# Patient Record
Sex: Female | Born: 1980
Health system: Southern US, Community
[De-identification: ages and names within clinical notes are randomized; demographics above are authoritative.]

## PROBLEM LIST (undated history)

## (undated) DIAGNOSIS — J42 Unspecified chronic bronchitis: Secondary | ICD-10-CM

## (undated) DIAGNOSIS — R0602 Shortness of breath: Secondary | ICD-10-CM

## (undated) DIAGNOSIS — F319 Bipolar disorder, unspecified: Secondary | ICD-10-CM

## (undated) DIAGNOSIS — D509 Iron deficiency anemia, unspecified: Secondary | ICD-10-CM

## (undated) DIAGNOSIS — F209 Schizophrenia, unspecified: Secondary | ICD-10-CM

## (undated) DIAGNOSIS — D649 Anemia, unspecified: Secondary | ICD-10-CM

## (undated) DIAGNOSIS — J45909 Unspecified asthma, uncomplicated: Secondary | ICD-10-CM

## (undated) DIAGNOSIS — Z9289 Personal history of other medical treatment: Secondary | ICD-10-CM

## (undated) HISTORY — PX: NO PAST SURGERIES: SHX2092

## (undated) HISTORY — PX: HYSTERECTOMY ABDOMINAL WITH SALPINGECTOMY: SHX6725

---

## 2013-04-06 ENCOUNTER — Emergency Department (HOSPITAL_COMMUNITY): Payer: Self-pay

## 2013-04-06 ENCOUNTER — Inpatient Hospital Stay (HOSPITAL_COMMUNITY)
Admission: EM | Admit: 2013-04-06 | Discharge: 2013-04-08 | DRG: 761 | Disposition: A | Discharge status: Home / Self Care | Payer: Self-pay | Attending: Internal Medicine | Admitting: Internal Medicine

## 2013-04-06 ENCOUNTER — Encounter (HOSPITAL_COMMUNITY): Payer: Self-pay | Admitting: *Deleted

## 2013-04-06 DIAGNOSIS — N858 Other specified noninflammatory disorders of uterus: Secondary | ICD-10-CM

## 2013-04-06 DIAGNOSIS — J45909 Unspecified asthma, uncomplicated: Secondary | ICD-10-CM | POA: Diagnosis present

## 2013-04-06 DIAGNOSIS — D259 Leiomyoma of uterus, unspecified: Principal | ICD-10-CM | POA: Diagnosis present

## 2013-04-06 DIAGNOSIS — F319 Bipolar disorder, unspecified: Secondary | ICD-10-CM

## 2013-04-06 DIAGNOSIS — N920 Excessive and frequent menstruation with regular cycle: Secondary | ICD-10-CM | POA: Diagnosis present

## 2013-04-06 DIAGNOSIS — Z79899 Other long term (current) drug therapy: Secondary | ICD-10-CM

## 2013-04-06 DIAGNOSIS — R600 Localized edema: Secondary | ICD-10-CM

## 2013-04-06 DIAGNOSIS — M7989 Other specified soft tissue disorders: Secondary | ICD-10-CM

## 2013-04-06 DIAGNOSIS — D500 Iron deficiency anemia secondary to blood loss (chronic): Secondary | ICD-10-CM | POA: Diagnosis present

## 2013-04-06 DIAGNOSIS — A599 Trichomoniasis, unspecified: Secondary | ICD-10-CM

## 2013-04-06 DIAGNOSIS — D509 Iron deficiency anemia, unspecified: Secondary | ICD-10-CM

## 2013-04-06 DIAGNOSIS — R140 Abdominal distension (gaseous): Secondary | ICD-10-CM

## 2013-04-06 DIAGNOSIS — R599 Enlarged lymph nodes, unspecified: Secondary | ICD-10-CM | POA: Diagnosis present

## 2013-04-06 DIAGNOSIS — N852 Hypertrophy of uterus: Secondary | ICD-10-CM | POA: Diagnosis present

## 2013-04-06 HISTORY — DX: Iron deficiency anemia, unspecified: D50.9

## 2013-04-06 HISTORY — DX: Schizophrenia, unspecified: F20.9

## 2013-04-06 HISTORY — DX: Anemia, unspecified: D64.9

## 2013-04-06 HISTORY — DX: Unspecified asthma, uncomplicated: J45.909

## 2013-04-06 HISTORY — DX: Shortness of breath: R06.02

## 2013-04-06 HISTORY — DX: Bipolar disorder, unspecified: F31.9

## 2013-04-06 HISTORY — DX: Unspecified chronic bronchitis: J42

## 2013-04-06 HISTORY — DX: Personal history of other medical treatment: Z92.89

## 2013-04-06 LAB — URINALYSIS, ROUTINE W REFLEX MICROSCOPIC
Bilirubin Urine: NEGATIVE
Glucose, UA: NEGATIVE mg/dL
Hgb urine dipstick: NEGATIVE
Ketones, ur: NEGATIVE mg/dL
Protein, ur: NEGATIVE mg/dL

## 2013-04-06 LAB — URINE MICROSCOPIC-ADD ON

## 2013-04-06 LAB — POCT PREGNANCY, URINE: Preg Test, Ur: NEGATIVE

## 2013-04-06 NOTE — ED Provider Notes (Signed)
CSN: [id number]     Arrival date & time 04/06/13  2133 History   First MD Initiated Contact with Patient 04/06/13 2153     Chief Complaint  Patient presents with  . Leg Swelling   (Consider location/radiation/quality/duration/timing/severity/associated sxs/prior Treatment) HPI Comments: 89 y F with PMH of bipolar and asthma, here with leg swelling for the past 3 days.  No change in urination. No fevers, chills, SOB, cough, abd pain, n/v, diarrhea.  Patient is a 32 y.o. female presenting with leg pain. The history is provided by the patient.  Leg Pain Location:  Leg Leg location:  L lower leg and R lower leg Chronicity:  New Prior injury to area:  No Worsened by:  Nothing tried Associated symptoms: no back pain, no fever, no muscle weakness, no neck pain, no stiffness, no swelling and no tingling     Past Medical History  Diagnosis Date  . Asthma   . Bronchitis    History reviewed. No pertinent past surgical history. History reviewed. No pertinent family history. History  Substance Use Topics  . Smoking status: Never Smoker   . Smokeless tobacco: Not on file  . Alcohol Use: No   OB History   Grav Para Term Preterm Abortions TAB SAB Ect Mult Living                 Review of Systems  Constitutional: Negative for fever and chills.  HENT: Negative for congestion, sore throat, trouble swallowing, neck pain and neck stiffness.   Respiratory: Negative for cough and shortness of breath.   Cardiovascular: Negative for chest pain.  Gastrointestinal: Negative for nausea, vomiting, abdominal pain and diarrhea.  Endocrine: Negative for polydipsia and polyphagia.  Genitourinary: Negative for dysuria, frequency, vaginal bleeding and vaginal discharge.  Musculoskeletal: Negative for back pain, joint swelling and stiffness.  Skin: Negative for pallor and rash.  Neurological: Negative for syncope, light-headedness and headaches.  Hematological: Negative for adenopathy. Does not  bruise/bleed easily.  Psychiatric/Behavioral: Negative for suicidal ideas and self-injury.  All other systems reviewed and are negative.    Allergies  Review of patient's allergies indicates no known allergies.  Home Medications  No current outpatient prescriptions on file. BP 109/58  Pulse 82  Temp(Src) 98.6 F (37 C) (Oral)  Resp 20  SpO2 100%  LMP 03/23/2013 Physical Exam  Vitals reviewed. Constitutional: She is oriented to person, place, and time. She appears well-developed and well-nourished.  HENT:  Right Ear: External ear normal.  Left Ear: External ear normal.  Mouth/Throat: No oropharyngeal exudate.  Eyes: Conjunctivae and EOM are normal. Pupils are equal, round, and reactive to light.  Neck: Normal range of motion. Neck supple.  Cardiovascular: Normal rate, regular rhythm, normal heart sounds and intact distal pulses.  Exam reveals no gallop and no friction rub.   No murmur heard. Pulmonary/Chest: Effort normal. She has wheezes (occasional).  Abdominal: Soft. Bowel sounds are normal. She exhibits distension and mass (palpable mass in RLQ). There is no tenderness. There is no rebound and no guarding.  Musculoskeletal: Normal range of motion. She exhibits edema (2+ bilateral LEs).  Neurological: She is alert and oriented to person, place, and time.  Skin: Skin is warm and dry. No rash noted. She is not diaphoretic.  Psychiatric: She has a normal mood and affect.    ED Course  Procedures (including critical care time) Labs Review Labs Reviewed  URINALYSIS, ROUTINE W REFLEX MICROSCOPIC - Abnormal; Notable for the following:    APPearance CLOUDY (*)  Leukocytes, UA MODERATE (*)    All other components within normal limits  CBC WITH DIFFERENTIAL - Abnormal; Notable for the following:    RBC 2.84 (*)    Hemoglobin 4.6 (*)    HCT 17.3 (*)    MCV 60.9 (*)    MCH 16.2 (*)    MCHC 26.6 (*)    RDW 24.2 (*)    Platelets 769 (*)    Eosinophils Relative 8 (*)     All other components within normal limits  COMPREHENSIVE METABOLIC PANEL - Abnormal; Notable for the following:    Potassium 3.4 (*)    Albumin 3.1 (*)    Alkaline Phosphatase 32 (*)    Total Bilirubin 0.1 (*)    All other components within normal limits  PRO B NATRIURETIC PEPTIDE - Abnormal; Notable for the following:    Pro B Natriuretic peptide (BNP) 131.1 (*)    All other components within normal limits  URINE MICROSCOPIC-ADD ON - Abnormal; Notable for the following:    Squamous Epithelial / LPF FEW (*)    Bacteria, UA FEW (*)    All other components within normal limits  RETICULOCYTES - Abnormal; Notable for the following:    RBC. 2.86 (*)    All other components within normal limits  URINE CULTURE  PROTIME-INR  VITAMIN B12  FOLATE  IRON AND TIBC  FERRITIN  CBC  POCT PREGNANCY, URINE  OCCULT BLOOD, POC DEVICE  TYPE AND SCREEN  PREPARE RBC (CROSSMATCH)  ABO/RH   Imaging Review Ct Abdomen Pelvis Wo Contrast  04/07/2013   *RADIOLOGY REPORT*  Clinical Data: Leg swelling.  Abdominal distention.  CT ABDOMEN AND PELVIS WITHOUT CONTRAST  Technique:  Multidetector CT imaging of the abdomen and pelvis was performed following the standard protocol without intravenous contrast.  Comparison: Abdominal radiograph 04/06/2013  Findings: Lung bases:  The lung bases are clear.  Abdomen/pelvis:  The uterus is massively enlarged and contains innumerable soft tissue masses within it.  These masses most likely reflect multiple fibroids.  Three of the fibroids are at least partially calcified, the one in the right aspect of the uterus accounts for the calcification seen on today's abdominal radiograph.  The uterus measures approximately 28 cm in sagittal length, and extends approximately 7 cm superior to the patient's umbilicus.  Uterus measures up to 19 cm transverse diameter and up to 17 cm AP diameter, and nearly occupies the entire lower abdomen and upper pelvis.  The largest uterine mass is within  the inferior uterus and measures up to 9.7 x 11 centimeters.  Fluid density oval mass wit in the right posterior aspect of the uterus likely reflects a degenerating/infarcting fibroid, which can be a source of pain.  The enlarged uterus causes mass effect on the bowel loops, and likely causes mass effect upon the inferior vena cava and abdominal aorta.  It may be the cause of the patient's leg swelling. Due to the presence of mass in the absence of contrast, the abdominal aorta and vena cava not well visualized.  The liver, spleen, adrenal glands, pancreas, and kidneys show no abnormality on noncontrast imaging.  Urinary bladder is unremarkable.  Inguinal lymph nodes are mildly prominent bilaterally, measuring up to 14 x 14 mm on the right and 16 x 15 mm on the left.  Evaluation for intra-abdominal lymphadenopathy is limited given the lack of contrast and the large uterine size.  No acute osseous abnormality.  IMPRESSION:  1.  Massively enlarged uterus (approximately 28 x  19 x 17 cm) contains multiple masses, likel fibroids.  Given the large size of uterus and large size of the masses, sarcomatous degeneration of fibroids cannot be excluded.  The uterus extends 7 cm cephalad to the umbilicus and causes mass effect on the abdominal bowel loops, likely on the aorta and inferior vena cava, and may be causing the patient's leg swelling.  Gynecology consultation is recommended. 2.  Prominent inguinal lymph nodes bilaterally.   Original Report Authenticated By: Britta Mccreedy, M.D.   Ct Abdomen Pelvis Wo Contrast  04/06/2013   [name]     04/06/2013 11:40 PM Per Dr. Oleh Genin, ER resident, this exam was ordered due to  the pt having some abdominal distention and feels the pt has a  palpable mass.  The dr states that this is a screening and that  doing the exam without IV and/or oral contrast will be  sufficient.  Dg Chest 2 View  04/06/2013   *RADIOLOGY REPORT*  Clinical Data: Leg swelling  CHEST - 2 VIEW   Comparison: None.  Findings: Heart size appears upper normal to borderline enlarged. There is mild central pulmonary vascular congestion.  The lungs are clear.  Negative for airspace disease, effusion, or pneumothorax. Trachea is midline.  Visualized bowel gas pattern is normal.  No acute osseous abnormality.  IMPRESSION: Borderline cardiomegaly and probable mild central pulmonary vascular congestion.   Original Report Authenticated By: Britta Mccreedy, M.D.   Dg Abd 1 View  04/06/2013   *RADIOLOGY REPORT*  Clinical Data: Leg swelling  ABDOMEN - 1 VIEW  Comparison: Chest radiograph 04/06/2013  Findings: Relative paucity of bowel gas in the central abdomen, with loss of the normal psoas margins.  Cannot exclude ascites or abdominal/pelvic mass as the cause of these findings.  Additionally, there is an oval-shaped peripherallycalcified structure in the right lower quadrant that measures 7 x 6.3 cm.  No evidence of bowel obstruction.  IMPRESSION: Peripherally calcified right lower quadrant mass of uncertain etiology.  Paucity of bowel loops in the central abdomen, for which ascites or soft tissue mass cannot be excluded.  Suggest further evaluation with CT of the abdomen pelvis with IV and oral contrast.   Original Report Authenticated By: Britta Mccreedy, M.D.    MDM   89 y F with PMH of bipolar and asthma, here with leg swelling for the past 3 days.  No change in urination. No fevers, chills, SOB, cough, abd pain, n/v, diarrhea.  Afebrile.  VSS. Well appearing.  +abd distention, but soft, NT, no rebound.  No CVAT.  2+ pitting edema to the knees bilaterally.  Diff Dx: AKI, CHF, hypoalbuminemia, benign edema, abd mass, DVT.  CBC, CMP, bnp, XR chest, CT abd/pelvis  1:02 AM  Hgb 4.  FOBT negative, brown stool.  No vaginal bleeding, but she does report a long hx of heavy periods.  No signs of active bleeding.  She seems hemodynamically compensated.  Typed and crossed for 2U, no need for emergent transfusion.  KUB  had a calcified mass.  Subsequent CT abd/pelvis demonstrated an enlarged uterus and numerous large calcified fibroids.  Her lower extremity edema is felt to be 2/2 IVC compression. Medicine consulted for admission.   Clinical Impression: 1. Uterine mass   2. Leg swelling   3. Abdominal distention   4. Microcytic anemia      I have discussed the results, Dx and Tx plan. They understand and agree with plan for admission.  Exam unchanged at admission.  Pt seen in conjunction with Dr. Manus Gunning.  Reine Just. Beverely Pace, MD Emergency Medicine PGY-III [phone]   Oleh Genin, MD 04/07/13 [phone]

## 2013-04-06 NOTE — Procedures (Signed)
Per Dr. Oleh Genin, ER resident, this exam was ordered due to the pt having some abdominal distention and feels the pt has a palpable mass.  The dr states that this is a screening and that doing the exam without IV and/or oral contrast will be sufficient.

## 2013-04-06 NOTE — ED Notes (Signed)
Pt states she noticed her ankles were swollen when her socks felt tight. Bilateral ankle edema, non pitting. No wheeping noted. BLE pedal pulses 1+. Bilateral breath sounds clear. Pt abdomen distended. Pt states last BM was a few days ago. Bowel sounds active.

## 2013-04-06 NOTE — ED Notes (Addendum)
Pt states bilateral leg swelling for the past couple of days. 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 Pt denies decrease in urine output or known medical history for cause of swelling. Pt abdomen swelling and appears pregnant but states her LMP was 2 weeks ago.

## 2013-04-07 ENCOUNTER — Encounter (HOSPITAL_COMMUNITY): Payer: Self-pay | Admitting: Internal Medicine

## 2013-04-07 ENCOUNTER — Other Ambulatory Visit: Payer: Self-pay | Admitting: Obstetrics & Gynecology

## 2013-04-07 DIAGNOSIS — R0609 Other forms of dyspnea: Secondary | ICD-10-CM

## 2013-04-07 DIAGNOSIS — D509 Iron deficiency anemia, unspecified: Secondary | ICD-10-CM | POA: Diagnosis present

## 2013-04-07 DIAGNOSIS — N9489 Other specified conditions associated with female genital organs and menstrual cycle: Secondary | ICD-10-CM

## 2013-04-07 DIAGNOSIS — N858 Other specified noninflammatory disorders of uterus: Secondary | ICD-10-CM | POA: Diagnosis present

## 2013-04-07 DIAGNOSIS — R141 Gas pain: Secondary | ICD-10-CM

## 2013-04-07 DIAGNOSIS — F319 Bipolar disorder, unspecified: Secondary | ICD-10-CM | POA: Diagnosis present

## 2013-04-07 DIAGNOSIS — R0989 Other specified symptoms and signs involving the circulatory and respiratory systems: Secondary | ICD-10-CM

## 2013-04-07 DIAGNOSIS — R609 Edema, unspecified: Secondary | ICD-10-CM

## 2013-04-07 DIAGNOSIS — A599 Trichomoniasis, unspecified: Secondary | ICD-10-CM | POA: Diagnosis present

## 2013-04-07 DIAGNOSIS — Z9289 Personal history of other medical treatment: Secondary | ICD-10-CM

## 2013-04-07 DIAGNOSIS — R600 Localized edema: Secondary | ICD-10-CM | POA: Diagnosis present

## 2013-04-07 HISTORY — DX: Personal history of other medical treatment: Z92.89

## 2013-04-07 LAB — COMPREHENSIVE METABOLIC PANEL
ALT: 7 U/L (ref 0–35)
Alkaline Phosphatase: 32 U/L — ABNORMAL LOW (ref 39–117)
CO2: 25 mEq/L (ref 19–32)
Calcium: 8.4 mg/dL (ref 8.4–10.5)
GFR calc Af Amer: >90 mL/min (ref >90)
GFR calc non Af Amer: >90 mL/min (ref >90)
Glucose, Bld: 94 mg/dL (ref 70–99)
Sodium: 137 mEq/L (ref 135–145)

## 2013-04-07 LAB — CBC
HCT: 31.3 % — ABNORMAL LOW (ref 36.0–46.0)
Hemoglobin: 6.7 g/dL — CL (ref 12.0–15.0)
MCH: 16 pg — ABNORMAL LOW (ref 26.0–34.0)
MCH: 19.8 pg — ABNORMAL LOW (ref 26.0–34.0)
MCHC: 29.4 g/dL — ABNORMAL LOW (ref 30.0–36.0)
MCHC: 31.6 g/dL (ref 30.0–36.0)
Platelets: 766 10*3/uL — ABNORMAL HIGH (ref 150–400)
Platelets: 705 10*3/uL — ABNORMAL HIGH (ref 150–400)
Platelets: 720 10*3/uL — ABNORMAL HIGH (ref 150–400)
RBC: 2.68 MIL/uL — ABNORMAL LOW (ref 3.87–5.11)
RDW: 23.9 % — ABNORMAL HIGH (ref 11.5–15.5)
RDW: 27.1 % — ABNORMAL HIGH (ref 11.5–15.5)
RDW: 25.1 % — ABNORMAL HIGH (ref 11.5–15.5)

## 2013-04-07 LAB — CBC WITH DIFFERENTIAL/PLATELET
Basophils Relative: 1 % (ref 0–1)
Eosinophils Absolute: 0.4 10*3/uL (ref 0.0–0.7)
HCT: 17.3 % — ABNORMAL LOW (ref 36.0–46.0)
Hemoglobin: 4.6 g/dL — CL (ref 12.0–15.0)
Lymphocytes Relative: 32 % (ref 12–46)
Lymphs Abs: 1.6 10*3/uL (ref 0.7–4.0)
MCH: 16.2 pg — ABNORMAL LOW (ref 26.0–34.0)
MCHC: 26.6 g/dL — ABNORMAL LOW (ref 30.0–36.0)
MCV: 60.9 fL — ABNORMAL LOW (ref 78.0–100.0)
Neutro Abs: 2.5 10*3/uL (ref 1.7–7.7)

## 2013-04-07 LAB — PROTIME-INR
INR: 1.11 (ref 0.00–1.49)
Prothrombin Time: 14.1 seconds (ref 11.6–15.2)

## 2013-04-07 LAB — OCCULT BLOOD, POC DEVICE: Fecal Occult Bld: NEGATIVE

## 2013-04-07 LAB — ABO/RH: ABO/RH(D): A POS

## 2013-04-07 LAB — FERRITIN: Ferritin: 1 ng/mL — ABNORMAL LOW (ref 10–291)

## 2013-04-07 LAB — PREPARE RBC (CROSSMATCH)

## 2013-04-07 LAB — RETICULOCYTES
RBC.: 2.86 MIL/uL — ABNORMAL LOW (ref 3.87–5.11)
Retic Count, Absolute: 34.3 10*3/uL (ref 19.0–186.0)

## 2013-04-07 LAB — IRON AND TIBC: Iron: <10 ug/dL — ABNORMAL LOW (ref 42–135)

## 2013-04-07 LAB — FOLATE: Folate: >20 ng/mL

## 2013-04-07 MED ORDER — LURASIDONE HCL 40 MG PO TABS
40.0000 mg | ORAL_TABLET | Freq: Every day | ORAL | Status: DC
  Administered 2013-04-07 – 2013-04-08 (×2): 40 mg via ORAL
  Filled 2013-04-07 (×3): qty 1

## 2013-04-07 MED ORDER — PNEUMOCOCCAL VAC POLYVALENT 25 MCG/0.5ML IJ INJ
0.5000 mL | INJECTION | INTRAMUSCULAR | Status: AC
  Administered 2013-04-08: 11:00:00 0.5 mL via INTRAMUSCULAR
  Filled 2013-04-07: qty 0.5

## 2013-04-07 MED ORDER — ALBUTEROL SULFATE HFA 108 (90 BASE) MCG/ACT IN AERS: 2.0000 | INHALATION_SPRAY | Freq: Four times a day (QID) | RESPIRATORY_TRACT | Status: DC | PRN

## 2013-04-07 MED ORDER — METRONIDAZOLE 500 MG PO TABS
2000.0000 mg | ORAL_TABLET | Freq: Once | ORAL | Status: AC
  Administered 2013-04-07: 2000 mg via ORAL
  Filled 2013-04-07 (×2): qty 4

## 2013-04-07 MED ORDER — SODIUM CHLORIDE 0.9 % IV SOLN: 250.0000 mL | INTRAVENOUS | Status: DC | PRN

## 2013-04-07 MED ORDER — SODIUM CHLORIDE 0.9 % IJ SOLN: 3.0000 mL | INTRAMUSCULAR | Status: DC | PRN

## 2013-04-07 MED ORDER — SODIUM CHLORIDE 0.9 % IJ SOLN
3.0000 mL | Freq: Two times a day (BID) | INTRAMUSCULAR | Status: DC
  Administered 2013-04-07 – 2013-04-08 (×3): 3 mL via INTRAVENOUS

## 2013-04-07 MED ORDER — FERUMOXYTOL INJECTION 510 MG/17 ML
510.0000 mg | Freq: Once | INTRAVENOUS | Status: AC
  Administered 2013-04-07: 13:00:00 510 mg via INTRAVENOUS
  Filled 2013-04-07: qty 17

## 2013-04-07 MED ORDER — SODIUM CHLORIDE 0.9 % IJ SOLN
3.0000 mL | Freq: Two times a day (BID) | INTRAMUSCULAR | Status: DC
  Administered 2013-04-07 – 2013-04-08 (×2): 3 mL via INTRAVENOUS

## 2013-04-07 NOTE — ED Notes (Signed)
Critical hemoglobin 4.6 reported to Dr. Beverely Pace

## 2013-04-07 NOTE — ED Notes (Signed)
Hand off report called to [facility].

## 2013-04-07 NOTE — Progress Notes (Signed)
*  PRELIMINARY RESULTS* Echocardiogram 2D Echocardiogram has been performed.  Sherri Shaw 04/07/2013, 10:41 AM

## 2013-04-07 NOTE — Progress Notes (Signed)
Utilization Review Completed.   Faydra Korman, RN, BSN Nurse Case Manager  [phone]  

## 2013-04-07 NOTE — ED Notes (Signed)
Blood transfusing without difficulty. Verified with JLO RN. No signs or symptoms of a transfusion reaction present.

## 2013-04-07 NOTE — H&P (Signed)
Date: 04/07/2013               Patient Name:  Sherri Shaw MRN: [id number]  DOB: 07/25/1981 Age / Sex: 32 y.o., female   PCP: No Pcp Per Patient         Medical Service: Internal Medicine Teaching Service         Attending Physician: Dr. Bonnetta Barry att. providers found    First Contact: Dr. Jannifer Hick, MD Pager: [phone]  Second Contact: Dr. Charlsie Merles, MD Pager: [phone]       After Hours (After 5p/  First Contact Pager: [phone]  weekends / holidays): Second Contact Pager: [phone]   Chief Complaint: Leg Swelling  History of Present Illness: Sherri Shaw is a 32 y.o. female with a history of asthma and bipolar d/o who comes to the ED with a CC of leg swelling. The interview was made challenging by the patients child like affect and lack of insight. The history was corroborated with her sister. The patient reports a couple day history of leg swelling, that was much worse today. The patient notcied this morning that she could not get her shoes on. She has an associated symptom of 4 years of abdominal pain and progressive distension. She admits exertional dyspnea and generalized weakness. She admits to heavy menstrual flow 1 x per month for 7 days. She denies vaginal bleeding, hematachezia. She denies N/V, hematemesis, constipation, diarrhea, SOB, chest pain. She denies alcohol, drug use, tobacco use. She denies sexual activity.  She reports an extensive family history of stomach tumors, but does not know much about it.  Meds: No current facility-administered medications for this encounter.   Current Outpatient Prescriptions  Medication Sig Dispense Refill  . albuterol (PROVENTIL HFA;VENTOLIN HFA) 108 (90 BASE) MCG/ACT inhaler Inhale 2 puffs into the lungs every 6 (six) hours as needed for wheezing.      Marland Kitchen lurasidone (LATUDA) 40 MG TABS tablet Take 40 mg by mouth daily with breakfast.        Allergies: Allergies as of 04/06/2013  . (No Known Allergies)   Past Medical History    Diagnosis Date  . Asthma   . Bronchitis   . Bipolar disorder    History reviewed. No pertinent past surgical history. Family History  Problem Relation Age of Onset  . Hypertension Mother   . Diabetes Mother   . Asthma Mother   . Asthma Brother    History   Social History  . Marital Status: Single    Spouse Name: N/A    Number of Children: N/A  . Years of Education: Coll   Occupational History  . Not on file.   Social History Main Topics  . Smoking status: Never Smoker   . Smokeless tobacco: Not on file  . Alcohol Use: No  . Drug Use: No  . Sexual Activity: Not Currently   Other Topics Concern  . 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  Physical Exam: Blood pressure 111/63, pulse 82, temperature 98.1 F (36.7 C), temperature source Oral, resp. rate 16, last menstrual period 03/23/2013, SpO2 91.00%. Physical Exam  Constitutional: She appears well-developed and well-nourished. No distress.  HENT:  Head: Normocephalic and atraumatic.  Mouth/Throat: Oropharynx is clear and moist. No oropharyngeal exudate.  Eyes: EOM are normal. Pupils are equal, round, and reactive to  light.  Cardiovascular: Normal rate, regular rhythm, normal heart sounds and intact distal pulses.  Exam reveals no gallop and no friction rub.   No murmur heard. Pulmonary/Chest: Effort normal.  Mild wheezing bil.  Abdominal: Bowel sounds are normal. She exhibits distension. There is no tenderness. There is no rebound and no guarding.  Distended, multiple masses felt in the RUQ, RLQ. Nontender to palpation, no guarding or rebound.  Musculoskeletal: She exhibits no edema.  2+ LE edema bilateral  Neurological: She is alert.  Skin: She is not diaphoretic.  Psychiatric:  Child like affect, poor comprehension and poor insight.     Lab results: Basic  Metabolic Panel:  Recent Labs  16/10/96 2322  NA 137  K 3.4*  CL 104  CO2 25  GLUCOSE 94  BUN 8  CREATININE 0.69  CALCIUM 8.4   Liver Function Tests:  Recent Labs  04/06/13 2322  AST 14  ALT 7  ALKPHOS 32*  BILITOT 0.1*  PROT 7.1  ALBUMIN 3.1*   CBC:  Recent Labs  04/06/13 2322 04/07/13 0106  WBC 5.1 5.8  NEUTROABS 2.5  --   HGB 4.6* 4.3*  HCT 17.3* 16.3*  MCV 60.9* 60.8*  PLT 769* 766*   BNP:  Recent Labs  04/06/13 2322  PROBNP 131.1*   Anemia Panel:  Recent Labs  04/07/13 0005  RETICCTPCT 1.2   Coagulation:  Recent Labs  04/07/13 0005  LABPROT 14.1  INR 1.11   Urinalysis:  Recent Labs  04/06/13 2155  COLORURINE YELLOW  LABSPEC 1.020  PHURINE 7.5  GLUCOSEU NEGATIVE  HGBUR NEGATIVE  BILIRUBINUR NEGATIVE  KETONESUR NEGATIVE  PROTEINUR NEGATIVE  UROBILINOGEN 1.0  NITRITE NEGATIVE  LEUKOCYTESUR MODERATE*   Imaging results:  Ct Abdomen Pelvis Wo Contrast  04/07/2013   *RADIOLOGY REPORT*  Clinical Data: Leg swelling.  Abdominal distention.  CT ABDOMEN AND PELVIS WITHOUT CONTRAST  Technique:  Multidetector CT imaging of the abdomen and pelvis was performed following the standard protocol without intravenous contrast.  Comparison: Abdominal radiograph 04/06/2013  Findings: Lung bases:  The lung bases are clear.  Abdomen/pelvis:  The uterus is massively enlarged and contains innumerable soft tissue masses within it.  These masses most likely reflect multiple fibroids.  Three of the fibroids are at least partially calcified, the one in the right aspect of the uterus accounts for the calcification seen on today's abdominal radiograph.  The uterus measures approximately 28 cm in sagittal length, and extends approximately 7 cm superior to the patient's umbilicus.  Uterus measures up to 19 cm transverse diameter and up to 17 cm AP diameter, and nearly occupies the entire lower abdomen and upper pelvis.  The largest uterine mass is within the inferior  uterus and measures up to 9.7 x 11 centimeters.  Fluid density oval mass wit in the right posterior aspect of the uterus likely reflects a degenerating/infarcting fibroid, which can be a source of pain.  The enlarged uterus causes mass effect on the bowel loops, and likely causes mass effect upon the inferior vena cava and abdominal aorta.  It may be the cause of the patient's leg swelling. Due to the presence of mass in the absence of contrast, the abdominal aorta and vena cava not well visualized.  The liver, spleen, adrenal glands, pancreas, and kidneys show no abnormality on noncontrast imaging.  Urinary bladder is unremarkable.  Inguinal lymph nodes are mildly prominent bilaterally, measuring up to 14 x 14 mm on the right and 16 x 15 mm on  the left.  Evaluation for intra-abdominal lymphadenopathy is limited given the lack of contrast and the large uterine size.  No acute osseous abnormality.  IMPRESSION:  1.  Massively enlarged uterus (approximately 28 x 19 x 17 cm) contains multiple masses, likel fibroids.  Given the large size of uterus and large size of the masses, sarcomatous degeneration of fibroids cannot be excluded.  The uterus extends 7 cm cephalad to the umbilicus and causes mass effect on the abdominal bowel loops, likely on the aorta and inferior vena cava, and may be causing the patient's leg swelling.  Gynecology consultation is recommended. 2.  Prominent inguinal lymph nodes bilaterally.   Original Report Authenticated By: Britta Mccreedy, M.D.   Ct Abdomen Pelvis Wo Contrast  04/06/2013   [name]     04/06/2013 11:40 PM Per Dr. Oleh Genin, ER resident, this exam was ordered due to  the pt having some abdominal distention and feels the pt has a  palpable mass.  The dr states that this is a screening and that  doing the exam without IV and/or oral contrast will be  sufficient.  Dg Chest 2 View  04/06/2013   *RADIOLOGY REPORT*  Clinical Data: Leg swelling  CHEST - 2 VIEW  Comparison:  None.  Findings: Heart size appears upper normal to borderline enlarged. There is mild central pulmonary vascular congestion.  The lungs are clear.  Negative for airspace disease, effusion, or pneumothorax. Trachea is midline.  Visualized bowel gas pattern is normal.  No acute osseous abnormality.  IMPRESSION: Borderline cardiomegaly and probable mild central pulmonary vascular congestion.   Original Report Authenticated By: Britta Mccreedy, M.D.   Dg Abd 1 View  04/06/2013   *RADIOLOGY REPORT*  Clinical Data: Leg swelling  ABDOMEN - 1 VIEW  Comparison: Chest radiograph 04/06/2013  Findings: Relative paucity of bowel gas in the central abdomen, with loss of the normal psoas margins.  Cannot exclude ascites or abdominal/pelvic mass as the cause of these findings.  Additionally, there is an oval-shaped peripherallycalcified structure in the right lower quadrant that measures 7 x 6.3 cm.  No evidence of bowel obstruction.  IMPRESSION: Peripherally calcified right lower quadrant mass of uncertain etiology.  Paucity of bowel loops in the central abdomen, for which ascites or soft tissue mass cannot be excluded.  Suggest further evaluation with CT of the abdomen pelvis with IV and oral contrast.   Original Report Authenticated By: Britta Mccreedy, M.D.   Assessment & Plan by Problem: Principal Problem:   Microcytic anemia Active Problems:   Uterine mass   Trichomonas   Bipolar disorder  # Microcytic Anemia The patient denies any evidence of bleeding aside from monthly heavy periods. The patients Hg is 4.3 and she is hemodynamically stable. The anemia is may represent chronic anemia due to chronic blood loss 2/2 her uterine masses. As the retic index of 0.17 was innappropriatley low, iron deficiency is likely, but thalassemia remains a possibility. Hemolysis is unlikely as ho blood by UA and no elevation of T Bili.  - x-fuse 2 U PRBC and recheck Hg in AM - F/U Iron Studies  # LE swelling The patients LE is  likely 2/2 compression of IVC by uterine masses. However, there is borderline cardiomegaly with mild pulmonary vascular congestion. DVT is unlikely given bil (Wells Risk of DVT = 1). - F/U 2D Echo  #Uterine Mass c/b bil LE swelling The patients uterine masses require further evaluation as they may be benign or malignant. Furthermore, the  mass effect produces is causing IVC compression leading to LE swelling. - Plan to consult Gyn in AM  # Trichomonas - 2 g Flagyl once  # Bipolar - Continue home Latuda  # Asthma - Continue home albuterol Dispo: Disposition is deferred at this time, awaiting improvement of current medical problems. Anticipated discharge in approximately 1-2 day(s).   The patient does not have a current PCP (No Pcp Per Patient) and does need an [facility] hospital follow-up appointment after discharge.  The patient does not know have transportation limitations that hinder transportation to clinic appointments.  Signed: Pleas Koch, MD 04/07/2013, 1:47 AM

## 2013-04-07 NOTE — Progress Notes (Signed)
The patient arrived to 4E16 from the ED at 0300.  She was oriented to the unit and placed on telemetry.  VS were taken and the patient was assessed.  She was A&Ox4 and did not have any complaints of pain at this time. A unit of blood was already being transfused when she arrived to the unit.  Her blood consent that was obtained in the ED was placed in the chart.  The call bell was explained to the patient and placed within reach.

## 2013-04-07 NOTE — ED Provider Notes (Signed)
I saw and evaluated the patient, reviewed the resident's note and I agree with the findings and plan. If applicable, I agree with the resident's interpretation of the EKG.  If applicable, I was present for critical portions of any procedures performed.  Bilateral leg swelling x 3days.  No chest pain or SOB. No distress, CTAB. Distended abdomen with multiple palpable masses. Nontender. +1 pretibial edema bilaterally, intact DP and PT pulses Anemia with massive uterus and fibroids. Vitals stable, no indication for emergent transfusion.  Glynn Octave, MD 04/07/13 [phone]

## 2013-04-07 NOTE — Progress Notes (Signed)
Subjective: Patient feels well this morning. She has no complaints other than the BLE edema and her BLE feeling "tight" when she walks. Reports that she first noticed her abdomen increasing in girth in 2010, has been stable in size for approx 4 years. She also noticed her periods became heavier and last longer (increased in length from 5 days on avg. To 8-10 days on avg). She has never been evaluated by OB/GYN. Denies SOB, CP, N/V, abd pain, lightheadedness.  Objective: Vital signs in last 24 hours: Filed Vitals:   04/07/13 1432 04/07/13 1504 04/07/13 1602 04/07/13 1630  BP: 93/47 97/50 105/55 103/40  Pulse: 73 72 72 74  Temp: 98.5 F (36.9 C) 98.1 F (36.7 C) 98.7 F (37.1 C) 98.7 F (37.1 C)  TempSrc: Oral Oral Oral Oral  Resp: 14 16 16 16   Height:      Weight:      SpO2: 98%      Weight change:   Intake/Output Summary (Last 24 hours) at 04/07/13 1713 Last data filed at 04/07/13 1700  Gross per 24 hour  Intake  627.5 ml  Output      0 ml  Net  627.5 ml   Physical Exam General: alert, cooperative, and in no apparent distress; answers questions with brief responses, but appropriately HEENT: pupils equal round and reactive to light, vision grossly intact, oropharynx clear and non-erythematous  Neck: supple, no lymphadenopathy, JVD, or carotid bruits Lungs: clear to ascultation bilaterally, normal work of respiration, no wheezes, rales, ronchi Heart: regular rate and rhythm, no murmurs, gallops, or rubs Abdomen: firm, distended, non-tender, with palpable masses to RUQ and RLQ, normal bowel sounds Extremities: warm extremities, 2+ pitting edema to BLE, no skin changes to BLE Neurologic: alert & oriented X3, strength and sensation grossly intact; affect is blunted  Lab Results: Basic Metabolic Panel:  Recent Labs Lab 04/06/13 2322  NA 137  K 3.4*  CL 104  CO2 25  GLUCOSE 94  BUN 8  CREATININE 0.69  CALCIUM 8.4   Liver Function Tests:  Recent Labs Lab  04/06/13 2322  AST 14  ALT 7  ALKPHOS 32*  BILITOT 0.1*  PROT 7.1  ALBUMIN 3.1*   CBC:  Recent Labs Lab 04/06/13 2322 04/07/13 0106 04/07/13 1145  WBC 5.1 5.8 6.3  NEUTROABS 2.5  --   --   HGB 4.6* 4.3* 6.7*  HCT 17.3* 16.3* 22.8*  MCV 60.9* 60.8* 67.5*  PLT 769* 766* 705*   BNP:  Recent Labs Lab 04/06/13 2322  PROBNP 131.1*   D-Dimer:  Recent Labs Lab 04/07/13 1145  DDIMER 1.10*   Coagulation:  Recent Labs Lab 04/07/13 0005  LABPROT 14.1  INR 1.11   Anemia Panel:  Recent Labs Lab 04/07/13 0005  VITAMINB12 537  FOLATE >20.0  FERRITIN 1*  TIBC Not calculated due to Iron <10.  IRON <10*  RETICCTPCT 1.2   Urinalysis:  Recent Labs Lab 04/06/13 2155  COLORURINE YELLOW  LABSPEC 1.020  PHURINE 7.5  GLUCOSEU NEGATIVE  HGBUR NEGATIVE  BILIRUBINUR NEGATIVE  KETONESUR NEGATIVE  PROTEINUR NEGATIVE  UROBILINOGEN 1.0  NITRITE NEGATIVE  LEUKOCYTESUR MODERATE*   Studies/Results: Ct Abdomen Pelvis Wo Contrast  04/07/2013   *RADIOLOGY REPORT*  Clinical Data: Leg swelling.  Abdominal distention.  CT ABDOMEN AND PELVIS WITHOUT CONTRAST  Technique:  Multidetector CT imaging of the abdomen and pelvis was performed following the standard protocol without intravenous contrast.  Comparison: Abdominal radiograph 04/06/2013  Findings: Lung bases:  The lung  bases are clear.  Abdomen/pelvis:  The uterus is massively enlarged and contains innumerable soft tissue masses within it.  These masses most likely reflect multiple fibroids.  Three of the fibroids are at least partially calcified, the one in the right aspect of the uterus accounts for the calcification seen on today's abdominal radiograph.  The uterus measures approximately 28 cm in sagittal length, and extends approximately 7 cm superior to the patient's umbilicus.  Uterus measures up to 19 cm transverse diameter and up to 17 cm AP diameter, and nearly occupies the entire lower abdomen and upper pelvis.  The  largest uterine mass is within the inferior uterus and measures up to 9.7 x 11 centimeters.  Fluid density oval mass wit in the right posterior aspect of the uterus likely reflects a degenerating/infarcting fibroid, which can be a source of pain.  The enlarged uterus causes mass effect on the bowel loops, and likely causes mass effect upon the inferior vena cava and abdominal aorta.  It may be the cause of the patient's leg swelling. Due to the presence of mass in the absence of contrast, the abdominal aorta and vena cava not well visualized.  The liver, spleen, adrenal glands, pancreas, and kidneys show no abnormality on noncontrast imaging.  Urinary bladder is unremarkable.  Inguinal lymph nodes are mildly prominent bilaterally, measuring up to 14 x 14 mm on the right and 16 x 15 mm on the left.  Evaluation for intra-abdominal lymphadenopathy is limited given the lack of contrast and the large uterine size.  No acute osseous abnormality.  IMPRESSION:  1.  Massively enlarged uterus (approximately 28 x 19 x 17 cm) contains multiple masses, likel fibroids.  Given the large size of uterus and large size of the masses, sarcomatous degeneration of fibroids cannot be excluded.  The uterus extends 7 cm cephalad to the umbilicus and causes mass effect on the abdominal bowel loops, likely on the aorta and inferior vena cava, and may be causing the patient's leg swelling.  Gynecology consultation is recommended. 2.  Prominent inguinal lymph nodes bilaterally.   Original Report Authenticated By: Britta Mccreedy, M.D.   Ct Abdomen Pelvis Wo Contrast  04/06/2013   [name]     04/06/2013 11:40 PM Per Dr. Oleh Genin, ER resident, this exam was ordered due to  the pt having some abdominal distention and feels the pt has a  palpable mass.  The dr states that this is a screening and that  doing the exam without IV and/or oral contrast will be  sufficient.  Dg Chest 2 View  04/06/2013   *RADIOLOGY REPORT*  Clinical  Data: Leg swelling  CHEST - 2 VIEW  Comparison: None.  Findings: Heart size appears upper normal to borderline enlarged. There is mild central pulmonary vascular congestion.  The lungs are clear.  Negative for airspace disease, effusion, or pneumothorax. Trachea is midline.  Visualized bowel gas pattern is normal.  No acute osseous abnormality.  IMPRESSION: Borderline cardiomegaly and probable mild central pulmonary vascular congestion.   Original Report Authenticated By: Britta Mccreedy, M.D.   Dg Abd 1 View  04/06/2013   *RADIOLOGY REPORT*  Clinical Data: Leg swelling  ABDOMEN - 1 VIEW  Comparison: Chest radiograph 04/06/2013  Findings: Relative paucity of bowel gas in the central abdomen, with loss of the normal psoas margins.  Cannot exclude ascites or abdominal/pelvic mass as the cause of these findings.  Additionally, there is an oval-shaped peripherallycalcified structure in the right lower quadrant that  measures 7 x 6.3 cm.  No evidence of bowel obstruction.  IMPRESSION: Peripherally calcified right lower quadrant mass of uncertain etiology.  Paucity of bowel loops in the central abdomen, for which ascites or soft tissue mass cannot be excluded.  Suggest further evaluation with CT of the abdomen pelvis with IV and oral contrast.   Original Report Authenticated By: Britta Mccreedy, M.D.   Medications: I have reviewed the patient's current medications. Scheduled Meds: . lurasidone  40 mg Oral Q breakfast  . sodium chloride  3 mL Intravenous Q12H  . sodium chloride  3 mL Intravenous Q12H   Continuous Infusions:  PRN Meds:.sodium chloride, albuterol, sodium chloride  Assessment/Plan:  # Microcytic Anemia  The patient denies any evidence of bleeding aside from monthly heavy periods. On admission her Hb was 4.3, but she was asymptomatic and hemodynamically stable. 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 However, there is borderline cardiomegaly with mild pulmonary vascular congestion. DVT is unlikely given bil (Wells Risk of DVT = 1), however D dimer is positive and we are unable to definitively rule out IVC thrombus.  - Echo 9/2- normal systolic function, normal Echo - MRI pelvis and abd w/ and w/o contrast to eval for IVC thrombus  #Uterine Mass c/b bil LE swelling  CT showed massively enlarged uterus w/ multiple masses likely representing fibroids, though sarcomatous degeneration was not ruled out. The mass effect of her uterus may be causing IVC compression leading to LE swelling.  - GYN consulted [phone])- I spoke with GYN and they recommend obtaining MRI abd/pelvis to eval for sarcomatous degeneration of patient's fibroids; additionally, they usually eval this type of uterine pathology in the outpatient setting as long as patient is medically stable; I also talked to radiology who stated that this MRI study would be helpful for evaluating for IVC thrombus as well  # Trichomonas  - 2 g Flagyl once   # Bipolar  - Continue home Latuda   # Asthma  - Continue home albuterol  Dispo: Disposition is deferred at this time, awaiting improvement of current medical problems.  Anticipated discharge in approximately 1 day(s).   The patient does not have a current PCP (No Pcp Per Patient) and does need an [facility] hospital follow-up appointment after discharge.  The patient does not have transportation limitations that hinder transportation to clinic appointments.  .Services Needed at time of discharge: Y = Yes, Blank = No PT:   OT:   RN:   Equipment:   Other:     LOS: 1 day   Windell Hummingbird, MD 04/07/2013, 5:13 PM

## 2013-04-07 NOTE — H&P (Signed)
INTERNAL MEDICINE TEACHING SERVICE Attending Admission Note  Date: 04/07/2013  Patient name: Sherri Shaw  Medical record number: [id number]  Date of birth: 1980-11-06    I have seen and evaluated Virgina Jock and discussed their care with the Residency Team.   32 yr old AAF w/ hx bipolar disorder and asthma presented due to worsening LE edema.  The patient has had progressive abdominal distention for years associated with her leg swelling. She states what made her come to the hospital was that she "couldn't walk" and was "in pain". Abdominal exam is significant for a palpable uterus with multiple masses. She has 2+ bilat pitting edema to her knees.  CT of her abdomen has multiple uterine masses with a massively enlarged uterus, as well as surrounding lymphadenopathy.  In addition, she was noted to have severe iron deficiency anemia, likely a result of menstrual losses.  She was transfused 2 Units of PRBCs. She  Consult Gyn at this time for recs. Concern is degeneration of fibroids into sarcomatous lesions. of Given her edema of LE, this could be due to IVC mass effect vs DVT.  Obtain a D-dimer, if positive, will need imaging of IVC and LE to r/o DVT.  Jonah Blue, DO, FACP Faculty [facility] Internal Medicine Residency Program 04/07/2013, 12:49 PM

## 2013-04-07 NOTE — Progress Notes (Signed)
A second unit of blood is transfusing without difficulty.  The patient's orthostatic vital signs were taken and are charted starting at 0508.  They are borderline positive.  An EKG was obtained and placed in the patient's chart.

## 2013-04-08 ENCOUNTER — Inpatient Hospital Stay (HOSPITAL_COMMUNITY): Payer: MEDICAID

## 2013-04-08 ENCOUNTER — Inpatient Hospital Stay (HOSPITAL_COMMUNITY): Payer: Self-pay

## 2013-04-08 LAB — TYPE AND SCREEN
Unit division: 0
Unit division: 0

## 2013-04-08 LAB — URINE CULTURE: Colony Count: >=100000

## 2013-04-08 LAB — BASIC METABOLIC PANEL
BUN: 8 mg/dL (ref 6–23)
Calcium: 8.5 mg/dL (ref 8.4–10.5)
Creatinine, Ser: 0.71 mg/dL (ref 0.50–1.10)
GFR calc Af Amer: >90 mL/min (ref >90)
GFR calc non Af Amer: >90 mL/min (ref >90)

## 2013-04-08 LAB — CBC
HCT: 28.7 % — ABNORMAL LOW (ref 36.0–46.0)
MCH: 22 pg — ABNORMAL LOW (ref 26.0–34.0)
MCHC: 31.7 g/dL (ref 30.0–36.0)
MCV: 69.3 fL — ABNORMAL LOW (ref 78.0–100.0)
RDW: 25.4 % — ABNORMAL HIGH (ref 11.5–15.5)

## 2013-04-08 MED ORDER — GADOBENATE DIMEGLUMINE 529 MG/ML IV SOLN
15.0000 mL | Freq: Once | INTRAVENOUS | Status: AC
  Administered 2013-04-08: 08:00:00 12 mL via INTRAVENOUS

## 2013-04-08 NOTE — Discharge Summary (Signed)
  Date: 04/08/2013  Patient name: Sherri Shaw  Medical record number: [id number]  Date of birth: Jan 28, 1981   This patient has been seen and the plan of care was discussed with the house staff. Please see their note for complete details. I concur with their findings and plan.  Jonah Blue, DO, FACP Faculty [facility] Internal Medicine Residency Program 04/08/2013, 3:53 PM

## 2013-04-08 NOTE — Progress Notes (Signed)
The patient did not have any complaints this morning and she did not have any acute changes overnight.  Her Hgb level after the two blood transfusions during the day was 9.9.

## 2013-04-08 NOTE — Discharge Summary (Signed)
Name: Sherri Shaw MRN: [id number] DOB: 07-11-1981 32 y.o. PCP: No Pcp Per Patient  Date of Admission: 04/06/2013  9:45 PM Date of Discharge: 04/08/2013 Attending Physician: Dr. Kem Kays  Discharge Diagnosis: Principal Problem:   Microcytic anemia Active Problems:   Uterine mass   Trichomonas   Bipolar disorder   Bilateral lower extremity edema  Discharge Medications:   Medication List         albuterol 108 (90 BASE) MCG/ACT inhaler  Commonly known as:  PROVENTIL HFA;VENTOLIN HFA  Inhale 2 puffs into the lungs every 6 (six) hours as needed for wheezing.     lurasidone 40 MG Tabs tablet  Commonly known as:  LATUDA  Take 40 mg by mouth daily with breakfast.        Disposition and follow-up:   [name] was discharged from [facility] in Good condition.  At the hospital follow up visit please address:  1.  Microcytic anemia- please reevaluate 2.  Uterine fibroids- please make sure patient followed up with GYN  2.  Labs / imaging needed at time of follow-up: CBC  3.  Pending labs/ test needing follow-up: none  Follow-up Appointments: Follow-up Information   Follow up with Sherri Rosenthal, MD On 04/10/2013. (9:45am)    Specialty:  Obstetrics and Gynecology   Contact information:   [address] [place] [address] [phone]       Discharge Instructions: Discharge Orders   Future Appointments Provider Department Dept Phone   04/10/2013 9:45 AM Sherri Rosenthal, MD [facility] [phone]   Future Orders Complete By Expires   Call MD for:  persistant nausea and vomiting  As directed    Call MD for:  severe uncontrolled pain  As directed    Diet - low sodium heart healthy  As directed    Increase activity slowly  As directed      Consultations:  none  Procedures Performed:   MRI pelvix w/ and w/o contrast  Massively enlarged uterus with numerous uterine fibroids. 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 Enlarged/prominent right gonadal  vein.  Suspected 5.2 cm right ovarian cyst. Left ovary is within normal  limits.  No suspicious pelvic lymphadenopathy. Small inguinal nodes are  within normal limits and likely reactive.   Ct Abdomen Pelvis Wo Contrast  04/07/2013   *RADIOLOGY REPORT*  Clinical Data: Leg swelling.  Abdominal distention.  CT ABDOMEN AND PELVIS WITHOUT CONTRAST  Technique:  Multidetector CT imaging of the abdomen and pelvis was performed following the standard protocol without intravenous contrast.  Comparison: Abdominal radiograph 04/06/2013  Findings: Lung bases:  The lung bases are clear.  Abdomen/pelvis:  The uterus is massively enlarged and contains innumerable soft tissue masses within it.  These masses most likely reflect multiple fibroids.  Three of the fibroids are at least partially calcified, the one in the right aspect of the uterus accounts for the calcification seen on today's abdominal radiograph.  The uterus measures approximately 28 cm in sagittal length, and extends approximately 7 cm superior to the patient's umbilicus.  Uterus measures up to 19 cm transverse diameter and up to 17 cm AP diameter, and nearly occupies the entire lower abdomen and upper pelvis.  The largest uterine mass is within the inferior uterus and measures up to 9.7 x 11 centimeters.  Fluid density oval mass wit in the right posterior aspect of the uterus likely reflects a degenerating/infarcting fibroid, which can  be a source of pain.  The enlarged uterus causes mass effect on the bowel loops, and likely causes mass effect upon the inferior vena cava and abdominal aorta.  It may be the cause of the patient's leg swelling. Due to the presence of mass in the absence of contrast, the abdominal aorta and vena cava not well visualized.  The liver, spleen, adrenal glands, pancreas, and kidneys show no abnormality on noncontrast imaging.  Urinary bladder  is unremarkable.  Inguinal lymph nodes are mildly prominent bilaterally, measuring up to 14 x 14 mm on the right and 16 x 15 mm on the left.  Evaluation for intra-abdominal lymphadenopathy is limited given the lack of contrast and the large uterine size.  No acute osseous abnormality.  IMPRESSION:  1.  Massively enlarged uterus (approximately 28 x 19 x 17 cm) contains multiple masses, likel fibroids.  Given the large size of uterus and large size of the masses, sarcomatous degeneration of fibroids cannot be excluded.  The uterus extends 7 cm cephalad to the umbilicus and causes mass effect on the abdominal bowel loops, likely on the aorta and inferior vena cava, and may be causing the patient's leg swelling.  Gynecology consultation is recommended. 2.  Prominent inguinal lymph nodes bilaterally.   Original Report Authenticated By: Sherri Shaw, M.D.   Ct Abdomen Pelvis Wo Contrast  04/06/2013   [name]     04/06/2013 11:40 PM Per Dr. Oleh Shaw, ER resident, this exam was ordered due to  the pt having some abdominal distention and feels the pt has a  palpable mass.  The dr states that this is a screening and that  doing the exam without IV and/or oral contrast will be  sufficient.  Dg Chest 2 View  04/06/2013   *RADIOLOGY REPORT*  Clinical Data: Leg swelling  CHEST - 2 VIEW  Comparison: None.  Findings: Heart size appears upper normal to borderline enlarged. There is mild central pulmonary vascular congestion.  The lungs are clear.  Negative for airspace disease, effusion, or pneumothorax. Trachea is midline.  Visualized bowel gas pattern is normal.  No acute osseous abnormality.  IMPRESSION: Borderline cardiomegaly and probable mild central pulmonary vascular congestion.   Original Report Authenticated By: Sherri Shaw, M.D.   Dg Abd 1 View  04/06/2013   *RADIOLOGY REPORT*  Clinical Data: Leg swelling  ABDOMEN - 1 VIEW  Comparison: Chest radiograph 04/06/2013  Findings: Relative paucity of bowel  gas in the central abdomen, with loss of the normal psoas margins.  Cannot exclude ascites or abdominal/pelvic mass as the cause of these findings.  Additionally, there is an oval-shaped peripherallycalcified structure in the right lower quadrant that measures 7 x 6.3 cm.  No evidence of bowel obstruction.  IMPRESSION: Peripherally calcified right lower quadrant mass of uncertain etiology.  Paucity of bowel loops in the central abdomen, for which ascites or soft tissue mass cannot be excluded.  Suggest further evaluation with CT of the abdomen pelvis with IV and oral contrast.   Original Report Authenticated By: Sherri Shaw, M.D.   Admission HPI:  Sherri Shaw is a 32 y.o. female with a history of asthma and bipolar d/o who comes to the ED with a CC of leg swelling. The interview was made challenging by the patients child like affect and lack of insight. The history was corroborated with her sister. The patient reports a couple day history of leg swelling, that was much worse today. The patient notcied this morning that she  could not get her shoes on. She has an associated symptom of 4 years of abdominal pain and progressive distension. She admits exertional dyspnea and generalized weakness. She admits to heavy menstrual flow 1 x per month for 7 days. She denies vaginal bleeding, hematachezia. She denies N/V, hematemesis, constipation, diarrhea, SOB, chest pain. She denies alcohol, drug use, tobacco use. She denies sexual activity.   She reports an extensive family history of stomach tumors, but does not know much about it.  Hospital Course by problem list:   # Microcytic Anemia  The patient denies any evidence of bleeding aside from monthly heavy periods. On admission her Hb was 4.3, but she was asymptomatic and hemodynamically stable. The anemia is likely chronic iron deficiency anemia due to chronic blood loss 2/2 her uterine masses, Fe <10, Ferritin is 1and retic index of 0.17. S/P 4U pRBCs since  admission. Hb 9.9 s/p 4 units and remained stable at 9.1 morning of discharge. We also gave her one dose of Ferehem IV.   # LE swelling  Patient presented with new onset BLE edema for a few days. We performed an MRI pelvis (was supposed to be abdomen and pelvis, but the abdominal MRI was not performed), which showed large mass effect on IVC, which is likely the cause of her BLE edema. This will most likely resolve after intervention by GYN. The MRI did not show evidence of IVC thrombosis- though d dimer was elevated- this is nonspecific however. Patient did have borderline cardiomegaly with mild pulmonary vascular congestion on CXR, so CHF was considered, but Echo 9/2 showed was normal.  #Uterine Mass c/b bil LE swelling  CT showed massively enlarged uterus w/ multiple masses likely representing fibroids, though sarcomatous degeneration was not ruled out. The mass effect of her uterus is causing IVC compression leading to LE swelling. I spoke with GYN and they recommend obtaining MRI abd/pelvis to eval for sarcomatous degeneration of patient's fibroids. MRI abd/pelvis with and without contrast was ordered, but only MRI pelvis was performed. GYN agrees to follow up with patient later this week for outpatient work up.   # Trichomonas 2 g Flagyl once   # Bipolar Continued home Latuda   # Asthma Continued home albuterol  Discharge Vitals:   BP 105/61  Pulse 80  Temp(Src) 98.3 F (36.8 C) (Oral)  Resp 17  Ht 5\' 4"  (1.626 m)  Wt 61.598 kg (135 lb 12.8 oz)  BMI 23.3 kg/m2  SpO2 99%  LMP 03/23/2013  Discharge Labs:  Results for orders placed during the hospital encounter of 04/06/13 (from the past 24 hour(s))  CBC     Status: Abnormal   Collection Time    04/07/13 10:30 PM      Result Value Range   WBC 8.1  4.0 - 10.5 K/uL   RBC 4.50  3.87 - 5.11 MIL/uL   Hemoglobin 9.9 (*) 12.0 - 15.0 g/dL   HCT 40.9 (*) 81.1 - 91.4 %   MCV 69.6 (*) 78.0 - 100.0 fL   MCH 22.0 (*) 26.0 - 34.0 pg   MCHC  31.6  30.0 - 36.0 g/dL   RDW 78.2 (*) 95.6 - 21.3 %   Platelets 720 (*) 150 - 400 K/uL  CBC     Status: Abnormal   Collection Time    04/08/13  5:12 AM      Result Value Range   WBC 6.5  4.0 - 10.5 K/uL   RBC 4.14  3.87 - 5.11 MIL/uL  Hemoglobin 9.1 (*) 12.0 - 15.0 g/dL   HCT 16.1 (*) 09.6 - 04.5 %   MCV 69.3 (*) 78.0 - 100.0 fL   MCH 22.0 (*) 26.0 - 34.0 pg   MCHC 31.7  30.0 - 36.0 g/dL   RDW 40.9 (*) 81.1 - 91.4 %   Platelets 619 (*) 150 - 400 K/uL  BASIC METABOLIC PANEL     Status: None   Collection Time    04/08/13  5:12 AM      Result Value Range   Sodium 143  135 - 145 mEq/L   Potassium 4.1  3.5 - 5.1 mEq/L   Chloride 111  96 - 112 mEq/L   CO2 22  19 - 32 mEq/L   Glucose, Bld 94  70 - 99 mg/dL   BUN 8  6 - 23 mg/dL   Creatinine, Ser 7.82  0.50 - 1.10 mg/dL   Calcium 8.5  8.4 - 95.6 mg/dL   GFR calc non Af Amer >90  >90 mL/min   GFR calc Af Amer >90  >90 mL/min    Signed: Windell Hummingbird, MD 04/08/2013, 1:49 PM   Time Spent on Discharge: 35 minutes Services Ordered on Discharge: none Equipment Ordered on Discharge: none

## 2013-04-08 NOTE — Progress Notes (Signed)
  Date: 04/08/2013  Patient name: Sherri Shaw  Medical record number: [id number]  Date of birth: 05-Jun-1981   This patient has been seen and the plan of care was discussed with the house staff. Please see their note for complete details. I concur with their findings with the following additions/corrections: Discussed MRI with Dr. Rito Ehrlich of radiology. Multiple fibroids in uterus w/ mass effect on IVC. No DVT was seen on MRI.  LE edema is likely a result of above IVC compression. She is at high risk for DVT. At this time, given that she has no SOB, CP, N/V or abdominal pain, consult with Gyn for planned TAH as outpatient. No evidence of clinical bleeding. S/P PRBC transfusion and IV iron infusion. She needs to continue FeSO4 325 mg PO tid as outpatient. From a medical standpoint, she can be D/C with Gyn follow up unless they plan TAH as inpatient.  Jonah Blue, DO, FACP Faculty [facility] Internal Medicine Residency Program 04/08/2013, 12:03 PM

## 2013-04-08 NOTE — Progress Notes (Signed)
Subjective: Patient feels well this morning. She has no SOB, CP, abd pain, N/V, lightheadedness. She reports no worsening of her BLE swelling. MRI was not completed yesterday. Transport came to pick up patient for MRI during my interview. Hb has improved after receiving 4U pRBCs yesterday.  Objective: Vital signs in last 24 hours: Filed Vitals:   04/07/13 1830 04/07/13 1931 04/07/13 2159 04/08/13 0533  BP: 98/62 101/62 100/58 105/61  Pulse: 76 81 81 80  Temp: 98.7 F (37.1 C) 98.8 F (37.1 C) 98.3 F (36.8 C) 98.3 F (36.8 C)  TempSrc: Oral Oral Oral Oral  Resp: 16 16 16 17   Height:      Weight:    61.598 kg (135 lb 12.8 oz)  SpO2:  98% 97% 99%   Weight change: -0.635 kg (-1 lb 6.4 oz)  Intake/Output Summary (Last 24 hours) at 04/08/13 1610 Last data filed at 04/08/13 0612  Gross per 24 hour  Intake    508 ml  Output    900 ml  Net   -392 ml   Physical Exam General: alert, cooperative, and in no apparent distress; answers questions with brief responses, but appropriately HEENT: pupils equal round and reactive to light, vision grossly intact, oropharynx clear and non-erythematous  Neck: supple Lungs: clear to ascultation bilaterally, normal work of respiration, no wheezes, rales, ronchi Heart: regular rate and rhythm, no murmurs, gallops, or rubs Abdomen: firm, distended, non-tender, with palpable masses to RUQ and RLQ, normal bowel sounds Extremities: warm extremities, 2+ pitting edema to BLE, no skin changes to BLE Neurologic: alert & oriented X3, strength and sensation grossly intact; affect is blunted  Lab Results: Basic Metabolic Panel:  Recent Labs Lab 04/06/13 2322 04/08/13 0512  NA 137 143  K 3.4* 4.1  CL 104 111  CO2 25 22  GLUCOSE 94 94  BUN 8 8  CREATININE 0.69 0.71  CALCIUM 8.4 8.5   Liver Function Tests:  Recent Labs Lab 04/06/13 2322  AST 14  ALT 7  ALKPHOS 32*  BILITOT 0.1*  PROT 7.1  ALBUMIN 3.1*   CBC:  Recent Labs Lab  04/06/13 2322  04/07/13 2230 04/08/13 0512  WBC 5.1  < > 8.1 6.5  NEUTROABS 2.5  --   --   --   HGB 4.6*  < > 9.9* 9.1*  HCT 17.3*  < > 31.3* 28.7*  MCV 60.9*  < > 69.6* 69.3*  PLT 769*  < > 720* 619*  < > = values in this interval not displayed. BNP:  Recent Labs Lab 04/06/13 2322  PROBNP 131.1*   D-Dimer:  Recent Labs Lab 04/07/13 1145  DDIMER 1.10*   Coagulation:  Recent Labs Lab 04/07/13 0005  LABPROT 14.1  INR 1.11   Anemia Panel:  Recent Labs Lab 04/07/13 0005  VITAMINB12 537  FOLATE >20.0  FERRITIN 1*  TIBC Not calculated due to Iron <10.  IRON <10*  RETICCTPCT 1.2   Urinalysis:  Recent Labs Lab 04/06/13 2155  COLORURINE YELLOW  LABSPEC 1.020  PHURINE 7.5  GLUCOSEU NEGATIVE  HGBUR NEGATIVE  BILIRUBINUR NEGATIVE  KETONESUR NEGATIVE  PROTEINUR NEGATIVE  UROBILINOGEN 1.0  NITRITE NEGATIVE  LEUKOCYTESUR MODERATE*   Studies/Results: Ct Abdomen Pelvis Wo Contrast  04/07/2013   *RADIOLOGY REPORT*  Clinical Data: Leg swelling.  Abdominal distention.  CT ABDOMEN AND PELVIS WITHOUT CONTRAST  Technique:  Multidetector CT imaging of the abdomen and pelvis was performed following the standard protocol without intravenous contrast.  Comparison: Abdominal radiograph  04/06/2013  Findings: Lung bases:  The lung bases are clear.  Abdomen/pelvis:  The uterus is massively enlarged and contains innumerable soft tissue masses within it.  These masses most likely reflect multiple fibroids.  Three of the fibroids are at least partially calcified, the one in the right aspect of the uterus accounts for the calcification seen on today's abdominal radiograph.  The uterus measures approximately 28 cm in sagittal length, and extends approximately 7 cm superior to the patient's umbilicus.  Uterus measures up to 19 cm transverse diameter and up to 17 cm AP diameter, and nearly occupies the entire lower abdomen and upper pelvis.  The largest uterine mass is within the inferior  uterus and measures up to 9.7 x 11 centimeters.  Fluid density oval mass wit in the right posterior aspect of the uterus likely reflects a degenerating/infarcting fibroid, which can be a source of pain.  The enlarged uterus causes mass effect on the bowel loops, and likely causes mass effect upon the inferior vena cava and abdominal aorta.  It may be the cause of the patient's leg swelling. Due to the presence of mass in the absence of contrast, the abdominal aorta and vena cava not well visualized.  The liver, spleen, adrenal glands, pancreas, and kidneys show no abnormality on noncontrast imaging.  Urinary bladder is unremarkable.  Inguinal lymph nodes are mildly prominent bilaterally, measuring up to 14 x 14 mm on the right and 16 x 15 mm on the left.  Evaluation for intra-abdominal lymphadenopathy is limited given the lack of contrast and the large uterine size.  No acute osseous abnormality.  IMPRESSION:  1.  Massively enlarged uterus (approximately 28 x 19 x 17 cm) contains multiple masses, likel fibroids.  Given the large size of uterus and large size of the masses, sarcomatous degeneration of fibroids cannot be excluded.  The uterus extends 7 cm cephalad to the umbilicus and causes mass effect on the abdominal bowel loops, likely on the aorta and inferior vena cava, and may be causing the patient's leg swelling.  Gynecology consultation is recommended. 2.  Prominent inguinal lymph nodes bilaterally.   Original Report Authenticated By: Britta Mccreedy, M.D.   Ct Abdomen Pelvis Wo Contrast  04/06/2013   [name]     04/06/2013 11:40 PM Per Dr. Oleh Genin, ER resident, this exam was ordered due to  the pt having some abdominal distention and feels the pt has a  palpable mass.  The dr states that this is a screening and that  doing the exam without IV and/or oral contrast will be  sufficient.  Dg Chest 2 View  04/06/2013   *RADIOLOGY REPORT*  Clinical Data: Leg swelling  CHEST - 2 VIEW  Comparison:  None.  Findings: Heart size appears upper normal to borderline enlarged. There is mild central pulmonary vascular congestion.  The lungs are clear.  Negative for airspace disease, effusion, or pneumothorax. Trachea is midline.  Visualized bowel gas pattern is normal.  No acute osseous abnormality.  IMPRESSION: Borderline cardiomegaly and probable mild central pulmonary vascular congestion.   Original Report Authenticated By: Britta Mccreedy, M.D.   Dg Abd 1 View  04/06/2013   *RADIOLOGY REPORT*  Clinical Data: Leg swelling  ABDOMEN - 1 VIEW  Comparison: Chest radiograph 04/06/2013  Findings: Relative paucity of bowel gas in the central abdomen, with loss of the normal psoas margins.  Cannot exclude ascites or abdominal/pelvic mass as the cause of these findings.  Additionally, there is an oval-shaped  peripherallycalcified structure in the right lower quadrant that measures 7 x 6.3 cm.  No evidence of bowel obstruction.  IMPRESSION: Peripherally calcified right lower quadrant mass of uncertain etiology.  Paucity of bowel loops in the central abdomen, for which ascites or soft tissue mass cannot be excluded.  Suggest further evaluation with CT of the abdomen pelvis with IV and oral contrast.   Original Report Authenticated By: Britta Mccreedy, M.D.   Medications: I have reviewed the patient's current medications. Scheduled Meds: . lurasidone  40 mg Oral Q breakfast  . pneumococcal 23 valent vaccine  0.5 mL Intramuscular Tomorrow-1000  . sodium chloride  3 mL Intravenous Q12H  . sodium chloride  3 mL Intravenous Q12H   Continuous Infusions:  PRN Meds:.sodium chloride, albuterol, sodium chloride  Assessment/Plan:  # Microcytic Anemia  The patient denies any evidence of bleeding aside from monthly heavy periods. On admission her Hb was 4.3, but she was asymptomatic and hemodynamically stable. The anemia is likely chronic iron deficiency anemia due to chronic blood loss 2/2 her uterine masses, Fe <10, Ferritin  is 1and retic index of 0.17. S/P 4U pRBCs since admission- Hb 9.9 and 9.1 this morning.  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 However, there is borderline cardiomegaly with mild pulmonary vascular congestion, so CHF cannot be ruled out. Not thought to be 2/2 DVT since [place] Risk of DVT = 1, however D dimer is positive. Additionally, we are unable to definitively rule out IVC thrombus at this time, which may also cause BLE edema.  - Echo 9/2- normal systolic function, essentially normal Echo - MRI pelvis and abd w/ and w/o contrast to eval for IVC thrombus- if normal, patient can be discharged with close follow up with GYN  #Uterine Mass c/b bil LE swelling  CT showed massively enlarged uterus w/ multiple masses likely representing fibroids, though sarcomatous degeneration was not ruled out. The mass effect of her uterus may be causing IVC compression leading to LE swelling.  - GYN consulted [phone])- I spoke with GYN and they recommend obtaining MRI abd/pelvis to eval for sarcomatous degeneration of patient's fibroids; additionally, they usually eval this type of uterine pathology in the outpatient setting as long as patient is medically stable; I also talked to radiology who stated that this MRI study would be helpful for evaluating for IVC thrombus as well  # Trichomonas  - 2 g Flagyl once   # Bipolar  - Continue home Latuda   # Asthma  - Continue home albuterol  Dispo: Disposition is deferred at this time, awaiting improvement of current medical problems.  Anticipated discharge in approximately 1 day(s).   The patient does not have a current PCP (No Pcp Per Patient) and does need an [facility] hospital follow-up appointment after discharge.  The patient does not have transportation limitations that hinder transportation to clinic appointments.  .Services Needed at  time of discharge: Y = Yes, Blank = No PT:   OT:   RN:   Equipment:   Other:     LOS: 2 days   Windell Hummingbird, MD 04/08/2013, 8:22 AM

## 2013-04-09 MED ORDER — GADOBENATE DIMEGLUMINE 529 MG/ML IV SOLN
12.0000 mL | Freq: Once | INTRAVENOUS | Status: AC | PRN
  Administered 2013-04-08: 08:00:00 12 mL via INTRAVENOUS

## 2013-04-10 ENCOUNTER — Encounter: Payer: Self-pay | Admitting: Obstetrics & Gynecology

## 2013-04-10 ENCOUNTER — Ambulatory Visit (INDEPENDENT_AMBULATORY_CARE_PROVIDER_SITE_OTHER): Payer: Self-pay | Admitting: Obstetrics & Gynecology

## 2013-04-10 VITALS — BP 103/68 | HR 66 | Temp 97.1°F | Ht 62.0 in | Wt 138.3 lb

## 2013-04-10 DIAGNOSIS — Z01419 Encounter for gynecological examination (general) (routine) without abnormal findings: Secondary | ICD-10-CM

## 2013-04-10 DIAGNOSIS — F259 Schizoaffective disorder, unspecified: Secondary | ICD-10-CM | POA: Insufficient documentation

## 2013-04-10 DIAGNOSIS — D259 Leiomyoma of uterus, unspecified: Secondary | ICD-10-CM

## 2013-04-10 DIAGNOSIS — N920 Excessive and frequent menstruation with regular cycle: Secondary | ICD-10-CM

## 2013-04-10 DIAGNOSIS — D500 Iron deficiency anemia secondary to blood loss (chronic): Secondary | ICD-10-CM

## 2013-04-10 MED ORDER — MEGESTROL ACETATE 40 MG PO TABS: 40.0000 mg | ORAL_TABLET | Freq: Two times a day (BID) | ORAL | Status: DC

## 2013-04-10 MED ORDER — FERROUS SULFATE 325 (65 FE) MG PO TABS: 325.0000 mg | ORAL_TABLET | Freq: Two times a day (BID) | ORAL | Status: DC

## 2013-04-10 NOTE — Progress Notes (Signed)
Subjective:     Patient ID: Sherri Shaw, female   DOB: Apr 26, 1981, 32 y.o.   MRN: [id number]  HPI Pt presents for hospital f/u.  She was recently discharged from [facility] hospital.  She was admitted for bilateral LE edema and anemia.  Was discovered to have a large pelvic mass.  Pt reports that it has been there for 'some years'.  Her sister confirms that her abd has been enlarged since she moved here from [place] in 2010.  Pt reports heavy bleeding but, does not know if she has clots.  It is difficult to ascertain how heavy the bleeding is but, it appears to last 7 to 10 days.    Pt is her own legal guardian but, her sister is trying to obtain guardianship.  She lives with her sister. The history was obtained mostly by patient with her sister providing details when available. The sister is not aware of exactly how long of often the pt is bleeding.    Past Medical History  Diagnosis Date  . Asthma   . Chronic bronchitis     "I get it q yr" (04/07/2013)  . Exertional shortness of breath   . Anemia   . History of blood transfusion 04/07/2013    "got my first transfusion today; got 4 units" (04/07/2013)  . Bipolar disorder     sister denies this hx on 04/07/2013  . Schizophrenia     "don't really know the type"/sister (04/07/2013)  . Microcytic anemia     Hattie Perch 04/07/2013       Past Surgical History  Procedure Laterality Date  . No past surgeries     Current Outpatient Prescriptions on File Prior to Visit  Medication Sig Dispense Refill  . albuterol (PROVENTIL HFA;VENTOLIN HFA) 108 (90 BASE) MCG/ACT inhaler Inhale 2 puffs into the lungs every 6 (six) hours as needed for wheezing.      Marland Kitchen lurasidone (LATUDA) 40 MG TABS tablet Take 40 mg by mouth daily with breakfast.       No current facility-administered medications on file prior to visit.   History   Social History  . Marital Status: Single    Spouse Name: N/A    Number of Children: N/A  . Years of Education: Coll   Occupational History  . Not  on file.   Social History Main Topics  . Smoking status: Never Smoker   . Smokeless tobacco: Never Used  . Alcohol Use: No  . Drug Use: No  . Sexual Activity: Not Currently   Other Topics Concern  . 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 Hypertension Mother   . Diabetes Mother   . Asthma Mother   . Asthma Brother    Current Outpatient Prescriptions on File Prior to Visit  Medication Sig Dispense Refill  . albuterol (PROVENTIL HFA;VENTOLIN HFA) 108 (90 BASE) MCG/ACT inhaler Inhale 2 puffs into the lungs every 6 (six) hours as needed for wheezing.      Marland Kitchen lurasidone (LATUDA) 40 MG TABS tablet Take 40 mg by mouth daily with breakfast.       No current facility-administered medications on file prior to visit.     Review of Systems     Objective:   Physical Exam BP 103/68  Pulse 66  Temp(Src) 97.1 F (36.2 C) (Oral)  Ht 5'  2" (1.575 m)  Wt 138 lb 4.8 oz (62.732 kg)  BMI 25.29 kg/m2  LMP 04/09/2013 Lungs: CTA CV: RRR Abd: pt looks like she has a term pregnancy FH 6-8cm above umbilicus GU: EGBUS: no lesions Vagina: large amount of blood in vault Cervix: no lesion; no mucopurulent d/c Uterus: enlarged and lobular Adnexa: unable to fully assess due to mass effect Ext: bilateral lower ext edema- 2+; symetric  CBC    Component Value Date/Time   WBC 6.5 04/08/2013 0512   RBC 4.14 04/08/2013 0512   RBC 2.86* 04/07/2013 0005   HGB 9.1* 04/08/2013 0512   HCT 28.7* 04/08/2013 0512   PLT 619* 04/08/2013 0512   MCV 69.3* 04/08/2013 0512   MCH 22.0* 04/08/2013 0512   MCHC 31.7 04/08/2013 0512   RDW 25.4* 04/08/2013 0512   LYMPHSABS 1.6 04/06/2013 2322   MONOABS 0.5 04/06/2013 2322   EOSABS 0.4 04/06/2013 2322   BASOSABS 0.1 04/06/2013 2322          04/07/2013  Clinical Data: Leg swelling. Abdominal distention.  CT ABDOMEN AND PELVIS WITHOUT CONTRAST   Technique: Multidetector CT imaging of the abdomen and pelvis was  performed following the standard protocol without intravenous  contrast.  Comparison: Abdominal radiograph 04/06/2013  Findings: Lung bases: The lung bases are clear.  Abdomen/pelvis: The uterus is massively enlarged and contains  innumerable soft tissue masses within it. These masses most likely  reflect multiple fibroids. Three of the fibroids are at least  partially calcified, the one in the right aspect of the uterus  accounts for the calcification seen on today's abdominal  radiograph. The uterus measures approximately 28 cm in sagittal  length, and extends approximately 7 cm superior to the patient's  umbilicus. Uterus measures up to 19 cm transverse diameter and up  to 17 cm AP diameter, and nearly occupies the entire lower abdomen  and upper pelvis. The largest uterine mass is within the inferior  uterus and measures up to 9.7 x 11 centimeters. Fluid density oval  mass wit in the right posterior aspect of the uterus likely  reflects a degenerating/infarcting fibroid, which can be a source  of pain.  The enlarged uterus causes mass effect on the bowel loops, and  likely causes mass effect upon the inferior vena cava and abdominal  aorta. It may be the cause of the patient's leg swelling. Due to  the presence of mass in the absence of contrast, the abdominal  aorta and vena cava not well visualized.  The liver, spleen, adrenal glands, pancreas, and kidneys show no  abnormality on noncontrast imaging. Urinary bladder is  unremarkable.  Inguinal lymph nodes are mildly prominent bilaterally, measuring up  to 14 x 14 mm on the right and 16 x 15 mm on the left.  Evaluation for intra-abdominal lymphadenopathy is limited given the  lack of contrast and the large uterine size.  No acute osseous abnormality.  IMPRESSION:  1. Massively enlarged uterus (approximately 28 x 19 x 17 cm)  contains multiple masses, likel  fibroids. Given the large size of  uterus and large size of the masses, sarcomatous degeneration of  fibroids cannot be excluded. The uterus extends 7 cm cephalad to  the umbilicus and causes mass effect on the abdominal bowel loops,  likely on the aorta and inferior vena cava, and may be causing the  patient's leg swelling. Gynecology consultation is recommended.  2. Prominent inguinal lymph nodes bilaterally.   04/08/2013 MRI PELVIS WITHOUT AND WITH CONTRAST  Technique: Multiplanar multisequence MR imaging of the pelvis was  performed both before and after the administration of intravenous  contrast.  Contrast: 12 ml Multihance IV  Comparison: Unenhanced CT abdomen pelvis dated 04/06/2013.  Findings: Massively enlarged uterus with numerous fibroids,  measuring approximately 13.8 x 20.2 x 20.5 cm.  Representative fibroids include:  --5.4 x 7.4 cm pedunculated fibroid along the right uterine fundus  (series 3/image 2)  --7.6 x 8.4 cm submucosal fibroid in the right posterior uterine  body (series 3/image 15)  --10.5 x 13.1 cm intramural fibroid in the left lower uterine body  (series 3/image 25)  Majority of the fibroids enhance heterogeneously following contrast  administration, with the exception of four small fibroids measuring  up to 5.1 cm (series 2002/image 62).  Endometrial complex is effaced/distorted.  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 The right gonadal vein is  enlarged/prominent (series 2002/image 71) but patent.  5.2 x 4.2 cm cystic lesion in the right mid abdomen is favored to  reflect an ovarian cyst (series 3/image 15).  Suspected left ovary measures 3.2 x 3.3 cm and is unremarkable  (series 2002/image 50).  Bladder is within normal limits.  No pelvic ascites.  No suspicious pelvic lymphadenopathy. Small bilateral inguinal  lymph nodes measuring up to 11-12 mm short-axis are within normal  limits and  likely reactive (series 17/image 25).  On coronal imaging, the visualized kidneys are unremarkable,  without hydronephrosis.  No focal osseous lesions.  IMPRESSION:  Massively enlarged uterus with numerous uterine fibroids, as  described above.  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 Enlarged/prominent right gonadal  vein.  Suspected 5.2 cm right ovarian cyst. Left ovary is within normal  limits.  No suspicious pelvic lymphadenopathy. Small inguinal nodes are  within normal limits and likely reactive.     Assessment:     Symptomatic uterine fibroids- d/w pt and her sister treatment options.  Pt does not have any plans for child bearing in the future so a myomectomy is not considered an option.  They want to proceed with definitve treatement with hyst.        Plan:     Megace 40mg  bid FeSO4 Patient desires surgical management with hysterectomy with bilateral salpingectomy after placement of ureteral stents preop.  The risks of surgery were discussed in detail with the patient (who is her own legal guardian and her sister who she lives with) including but not limited to: bleeding which may require transfusion or reoperation; infection which may require prolonged hospitalization or re-hospitalization and antibiotic therapy; injury to bowel, bladder, ureters and major vessels or other surrounding organs; need for additional procedures including laparotomy; thromboembolic phenomenon, incisional problems and other postoperative or anesthesia complications.  Patient was told that the likelihood that her condition and symptoms will be treated effectively with this surgical management was very high; the postoperative expectations were also discussed in detail. The patient also understands the alternative treatment options which were discussed in full. All questions were answered.  She was told that she will be contacted by our surgical scheduler  regarding the time and date of her surgery; routine preoperative instructions of having nothing to eat or drink after midnight on the day prior to surgery and also coming to the hospital 1 1/2 hours prior to her time of surgery were also emphasized.  She was told she may be called for a  preoperative appointment about a week prior to surgery and will be given further preoperative instructions at that visit. Printed patient education handouts about the procedure were given to the patient to review at home.

## 2013-04-10 NOTE — Patient Instructions (Addendum)
Uterine Fibroid A uterine fibroid is a growth (tumor) that occurs in a woman's uterus. This type of tumor is not cancerous and does not spread out of the uterus. A woman can have one or many fibroids, and the fiboid(s) can become quite large. A fibroid can vary in size, weight, and where it grows in the uterus. Most fibroids do not require medical treatment, but some can cause pain or heavy bleeding during and between periods. CAUSES  A fibroid is the result of a single uterine cell that keeps growing (unregulated), which is different than most cells in the human body. Most cells have a control mechanism that keeps them from reproducing without control.  SYMPTOMS   Bleeding.  Pelvic pain and pressure.  Bladder problems due to the size of the fibroid.  Infertility and miscarriages depending on the size and location of the fibroid. DIAGNOSIS  A diagnosis is made by physical exam. Your caregiver may feel the lumpy tumors during a pelvic exam. Important information regarding size, location, and number of tumors can be gained by having an ultrasound. It is rare that other tests, such as a CT scan or MRI, are needed. TREATMENT   Your caregiver may recommend watchful waiting. This involves getting the fibroid checked by your caregiver to see if the fibroids grow or shrink.   Hormonal treatment or an intrauterine device (IUD) may be prescribed.   Surgery may be needed to remove the fibroids (myomectomy) or the uterus (hysterectomy). This depends on your situation. When fibroids interfere with fertility and a woman wants to become pregnant, a caregiver may recommend having the fibroids removed.  HOME CARE INSTRUCTIONS  Home care depends on how you were treated. In general:   Keep all follow-up appointments with your caregiver.   Only take medicine as told by your caregiver. Do not take aspirin. It can cause bleeding.   If you have excessive periods and soak tampons or pads in a half hour or  less, contact your caregiver immediately. If your periods are troublesome but not so heavy, lie down with your feet raised slightly above your heart. Place cold packs on your lower abdomen.   If your periods are heavy, write down the number of pads or tampons you use per month. Bring this information to your caregiver.   Talk to your caregiver about taking iron pills.   Include green vegetables in your diet.   If you were prescribed a hormonal treatment, take the hormonal medicines as directed.   If you need surgery, ask your caregiver for information on your specific surgery.  SEEK IMMEDIATE MEDICAL CARE IF:  You have pelvic pain or cramps not controlled with medicines.   You have a sudden increase in pelvic pain.   You have an increase of bleeding between and during periods.   You feel lightheaded or have fainting episodes.  MAKE SURE YOU:  Understand these instructions.  Will watch your condition.  Will get help right away if you are not doing well or get worse. Document Released: 07/20/2000 Document Revised: 10/15/2011 Document Reviewed: 08/13/2011 [employer] Patient Information 2014 [employer].  

## 2013-05-14 ENCOUNTER — Encounter (HOSPITAL_COMMUNITY): Payer: Self-pay | Admitting: Pharmacist

## 2013-05-18 ENCOUNTER — Ambulatory Visit (INDEPENDENT_AMBULATORY_CARE_PROVIDER_SITE_OTHER): Payer: Self-pay | Admitting: Obstetrics & Gynecology

## 2013-05-18 ENCOUNTER — Encounter: Payer: Self-pay | Admitting: Obstetrics & Gynecology

## 2013-05-18 ENCOUNTER — Encounter (HOSPITAL_COMMUNITY)
Admission: RE | Admit: 2013-05-18 | Discharge: 2013-05-18 | Disposition: A | Discharge status: Home / Self Care | Payer: Self-pay | Source: Ambulatory Visit | Attending: Obstetrics & Gynecology | Admitting: Obstetrics & Gynecology

## 2013-05-18 ENCOUNTER — Encounter (HOSPITAL_COMMUNITY): Payer: Self-pay

## 2013-05-18 VITALS — BP 118/75 | HR 63 | Ht 62.0 in | Wt 136.2 lb

## 2013-05-18 DIAGNOSIS — D219 Benign neoplasm of connective and other soft tissue, unspecified: Secondary | ICD-10-CM

## 2013-05-18 DIAGNOSIS — D259 Leiomyoma of uterus, unspecified: Secondary | ICD-10-CM

## 2013-05-18 DIAGNOSIS — Z01812 Encounter for preprocedural laboratory examination: Secondary | ICD-10-CM | POA: Insufficient documentation

## 2013-05-18 LAB — CBC
MCH: 24.5 pg — ABNORMAL LOW (ref 26.0–34.0)
Platelets: 290 10*3/uL (ref 150–400)
RBC: 4.16 MIL/uL (ref 3.87–5.11)

## 2013-05-18 LAB — ABO/RH: ABO/RH(D): A POS

## 2013-05-18 LAB — TYPE AND SCREEN: ABO/RH(D): A POS

## 2013-05-18 NOTE — Patient Instructions (Signed)
Your procedure is scheduled on:06/01/13  Enter through the Main Entrance at :6am Pick up desk phone and dial [phone] and inform us of your arrival.  Please call [phone] if you have any problems the morning of surgery.  Remember: Do not eat food or drink liquids, including water, after midnight:SUNDAY 05/31/13   You may brush your teeth the morning of surgery.  Bring inhaler to hospital  DO NOT wear jewelry, eye make-up, lipstick,body lotion, or dark fingernail polish.  (Polished toes are ok) You may wear deodorant.  If you are to be admitted after surgery, leave suitcase in car until your room has been assigned. Patients discharged on the day of surgery will not be allowed to drive home. Wear loose fitting, comfortable clothes for your ride home.

## 2013-05-18 NOTE — Progress Notes (Signed)
Patient ID: Sherri Shaw, female   DOB: April 10, 1981, 32 y.o.   MRN: [id number] The patient presents with her sister to review surgery.  Patient desires surgical management with total abdominal hysterectomy with bilateral salpingectomy for large uterine fibroids.  The risks of surgery were discussed in detail with the patient including but not limited to: bleeding which may require transfusion or reoperation; infection which may require prolonged hospitalization or re-hospitalization and antibiotic therapy; injury to bowel, bladder, ureters and major vessels or other surrounding organs; need for additional procedures including laparotomy; thromboembolic phenomenon, incisional problems and other postoperative or anesthesia complications.  Patient was told that the likelihood that her condition and symptoms will be treated effectively with this surgical management was very high; the postoperative expectations were also discussed in detail. The patient also understands the alternative treatment options which were discussed in full. All questions were answered.  She was told that she will be contacted by our surgical scheduler regarding the time and date of her surgery; routine preoperative instructions of having nothing to eat or drink after midnight on the day prior to surgery and also coming to the hospital 1 1/2 hours prior to her time of surgery were also emphasized.    I have placed a call to [facility] to discuss the placement of ureteral stents prior to the case.  Awaiting a call back.  Message forwarded to [facility] office manager to arrange.

## 2013-05-18 NOTE — Patient Instructions (Signed)
Hysterectomy °Care After °Refer to this sheet in the next few weeks. These instructions provide you with information on caring for yourself after your procedure. Your caregiver may also give you more specific instructions. Your treatment has been planned according to current medical practices, but problems sometimes occur. Call your caregiver if you have any problems or questions after your procedure. °HOME CARE INSTRUCTIONS  °Healing will take time. You may have discomfort, tenderness, swelling, and bruising at the surgical site for about 2 weeks. This is normal and will get better as time goes on. °· Only take over-the-counter or prescription medicines for pain, discomfort, or fever as directed by your caregiver. °· Do not take aspirin. It can cause bleeding. °· Do not drive when taking pain medicine. °· Follow your caregiver's advice regarding exercise, lifting, driving, and general activities. °· Resume your usual diet as directed and allowed. °· Get plenty of rest and sleep. °· Do not douche, use tampons, or have sexual intercourse for at least 6 weeks or until your caregiver gives you permission. °· Change your bandages (dressings) as directed by your caregiver. °· Monitor your temperature. °· Take showers instead of baths for 2 to 3 weeks. °· Do not drink alcohol until your caregiver gives you permission. °· If you are constipated, you may take a mild laxative with your caregiver's permission. Bran foods may help with constipation problems. Drinking enough fluids to keep your urine clear or pale yellow may help as well. °· Try to have someone home with you for 1 or 2 weeks to help around the house. °· Keep all of your follow-up appointments as directed by your caregiver. °SEEK MEDICAL CARE IF:  °· You have swelling, redness, or increasing pain in the surgical cut (incision) area. °· You have pus coming from the incision. °· You notice a bad smell coming from the incision or dressing. °· You have swelling,  redness, or pain around the intravenous (IV) site. °· Your incision breaks open. °· You feel dizzy or lightheaded. °· You have pain or bleeding when you urinate. °· You have persistent diarrhea. °· You have persistent nausea and vomiting. °· You have abnormal vaginal discharge. °· You have a rash. °· You have any type of abnormal reaction or develop an allergy to your medicine. °· Your pain is not controlled with your prescribed medicine. °SEEK IMMEDIATE MEDICAL CARE IF:  °· You have a fever. °· You have severe abdominal pain. °· You have chest pain. °· You have shortness of breath. °· You faint. °· You have pain, swelling, or redness of your leg. °· You have heavy vaginal bleeding with blood clots. °MAKE SURE YOU: °· Understand these instructions. °· Will watch your condition. °· Will get help right away if you are not doing well or get worse. °Document Released: 02/09/2005 Document Revised: 10/15/2011 Document Reviewed: 03/09/2011 °ExitCare® Patient Information ©2014 [employer]. ° °

## 2013-05-20 ENCOUNTER — Telehealth: Payer: Self-pay | Admitting: *Deleted

## 2013-05-20 NOTE — Telephone Encounter (Signed)
Received call from [facility] with [facility].  States she got a message from our office about a patient.  I explained Dr. Erin Fulling had phoned to set up a surgeon to place ureteral stents on 06/01/13 at 7:15 am before total hysterectomy with bilateral salpingectomy.  Rodman Pickle states Dr. Annabell Howells is on call on 06/01/13.  States she will send him a note to see if he will just come over and insert the stents that day.  States she will call me back by the end of the week to let me know.

## 2013-05-27 ENCOUNTER — Other Ambulatory Visit: Payer: Self-pay | Admitting: Urology

## 2013-06-01 ENCOUNTER — Inpatient Hospital Stay (HOSPITAL_COMMUNITY)
Admission: RE | Admit: 2013-06-01 | Discharge: 2013-06-03 | DRG: 742 | Disposition: A | Discharge status: Home / Self Care | Payer: Self-pay | Source: Ambulatory Visit | Attending: Obstetrics & Gynecology | Admitting: Obstetrics & Gynecology

## 2013-06-01 ENCOUNTER — Encounter (HOSPITAL_COMMUNITY): Payer: Self-pay | Admitting: Anesthesiology

## 2013-06-01 ENCOUNTER — Encounter (HOSPITAL_COMMUNITY)
Admission: RE | Disposition: A | Discharge status: Home / Self Care | Payer: Self-pay | Source: Ambulatory Visit | Attending: Obstetrics & Gynecology

## 2013-06-01 ENCOUNTER — Encounter (HOSPITAL_COMMUNITY): Payer: Self-pay | Admitting: *Deleted

## 2013-06-01 ENCOUNTER — Inpatient Hospital Stay (HOSPITAL_COMMUNITY): Payer: Self-pay

## 2013-06-01 ENCOUNTER — Inpatient Hospital Stay (HOSPITAL_COMMUNITY): Payer: Self-pay | Admitting: Anesthesiology

## 2013-06-01 DIAGNOSIS — N920 Excessive and frequent menstruation with regular cycle: Principal | ICD-10-CM | POA: Diagnosis present

## 2013-06-01 DIAGNOSIS — N858 Other specified noninflammatory disorders of uterus: Secondary | ICD-10-CM

## 2013-06-01 DIAGNOSIS — N852 Hypertrophy of uterus: Secondary | ICD-10-CM | POA: Diagnosis present

## 2013-06-01 DIAGNOSIS — D509 Iron deficiency anemia, unspecified: Secondary | ICD-10-CM

## 2013-06-01 DIAGNOSIS — D250 Submucous leiomyoma of uterus: Secondary | ICD-10-CM | POA: Diagnosis present

## 2013-06-01 DIAGNOSIS — J811 Chronic pulmonary edema: Secondary | ICD-10-CM | POA: Diagnosis present

## 2013-06-01 DIAGNOSIS — D252 Subserosal leiomyoma of uterus: Secondary | ICD-10-CM | POA: Diagnosis present

## 2013-06-01 DIAGNOSIS — N83209 Unspecified ovarian cyst, unspecified side: Secondary | ICD-10-CM | POA: Diagnosis present

## 2013-06-01 DIAGNOSIS — D251 Intramural leiomyoma of uterus: Secondary | ICD-10-CM | POA: Diagnosis present

## 2013-06-01 DIAGNOSIS — I959 Hypotension, unspecified: Secondary | ICD-10-CM | POA: Diagnosis not present

## 2013-06-01 DIAGNOSIS — N9489 Other specified conditions associated with female genital organs and menstrual cycle: Secondary | ICD-10-CM

## 2013-06-01 HISTORY — PX: ABDOMINAL HYSTERECTOMY: SHX81

## 2013-06-01 HISTORY — PX: CYSTOSCOPY W/ URETERAL STENT PLACEMENT: SHX1429

## 2013-06-01 HISTORY — PX: BILATERAL SALPINGECTOMY: SHX5743

## 2013-06-01 LAB — CBC
HCT: 37.1 % (ref 36.0–46.0)
HCT: 33.1 % — ABNORMAL LOW (ref 36.0–46.0)
Hemoglobin: 12.4 g/dL (ref 12.0–15.0)
MCHC: 33.4 g/dL (ref 30.0–36.0)
MCV: 84.1 fL (ref 78.0–100.0)
Platelets: 145 10*3/uL — ABNORMAL LOW (ref 150–400)
Platelets: 159 10*3/uL (ref 150–400)
RBC: 4.41 MIL/uL (ref 3.87–5.11)
RDW: 16.2 % — ABNORMAL HIGH (ref 11.5–15.5)
WBC: 18.3 10*3/uL — ABNORMAL HIGH (ref 4.0–10.5)
WBC: 13.9 10*3/uL — ABNORMAL HIGH (ref 4.0–10.5)

## 2013-06-01 LAB — PREPARE RBC (CROSSMATCH)

## 2013-06-01 SURGERY — HYSTERECTOMY, ABDOMINAL
Anesthesia: General | Site: Abdomen | Wound class: Clean Contaminated

## 2013-06-01 MED ORDER — FENTANYL CITRATE 0.05 MG/ML IJ SOLN
INTRAMUSCULAR | Status: DC | PRN
  Administered 2013-06-01: 25 ug via INTRAVENOUS
  Administered 2013-06-01 (×3): 50 ug via INTRAVENOUS
  Administered 2013-06-01: 25 ug via INTRAVENOUS
  Administered 2013-06-01: 50 ug via INTRAVENOUS

## 2013-06-01 MED ORDER — PHENYLEPHRINE HCL 10 MG/ML IJ SOLN: 10.0000 mg | INTRAVENOUS | Status: DC | PRN

## 2013-06-01 MED ORDER — PHENYLEPHRINE HCL 10 MG/ML IJ SOLN
10.0000 mg | INTRAMUSCULAR | Status: DC | PRN
  Administered 2013-06-01: 80 ug/min via INTRAVENOUS

## 2013-06-01 MED ORDER — CEFAZOLIN SODIUM-DEXTROSE 2-3 GM-% IV SOLR
INTRAVENOUS | Status: AC
  Filled 2013-06-01: qty 50

## 2013-06-01 MED ORDER — ONDANSETRON HCL 4 MG/2ML IJ SOLN: 4.0000 mg | Freq: Four times a day (QID) | INTRAMUSCULAR | Status: DC | PRN

## 2013-06-01 MED ORDER — FENTANYL CITRATE 0.05 MG/ML IJ SOLN
INTRAMUSCULAR | Status: AC
  Filled 2013-06-01: qty 5

## 2013-06-01 MED ORDER — BUPIVACAINE LIPOSOME 1.3 % IJ SUSP
20.0000 mL | Freq: Once | INTRAMUSCULAR | Status: AC
  Administered 2013-06-01: 20 mL
  Filled 2013-06-01: qty 20

## 2013-06-01 MED ORDER — KETOROLAC TROMETHAMINE 30 MG/ML IJ SOLN: 15.0000 mg | Freq: Once | INTRAMUSCULAR | Status: DC | PRN

## 2013-06-01 MED ORDER — LACTATED RINGERS IV SOLN: INTRAVENOUS | Status: DC

## 2013-06-01 MED ORDER — SODIUM CHLORIDE 0.9 % IJ SOLN
INTRAMUSCULAR | Status: AC
  Filled 2013-06-01: qty 20

## 2013-06-01 MED ORDER — DEXAMETHASONE SODIUM PHOSPHATE 4 MG/ML IJ SOLN
INTRAMUSCULAR | Status: DC | PRN
  Administered 2013-06-01: 10 mg via INTRAVENOUS

## 2013-06-01 MED ORDER — PANTOPRAZOLE SODIUM 40 MG IV SOLR
40.0000 mg | Freq: Every day | INTRAVENOUS | Status: DC
  Administered 2013-06-01: 40 mg via INTRAVENOUS
  Filled 2013-06-01: qty 40

## 2013-06-01 MED ORDER — EPHEDRINE 5 MG/ML INJ
INTRAVENOUS | Status: AC
  Filled 2013-06-01: qty 10

## 2013-06-01 MED ORDER — PROPOFOL 10 MG/ML IV EMUL
INTRAVENOUS | Status: AC
  Filled 2013-06-01: qty 20

## 2013-06-01 MED ORDER — LIDOCAINE HCL (CARDIAC) 20 MG/ML IV SOLN
INTRAVENOUS | Status: DC | PRN
  Administered 2013-06-01: 50 mg via INTRAVENOUS

## 2013-06-01 MED ORDER — NALOXONE HCL 0.4 MG/ML IJ SOLN: 0.4000 mg | INTRAMUSCULAR | Status: DC | PRN

## 2013-06-01 MED ORDER — PROPOFOL 10 MG/ML IV BOLUS
INTRAVENOUS | Status: DC | PRN
  Administered 2013-06-01: 100 mg via INTRAVENOUS

## 2013-06-01 MED ORDER — DEXTROSE-NACL 5-0.45 % IV SOLN
INTRAVENOUS | Status: DC
  Administered 2013-06-01 – 2013-06-02 (×2): via INTRAVENOUS

## 2013-06-01 MED ORDER — NEOSTIGMINE METHYLSULFATE 1 MG/ML IJ SOLN
INTRAMUSCULAR | Status: DC | PRN
  Administered 2013-06-01: 3 mg via INTRAVENOUS

## 2013-06-01 MED ORDER — LIDOCAINE HCL (CARDIAC) 20 MG/ML IV SOLN
INTRAVENOUS | Status: AC
  Filled 2013-06-01: qty 5

## 2013-06-01 MED ORDER — PROMETHAZINE HCL 25 MG/ML IJ SOLN: 6.2500 mg | INTRAMUSCULAR | Status: DC | PRN

## 2013-06-01 MED ORDER — VASOPRESSIN 20 UNIT/ML IJ SOLN
INTRAMUSCULAR | Status: AC
  Filled 2013-06-01: qty 1

## 2013-06-01 MED ORDER — MIDAZOLAM HCL 2 MG/2ML IJ SOLN
INTRAMUSCULAR | Status: AC
  Filled 2013-06-01: qty 2

## 2013-06-01 MED ORDER — OXYCODONE-ACETAMINOPHEN 5-325 MG PO TABS: 1.0000 | ORAL_TABLET | ORAL | Status: DC | PRN

## 2013-06-01 MED ORDER — DEXAMETHASONE SODIUM PHOSPHATE 10 MG/ML IJ SOLN
INTRAMUSCULAR | Status: AC
  Filled 2013-06-01: qty 1

## 2013-06-01 MED ORDER — MIDAZOLAM HCL 2 MG/2ML IJ SOLN
INTRAMUSCULAR | Status: DC | PRN
  Administered 2013-06-01: 1 mg via INTRAVENOUS

## 2013-06-01 MED ORDER — MEPERIDINE HCL 25 MG/ML IJ SOLN: 6.2500 mg | INTRAMUSCULAR | Status: DC | PRN

## 2013-06-01 MED ORDER — GLYCOPYRROLATE 0.2 MG/ML IJ SOLN
INTRAMUSCULAR | Status: DC | PRN
  Administered 2013-06-01: .5 mg via INTRAVENOUS

## 2013-06-01 MED ORDER — GLYCOPYRROLATE 0.2 MG/ML IJ SOLN
INTRAMUSCULAR | Status: AC
  Filled 2013-06-01: qty 2

## 2013-06-01 MED ORDER — PHENYLEPHRINE HCL 10 MG/ML IJ SOLN
INTRAMUSCULAR | Status: DC | PRN
  Administered 2013-06-01: 40 ug via INTRAVENOUS
  Administered 2013-06-01: 120 ug via INTRAVENOUS
  Administered 2013-06-01: 40 ug via INTRAVENOUS
  Administered 2013-06-01 (×3): 80 ug via INTRAVENOUS
  Administered 2013-06-01: 40 ug via INTRAVENOUS
  Administered 2013-06-01 (×3): 80 ug via INTRAVENOUS
  Administered 2013-06-01: 40 ug via INTRAVENOUS

## 2013-06-01 MED ORDER — ROCURONIUM BROMIDE 100 MG/10ML IV SOLN
INTRAVENOUS | Status: DC | PRN
  Administered 2013-06-01: 40 mg via INTRAVENOUS
  Administered 2013-06-01 (×2): 10 mg via INTRAVENOUS

## 2013-06-01 MED ORDER — PHENYLEPHRINE HCL 10 MG/ML IJ SOLN
INTRAMUSCULAR | Status: AC
  Filled 2013-06-01: qty 1

## 2013-06-01 MED ORDER — KETOROLAC TROMETHAMINE 30 MG/ML IJ SOLN
INTRAMUSCULAR | Status: AC
  Filled 2013-06-01: qty 1

## 2013-06-01 MED ORDER — KETOROLAC TROMETHAMINE 30 MG/ML IJ SOLN
30.0000 mg | Freq: Three times a day (TID) | INTRAMUSCULAR | Status: AC
  Administered 2013-06-01 – 2013-06-02 (×2): 30 mg via INTRAVENOUS
  Filled 2013-06-01 (×3): qty 1

## 2013-06-01 MED ORDER — SODIUM CHLORIDE 0.9 % IR SOLN
Status: DC | PRN
  Administered 2013-06-01: 1000 mL

## 2013-06-01 MED ORDER — ONDANSETRON HCL 4 MG/2ML IJ SOLN
INTRAMUSCULAR | Status: AC
  Filled 2013-06-01: qty 2

## 2013-06-01 MED ORDER — DOCUSATE SODIUM 100 MG PO CAPS
100.0000 mg | ORAL_CAPSULE | Freq: Two times a day (BID) | ORAL | Status: DC
  Administered 2013-06-01 – 2013-06-03 (×3): 100 mg via ORAL
  Filled 2013-06-01 (×3): qty 1

## 2013-06-01 MED ORDER — NEOSTIGMINE METHYLSULFATE 1 MG/ML IJ SOLN
INTRAMUSCULAR | Status: AC
  Filled 2013-06-01: qty 1

## 2013-06-01 MED ORDER — LACTATED RINGERS IV SOLN
INTRAVENOUS | Status: DC
  Administered 2013-06-01: 125 mL/h via INTRAVENOUS
  Administered 2013-06-01 (×3): via INTRAVENOUS

## 2013-06-01 MED ORDER — ROCURONIUM BROMIDE 100 MG/10ML IV SOLN
INTRAVENOUS | Status: AC
  Filled 2013-06-01: qty 1

## 2013-06-01 MED ORDER — GLYCOPYRROLATE 0.2 MG/ML IJ SOLN
INTRAMUSCULAR | Status: AC
  Filled 2013-06-01: qty 1

## 2013-06-01 MED ORDER — DIPHENHYDRAMINE HCL 50 MG/ML IJ SOLN: 12.5000 mg | Freq: Four times a day (QID) | INTRAMUSCULAR | Status: DC | PRN

## 2013-06-01 MED ORDER — MENTHOL 3 MG MT LOZG: 1.0000 | LOZENGE | OROMUCOSAL | Status: DC | PRN

## 2013-06-01 MED ORDER — MORPHINE SULFATE (PF) 1 MG/ML IV SOLN
INTRAVENOUS | Status: DC
  Administered 2013-06-01: 13:00:00 via INTRAVENOUS
  Filled 2013-06-01: qty 25

## 2013-06-01 MED ORDER — SODIUM CHLORIDE 0.9 % IJ SOLN
INTRAMUSCULAR | Status: DC | PRN
  Administered 2013-06-01: 20 mL via INTRAVENOUS

## 2013-06-01 MED ORDER — BUPIVACAINE HCL (PF) 0.5 % IJ SOLN
INTRAMUSCULAR | Status: AC
  Filled 2013-06-01: qty 30

## 2013-06-01 MED ORDER — PHENYLEPHRINE 40 MCG/ML (10ML) SYRINGE FOR IV PUSH (FOR BLOOD PRESSURE SUPPORT)
PREFILLED_SYRINGE | INTRAVENOUS | Status: AC
  Filled 2013-06-01: qty 15

## 2013-06-01 MED ORDER — EPHEDRINE SULFATE 50 MG/ML IJ SOLN
INTRAMUSCULAR | Status: DC | PRN
  Administered 2013-06-01 (×2): 10 mg via INTRAVENOUS

## 2013-06-01 MED ORDER — LURASIDONE HCL 40 MG PO TABS
40.0000 mg | ORAL_TABLET | Freq: Every day | ORAL | Status: DC
  Administered 2013-06-02 – 2013-06-03 (×2): 40 mg via ORAL
  Filled 2013-06-01 (×3): qty 1

## 2013-06-01 MED ORDER — SODIUM CHLORIDE 0.9 % IJ SOLN: 9.0000 mL | INTRAMUSCULAR | Status: DC | PRN

## 2013-06-01 MED ORDER — VASOPRESSIN 20 UNIT/ML IJ SOLN
INTRAVENOUS | Status: DC | PRN
  Administered 2013-06-01: 08:00:00 via INTRAMUSCULAR

## 2013-06-01 MED ORDER — ONDANSETRON HCL 4 MG PO TABS: 4.0000 mg | ORAL_TABLET | Freq: Four times a day (QID) | ORAL | Status: DC | PRN

## 2013-06-01 MED ORDER — HYDROMORPHONE HCL PF 1 MG/ML IJ SOLN
0.2500 mg | INTRAMUSCULAR | Status: DC | PRN
  Administered 2013-06-01: 0.5 mg via INTRAVENOUS

## 2013-06-01 MED ORDER — KETOROLAC TROMETHAMINE 30 MG/ML IJ SOLN: 30.0000 mg | Freq: Three times a day (TID) | INTRAMUSCULAR | Status: AC

## 2013-06-01 MED ORDER — KETOROLAC TROMETHAMINE 30 MG/ML IJ SOLN
30.0000 mg | Freq: Once | INTRAMUSCULAR | Status: AC
  Administered 2013-06-01: 30 mg via INTRAVENOUS

## 2013-06-01 MED ORDER — SIMETHICONE 80 MG PO CHEW: 80.0000 mg | CHEWABLE_TABLET | Freq: Four times a day (QID) | ORAL | Status: DC | PRN

## 2013-06-01 MED ORDER — POLYETHYLENE GLYCOL 3350 17 G PO PACK
17.0000 g | PACK | Freq: Every day | ORAL | Status: DC | PRN
  Filled 2013-06-01: qty 1

## 2013-06-01 MED ORDER — DIPHENHYDRAMINE HCL 12.5 MG/5ML PO ELIX: 12.5000 mg | ORAL_SOLUTION | Freq: Four times a day (QID) | ORAL | Status: DC | PRN

## 2013-06-01 MED ORDER — PHENYLEPHRINE 40 MCG/ML (10ML) SYRINGE FOR IV PUSH (FOR BLOOD PRESSURE SUPPORT)
PREFILLED_SYRINGE | INTRAVENOUS | Status: AC
  Filled 2013-06-01: qty 5

## 2013-06-01 MED ORDER — SODIUM CHLORIDE 0.9 % IJ SOLN
INTRAMUSCULAR | Status: AC
  Filled 2013-06-01: qty 50

## 2013-06-01 MED ORDER — CEFAZOLIN SODIUM-DEXTROSE 2-3 GM-% IV SOLR
2.0000 g | INTRAVENOUS | Status: AC
  Administered 2013-06-01 (×2): 2 g via INTRAVENOUS

## 2013-06-01 MED ORDER — STERILE WATER FOR IRRIGATION IR SOLN
Status: DC | PRN
  Administered 2013-06-01: 1000 mL

## 2013-06-01 MED ORDER — SODIUM CHLORIDE 0.9 % IV SOLN
INTRAVENOUS | Status: DC | PRN
  Administered 2013-06-01: 09:00:00 via INTRAVENOUS

## 2013-06-01 MED ORDER — HYDROMORPHONE HCL PF 1 MG/ML IJ SOLN
INTRAMUSCULAR | Status: AC
  Administered 2013-06-01: 0.5 mg via INTRAVENOUS
  Filled 2013-06-01: qty 1

## 2013-06-01 MED ORDER — ONDANSETRON HCL 4 MG/2ML IJ SOLN
INTRAMUSCULAR | Status: DC | PRN
  Administered 2013-06-01: 4 mg via INTRAVENOUS

## 2013-06-01 MED ORDER — ALBUTEROL SULFATE HFA 108 (90 BASE) MCG/ACT IN AERS
2.0000 | INHALATION_SPRAY | Freq: Four times a day (QID) | RESPIRATORY_TRACT | Status: DC
  Administered 2013-06-01 – 2013-06-03 (×5): 2 via RESPIRATORY_TRACT
  Filled 2013-06-01: qty 6.7

## 2013-06-01 MED ORDER — IBUPROFEN 600 MG PO TABS
600.0000 mg | ORAL_TABLET | Freq: Four times a day (QID) | ORAL | Status: DC
  Administered 2013-06-03: 600 mg via ORAL
  Filled 2013-06-01: qty 1

## 2013-06-01 SURGICAL SUPPLY — 52 items
ADAPTER GOLDBERG URETERAL (ADAPTER) ×4 IMPLANT
BARRIER ADHS 3X4 INTERCEED (GAUZE/BANDAGES/DRESSINGS) IMPLANT
BENZOIN TINCTURE PRP APPL 2/3 (GAUZE/BANDAGES/DRESSINGS) ×4 IMPLANT
BINDER ABD UNIV 12 45-62 (WOUND CARE) ×3 IMPLANT
BINDER ABDOMINAL 46IN 62IN (WOUND CARE) ×4
CANISTER SUCT 3000ML (MISCELLANEOUS) ×4 IMPLANT
CATH URET 5FR 28IN OPEN ENDED (CATHETERS) ×8 IMPLANT
CELLS DAT CNTRL 66122 CELL SVR (MISCELLANEOUS) IMPLANT
CHLORAPREP W/TINT 26ML (MISCELLANEOUS) ×4 IMPLANT
CLOTH BEACON ORANGE TIMEOUT ST (SAFETY) ×4 IMPLANT
CONT PATH 16OZ SNAP LID 3702 (MISCELLANEOUS) ×4 IMPLANT
DECANTER SPIKE VIAL GLASS SM (MISCELLANEOUS) ×8 IMPLANT
DRAPE C-ARM 42X72 X-RAY (DRAPES) ×4 IMPLANT
DRAPE WARM FLUID 44X44 (DRAPE) ×4 IMPLANT
DRESSING TELFA 8X3 (GAUZE/BANDAGES/DRESSINGS) ×4 IMPLANT
DRSG OPSITE POSTOP 4X10 (GAUZE/BANDAGES/DRESSINGS) ×4 IMPLANT
GAUZE SPONGE 4X4 16PLY XRAY LF (GAUZE/BANDAGES/DRESSINGS) ×4 IMPLANT
GLOVE BIO SURGEON STRL SZ7 (GLOVE) ×8 IMPLANT
GLOVE BIOGEL PI IND STRL 7.0 (GLOVE) ×6 IMPLANT
GLOVE BIOGEL PI INDICATOR 7.0 (GLOVE) ×2
GOWN STRL REIN XL XLG (GOWN DISPOSABLE) ×12 IMPLANT
HEMOSTAT SURGICEL 4X8 (HEMOSTASIS) ×4 IMPLANT
NEEDLE HYPO 22GX1.5 SAFETY (NEEDLE) ×4 IMPLANT
NS IRRIG 1000ML POUR BTL (IV SOLUTION) ×4 IMPLANT
PACK ABDOMINAL GYN (CUSTOM PROCEDURE TRAY) ×4 IMPLANT
PAD ABD 7.5X8 STRL (GAUZE/BANDAGES/DRESSINGS) ×4 IMPLANT
PAD OB MATERNITY 4.3X12.25 (PERSONAL CARE ITEMS) ×4 IMPLANT
PROTECTOR NERVE ULNAR (MISCELLANEOUS) ×4 IMPLANT
RETRACTOR WND ALEXIS 25 LRG (MISCELLANEOUS) IMPLANT
RTRCTR WOUND ALEXIS 18CM MED (MISCELLANEOUS)
RTRCTR WOUND ALEXIS 25CM LRG (MISCELLANEOUS)
SET CYSTO W/LG BORE CLAMP LF (SET/KITS/TRAYS/PACK) ×4 IMPLANT
SPONGE GAUZE 4X4 12PLY (GAUZE/BANDAGES/DRESSINGS) ×8 IMPLANT
SPONGE LAP 18X18 X RAY DECT (DISPOSABLE) ×20 IMPLANT
STAPLER VISISTAT 35W (STAPLE) ×4 IMPLANT
STRIP CLOSURE SKIN 1/2X4 (GAUZE/BANDAGES/DRESSINGS) ×4 IMPLANT
SUT VIC AB 0 CT1 18XCR BRD8 (SUTURE) ×12 IMPLANT
SUT VIC AB 0 CT1 27 (SUTURE) ×2
SUT VIC AB 0 CT1 27XBRD ANBCTR (SUTURE) ×6 IMPLANT
SUT VIC AB 0 CT1 36 (SUTURE) ×4 IMPLANT
SUT VIC AB 0 CT1 8-18 (SUTURE) ×4
SUT VIC AB 3-0 CT1 27 (SUTURE) ×2
SUT VIC AB 3-0 CT1 TAPERPNT 27 (SUTURE) ×6 IMPLANT
SUT VIC AB 3-0 SH 27 (SUTURE) ×1
SUT VIC AB 3-0 SH 27X BRD (SUTURE) ×3 IMPLANT
SUT VIC AB 4-0 KS 27 (SUTURE) ×4 IMPLANT
SUT VICRYL 0 TIES 12 18 (SUTURE) ×4 IMPLANT
SYR CONTROL 10ML LL (SYRINGE) ×8 IMPLANT
TAPE CLOTH SURG 4X10 WHT LF (GAUZE/BANDAGES/DRESSINGS) ×4 IMPLANT
TOWEL OR 17X24 6PK STRL BLUE (TOWEL DISPOSABLE) ×8 IMPLANT
TRAY FOLEY CATH 14FR (SET/KITS/TRAYS/PACK) ×8 IMPLANT
WATER STERILE IRR 1000ML POUR (IV SOLUTION) ×4 IMPLANT

## 2013-06-01 NOTE — Anesthesia Preprocedure Evaluation (Signed)
Anesthesia Evaluation  Patient identified by MRN, date of birth, ID band Patient awake    Reviewed: Allergy & Precautions, H&P , NPO status , Patient's Chart, lab work & pertinent test results  Airway Mallampati: II TM Distance: >3 FB Neck ROM: full    Dental no notable dental hx. (+) Teeth Intact   Pulmonary asthma ,  Used Albuterol inhaler yesterday and will use it prior to OR.   Pulmonary exam normal       Cardiovascular negative cardio ROS      Neuro/Psych PSYCHIATRIC DISORDERS Bipolar Disorder negative neurological ROS     GI/Hepatic negative GI ROS, Neg liver ROS,   Endo/Other  negative endocrine ROS  Renal/GU negative Renal ROS  negative genitourinary   Musculoskeletal negative musculoskeletal ROS (+)   Abdominal Normal abdominal exam  (+)   Peds  Hematology negative hematology ROS (+)   Anesthesia Other Findings   Reproductive/Obstetrics negative OB ROS                           Anesthesia Physical Anesthesia Plan  ASA: II  Anesthesia Plan: General   Post-op Pain Management:    Induction: Intravenous  Airway Management Planned: Oral ETT  Additional Equipment:   Intra-op Plan:   Post-operative Plan: Extubation in OR  Informed Consent: I have reviewed the patients History and Physical, chart, labs and discussed the procedure including the risks, benefits and alternatives for the proposed anesthesia with the patient or authorized representative who has indicated his/her understanding and acceptance.   Dental Advisory Given  Plan Discussed with: CRNA and Surgeon  Anesthesia Plan Comments:         Anesthesia Quick Evaluation

## 2013-06-01 NOTE — Op Note (Signed)
NAMEWILLER, OSORNO                ACCOUNT NO.:  [id number]  MEDICAL RECORD NO.:  [id number]  LOCATION:  [facility]                          FACILITY:  [facility]  PHYSICIAN:  Excell Seltzer. Annabell Howells, M.D.    DATE OF BIRTH:  November 29, 1980  DATE OF PROCEDURE:  06/01/2013 DATE OF DISCHARGE:                              OPERATIVE REPORT   PROCEDURE:  Cystoscopy with placement of bilateral ureteral catheters.  PREOPERATIVE DIAGNOSIS:  Massive uterine fibroids.  POSTOPERATIVE DIAGNOSIS:  Massive uterine fibroids.  SURGEON:  Excell Seltzer. Annabell Howells, MD  ANESTHESIA:  General.  SPECIMEN:  None.  DRAINS:  Bilateral 5-French ureteral catheters and a 16-French Foley catheter.  COMPLICATIONS:  None.  INDICATIONS:  Sherri Shaw is a 32 year old African American female with massive uterine fibroid cyst undergo open hysterectomy today.  Dr. Erin Fulling requested placement of ureteral catheters to aid ureteral visualization.  FINDINGS OF PROCEDURE:  The patient was taken to the operating room where general anesthetic was induced.  She was prepped with ChloraPrep on the abdomen and Betadine in the vaginal area after a surgical clip was performed.  She was then draped in usual sterile fashion.  Cystoscopy was performed using a 22-French scope and 12-degree lens. Examination revealed a normal urethra.  The bladder wall was smooth without tumor, stones, or inflammation.  Ureteral orifices were unremarkable.  The right ureteral orifice was cannulated with 5-French open-end catheter which was then passed through the kidney without difficulty. This was then repeated on the left once again without difficulty.  At this point, the cystoscope was removed.  A 16-French Foley catheter was inserted.  The balloon was filled with 10 mL sterile fluid.  The ureteral catheters and Foley were connected to a [place] apparatus to provide drainage and that was connected to a drainage bag.  The ureteral catheters were secured to the Foley with  0 silk ties to prevent migration.  At this point, the procedure was turned over to Dr. Erin Fulling for her portion.  There were no complications during the cystoscopy.     Excell Seltzer. Annabell Howells, M.D.     JJW/MEDQ  D:  06/01/2013  T:  06/01/2013  Job:  [id number]

## 2013-06-01 NOTE — Brief Op Note (Signed)
06/01/2013  10:05 AM  PATIENT:  Sherri Shaw  32 y.o. female  PRE-OPERATIVE DIAGNOSIS:  Massively enlarged uterus with numerous uterine fibroids and undesired fertility  POST-OPERATIVE DIAGNOSIS:  * No post-op diagnosis entered *  PROCEDURE:  Procedure(s): HYSTERECTOMY ABDOMINAL (N/A) BILATERAL SALPINGECTOMY (Bilateral) CYSTOSCOPY WITH BILATERAL STENT REPLACEMENT (Bilateral)  SURGEON:  Surgeon(s) and Role: Panel 1:    * Willodean Rosenthal, MD - Primary    * Adam Phenix, MD - Assisting  Panel 2:    * Bjorn Pippin, MD - Primary  ANESTHESIA:   general  EBL:  Total I/O In: 4054 [I.V.:2800; Blood:1254] Out: 2100 [Urine:100; Blood:2000]  BLOOD ADMINISTERED:1250 CC PRBC  DRAINS: none   LOCAL MEDICATIONS USED:  OTHER Exparel 20cc with 20cc of sterile saline   SPECIMEN:  Source of Specimen:  uteru, cervix and fallopin tubes  DISPOSITION OF SPECIMEN:  PATHOLOGY  COUNTS:  YES  TOURNIQUET:  * No tourniquets in log *  DICTATION: .Note written in EPIC  PLAN OF CARE: Admit to inpatient   PATIENT DISPOSITION:  PACU - hemodynamically stable.   Delay start of Pharmacological VTE agent (>24hrs) due to surgical blood loss or risk of bleeding: yes

## 2013-06-01 NOTE — H&P (Signed)
HPI Pt presents for hospital f/u. She was recently discharged from [facility] hospital. She was admitted for bilateral LE edema and anemia. Was discovered to have a large pelvic mass. Pt reports that it has been there for 'some years'. Her sister confirms that her abd has been enlarged since she moved here from [place] in 2010. Pt reports heavy bleeding but, does not know if she has clots. It is difficult to ascertain how heavy the bleeding is but, it appears to last 7 to 10 days.  Pt is her own legal guardian but, her sister is trying to obtain guardianship. She lives with her sister. The history was obtained mostly by patient with her sister providing details when available. The sister is not aware of exactly how long of often the pt is bleeding.  Past Medical History   Diagnosis  Date   .  Asthma    .  Chronic bronchitis      "I get it q yr" (04/07/2013)   .  Exertional shortness of breath    .  Anemia    .  History of blood transfusion  04/07/2013     "got my first transfusion today; got 4 units" (04/07/2013)   .  Bipolar disorder      sister denies this hx on 04/07/2013   .  Schizophrenia      "don't really know the type"/sister (04/07/2013)   .  Microcytic anemia      Hattie Perch 04/07/2013    Past Surgical History   Procedure  Laterality  Date   .  No past surgeries      Current Outpatient Prescriptions on File Prior to Visit   Medication  Sig  Dispense  Refill   .  albuterol (PROVENTIL HFA;VENTOLIN HFA) 108 (90 BASE) MCG/ACT inhaler  Inhale 2 puffs into the lungs every 6 (six) hours as needed for wheezing.     Marland Kitchen  lurasidone (LATUDA) 40 MG TABS tablet  Take 40 mg by mouth daily with breakfast.      No current facility-administered medications on file prior to visit.    History    Social History   .  Marital Status:  Single     Spouse Name:  N/A     Number of Children:  N/A   .  Years of Education:  Coll    Occupational History   .  Not on file.    Social History Main Topics   .  Smoking  status:  Never Smoker   .  Smokeless tobacco:  Never Used   .  Alcohol Use:  No   .  Drug Use:  No   .  Sexual Activity:  Not Currently    Other Topics  Concern   .  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  Hypertension  Mother    .  Diabetes  Mother    .  Asthma  Mother    .  Asthma  Brother     Current Outpatient Prescriptions on File Prior to Visit   Medication  Sig  Dispense  Refill   .  albuterol (PROVENTIL HFA;VENTOLIN HFA) 108 (90 BASE) MCG/ACT inhaler  Inhale 2 puffs into the lungs every 6 (six) hours as needed for wheezing.     Marland Kitchen  lurasidone (LATUDA) 40 MG TABS tablet  Take 40 mg by mouth daily with breakfast.      No current facility-administered medications on file prior to visit.   Review of Systems  Objective:   Physical Exam. BP 106/73  Pulse 66  Temp(Src) 98.8 F (37.1 C) (Oral)  Resp 18  SpO2 100%  LMP 05/25/2013  Lungs: CTA  CV: RRR  Abd: pt looks like she has a term pregnancy  FH 6-8cm above umbilicus  GU:  EGBUS: no lesions  Vagina: large amount of blood in vault  Cervix: no lesion; no mucopurulent d/c  Uterus: enlarged and lobular  Adnexa: unable to fully assess due to mass effect  Ext: bilateral lower ext edema- 2+; symetric  CBC    Component  Value  Date/Time    WBC  6.5  04/08/2013 0512    RBC  4.14  04/08/2013 0512    RBC  2.86*  04/07/2013 0005    HGB  9.1*  04/08/2013 0512    HCT  28.7*  04/08/2013 0512    PLT  619*  04/08/2013 0512    MCV  69.3*  04/08/2013 0512    MCH  22.0*  04/08/2013 0512    MCHC  31.7  04/08/2013 0512    RDW  25.4*  04/08/2013 0512    LYMPHSABS  1.6  04/06/2013 2322    MONOABS  0.5  04/06/2013 2322    EOSABS  0.4  04/06/2013 2322    BASOSABS  0.1  04/06/2013 2322   04/07/2013 Clinical Data: Leg swelling. Abdominal distention.  CT ABDOMEN AND PELVIS WITHOUT CONTRAST  Technique:  Multidetector CT imaging of the abdomen and pelvis was  performed following the standard protocol without intravenous  contrast.  Comparison: Abdominal radiograph 04/06/2013  Findings: Lung bases: The lung bases are clear.  Abdomen/pelvis: The uterus is massively enlarged and contains  innumerable soft tissue masses within it. These masses most likely  reflect multiple fibroids. Three of the fibroids are at least  partially calcified, the one in the right aspect of the uterus  accounts for the calcification seen on today's abdominal  radiograph. The uterus measures approximately 28 cm in sagittal  length, and extends approximately 7 cm superior to the patient's  umbilicus. Uterus measures up to 19 cm transverse diameter and up  to 17 cm AP diameter, and nearly occupies the entire lower abdomen  and upper pelvis. The largest uterine mass is within the inferior  uterus and measures up to 9.7 x 11 centimeters. Fluid density oval  mass wit in the right posterior aspect of the uterus likely  reflects a degenerating/infarcting fibroid, which can be a source  of pain.  The enlarged uterus causes mass effect on the bowel loops, and  likely causes mass effect upon the inferior vena cava and abdominal  aorta. It may be the cause of the patient's leg swelling. Due to  the presence of mass in the absence of contrast, the abdominal  aorta and vena cava not well visualized.  The liver, spleen, adrenal glands, pancreas, and kidneys show no  abnormality on noncontrast imaging. Urinary bladder is  unremarkable.  Inguinal lymph nodes are mildly prominent bilaterally, measuring up  to 14 x 14 mm on the right and 16 x 15 mm on the left.  Evaluation for intra-abdominal lymphadenopathy is limited given the  lack of contrast and the large uterine size.  No acute osseous abnormality.  IMPRESSION:  1. Massively enlarged uterus (approximately 28  x 19 x 17 cm)  contains multiple masses, likel fibroids. Given  the large size of  uterus and large size of the masses, sarcomatous degeneration of  fibroids cannot be excluded. The uterus extends 7 cm cephalad to  the umbilicus and causes mass effect on the abdominal bowel loops,  likely on the aorta and inferior vena cava, and may be causing the  patient's leg swelling. Gynecology consultation is recommended.  2. Prominent inguinal lymph nodes bilaterally.  04/08/2013 MRI PELVIS WITHOUT AND WITH CONTRAST  Technique: Multiplanar multisequence MR imaging of the pelvis was  performed both before and after the administration of intravenous  contrast.  Contrast: 12 ml Multihance IV  Comparison: Unenhanced CT abdomen pelvis dated 04/06/2013.  Findings: Massively enlarged uterus with numerous fibroids,  measuring approximately 13.8 x 20.2 x 20.5 cm.  Representative fibroids include:  --5.4 x 7.4 cm pedunculated fibroid along the right uterine fundus  (series 3/image 2)  --7.6 x 8.4 cm submucosal fibroid in the right posterior uterine  body (series 3/image 15)  --10.5 x 13.1 cm intramural fibroid in the left lower uterine body  (series 3/image 25)  Majority of the fibroids enhance heterogeneously following contrast  administration, with the exception of four small fibroids measuring  up to 5.1 cm (series 2002/image 62).  Endometrial complex is effaced/distorted.  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 The right gonadal vein is  enlarged/prominent (series 2002/image 71) but patent.  5.2 x 4.2 cm cystic lesion in the right mid abdomen is favored to  reflect an ovarian cyst (series 3/image 15).  Suspected left ovary measures 3.2 x 3.3 cm and is unremarkable  (series 2002/image 50).  Bladder is within normal limits.  No pelvic ascites.  No suspicious pelvic lymphadenopathy. Small bilateral inguinal  lymph nodes measuring up to 11-12 mm short-axis are within normal  limits and likely reactive  (series 17/image 25).  On coronal imaging, the visualized kidneys are unremarkable,  without hydronephrosis.  No focal osseous lesions.  IMPRESSION:  Massively enlarged uterus with numerous uterine fibroids, as  described above.  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 Enlarged/prominent right gonadal  vein.  Suspected 5.2 cm right ovarian cyst. Left ovary is within normal  limits.  No suspicious pelvic lymphadenopathy. Small inguinal nodes are  within normal limits and likely reactive.  Assessment:   Symptomatic uterine fibroids- d/w pt and her sister treatment options. Pt does not have any plans for child bearing in the future so a myomectomy is not considered an option. They want to proceed with definitve treatement with hyst.  Plan:   Megace 40mg  bid  FeSO4  Patient desires surgical management with hysterectomy with bilateral salpingectomy after placement of ureteral stents preop. The risks of surgery were discussed in detail with the patient (who is her own legal guardian and her sister who she lives with) including but not limited to: bleeding which may require transfusion or reoperation; infection which may require prolonged hospitalization or re-hospitalization and antibiotic therapy; injury to bowel, bladder, ureters and major vessels or other surrounding organs; need for additional procedures including laparotomy; thromboembolic phenomenon, incisional problems and other postoperative or anesthesia complications. Patient was told that the likelihood that her condition and symptoms will be treated effectively with this surgical management was very high; the postoperative expectations were also discussed in detail. The patient also understands the alternative treatment options which were  discussed in full. All questions were answered.

## 2013-06-01 NOTE — Transfer of Care (Signed)
Immediate Anesthesia Transfer of Care Note  Patient: Sherri Shaw  Procedure(s) Performed: Procedure(s): HYSTERECTOMY ABDOMINAL (N/A) BILATERAL SALPINGECTOMY (Bilateral) CYSTOSCOPY WITH BILATERAL STENT REPLACEMENT (Bilateral)  Patient Location: PACU  Anesthesia Type:General  Level of Consciousness: awake, oriented and patient cooperative  Airway & Oxygen Therapy: Patient Spontanous Breathing and Patient connected to nasal cannula oxygen  Post-op Assessment: Report given to PACU RN and Post -op Vital signs reviewed and stable  Post vital signs: Reviewed and stable  Complications: No apparent anesthesia complications

## 2013-06-01 NOTE — Progress Notes (Signed)
Phlebotomy and blood bank state that protocol for CBC post-transfusion is 2 hours after last transfusion given.  Dr Erin Fulling notified and wants CBC stat now.  Awaiting lab to draw blood for CBC.

## 2013-06-01 NOTE — Addendum Note (Signed)
Addendum created 06/01/13 1351 by Earmon Phoenix, CRNA   Modules edited: Anesthesia Flowsheet

## 2013-06-01 NOTE — Progress Notes (Signed)
Day of Surgery Procedure(s) (LRB): HYSTERECTOMY ABDOMINAL (N/A) BILATERAL SALPINGECTOMY (Bilateral) CYSTOSCOPY WITH BILATERAL STENT REPLACEMENT (Bilateral)  Subjective: Patient reports no problems.  She is feeling hungry. She denies feeling dizzy or SOB  Objective: I have reviewed patient's vital signs, intake and output, medications and labs.  GI: incision: clean, dry and dressing in place CBC    Component Value Date/Time   WBC 13.9* 06/01/2013 1553   RBC 4.06 06/01/2013 1553   RBC 2.86* 04/07/2013 0005   HGB 11.5* 06/01/2013 1553   HCT 33.1* 06/01/2013 1553   PLT 159 06/01/2013 1553   MCV 81.5 06/01/2013 1553   MCH 28.3 06/01/2013 1553   MCHC 34.7 06/01/2013 1553   RDW 16.2* 06/01/2013 1553   LYMPHSABS 1.6 04/06/2013 2322   MONOABS 0.5 04/06/2013 2322   EOSABS 0.4 04/06/2013 2322   BASOSABS 0.1 04/06/2013 2322     Assessment: s/p Procedure(s): HYSTERECTOMY ABDOMINAL (N/A) BILATERAL SALPINGECTOMY (Bilateral) CYSTOSCOPY WITH BILATERAL STENT REPLACEMENT (Bilateral): stable  Plan: Clear liquids OOB to chair tonight  LOS: 0 days    HARRAWAY-SMITH, Fujiko Picazo 06/01/2013, 5:06 PM

## 2013-06-01 NOTE — Op Note (Signed)
06/01/2013  10:05 AM  PATIENT:  Sherri Shaw  32 y.o. female  PRE-OPERATIVE DIAGNOSIS:  Massively enlarged uterus with numerous uterine fibroids and undesired fertility  POST-OPERATIVE DIAGNOSIS:  * No post-op diagnosis entered *  PROCEDURE:  Procedure(s): HYSTERECTOMY ABDOMINAL (N/A) BILATERAL SALPINGECTOMY (Bilateral) CYSTOSCOPY WITH BILATERAL STENT REPLACEMENT (Bilateral)  SURGEON:  Surgeon(s) and Role: Panel 1:    * Willodean Rosenthal, MD - Primary    * Adam Phenix, MD - Assisting  Panel 2:    * Bjorn Pippin, MD - Primary  ANESTHESIA:   general  EBL:  Total I/O In: 4054 [I.V.:2800; Blood:1254] Out: 2100 [Urine:100; Blood:2000]  BLOOD ADMINISTERED:1250 CC PRBC  DRAINS: none   LOCAL MEDICATIONS USED:  OTHER Exparel 20cc with 20cc of sterile saline   SPECIMEN:  Source of Specimen:  uteru, cervix and fallopin tubes  DISPOSITION OF SPECIMEN:  PATHOLOGY  COUNTS:  YES  TOURNIQUET:  * No tourniquets in log *  DICTATION: .Note written in EPIC  PLAN OF CARE: Admit to inpatient   PATIENT DISPOSITION:  PACU - hemodynamically stable.   Delay start of Pharmacological VTE agent (>24hrs) due to surgical blood loss or risk of bleeding: yes  INDICATIONS: The patient is a 32y.o. G0 with the aforementioned diagnoses who desires definitive surgical management. On the preoperative visit, the risks, benefits, indications, and alternatives of the procedure were reviewed with the patient. On the day of surgery, the risks of surgery were again discussed with the patient including but not limited to: bleeding which may require transfusion or reoperation; infection which may require antibiotics; injury to bowel, bladder, ureters or other surrounding organs; need for additional procedures; thromboembolic phenomenon, incisional problems and other postoperative/anesthesia complications. Written informed consent was obtained.  OPERATIVE FINDINGS: A 30+ week sized fibroids uterus with  one large pedunculated fibroid, a very large posterior fibroid and multiple smaller fiboids with normal tubes and ovaries bilaterally.  SPECIMENS: Uterus,cervix, bilateral fallopian tubes sent to pathology  COMPLICATIONS: none.  DESCRIPTION OF PROCEDURE: The patient received intravenous antibiotics at the onset of the procedure and again at the end of the procedure due to excessive blood loss.  She had sequential compression devices applied to her lower extremities while in the preoperative area. She was taken to the operating room and placed under general anesthesia without difficulty. The abdomen and perineum were prepped and draped in a sterile manner, and she was placed in a dorsal supine position. Ureteral stents and a Foley catheter were inserted into the bladder and ureters by Dr. Annabell Howells, urology, prior to onset of the procedure.  These  Were attached to constant drainage. After an adequate timeout was performed, a transverse skin incision was made. This incision was taken down to the fascia using a scalpel and cautery with care given to maintain good hemostasis. The fascia was grasped with kocher clamps, tented up and the rectus muscles were dissected off using sharp and blunt dissection on the superior and inferior aspects of the fascial incision. The peritoneum entered sharply without complication. This peritoneal cavity was entered digitally. The rectus muscles were transected to attempt to create more space.  The uterus could not be mobilized so a myomectomy was performed removing several of the larger fibroids.  The uterus at this point was mobilized and was delivered up out of the abdomen. The bowel was packed away with moist laparotomy sponges. The round ligaments on each side were clamped, suture ligated with 0 Vicryl, and transected with electrocautery allowing entry into the  broad ligament. Of note, all sutures used in this procedure are 0 Vicryl unless otherwise noted. The anterior and posterior  leaves of the broad ligament were separated, and the ureters with stent in place was palpated and found to be safely away from the area of dissection bilaterally. The uteroovarian ligaments were bliaterally clamped and doubly suture ligated. The uterine arteries were skeletinized bilaterally A bladder flap was then created. The bladder was then bluntly dissected off the lower uterine segment and cervix with good hemostasis noted. The uterine arteries were then skeletonized bilaterally and then clamped, cut, and doubly suture ligated with care given to prevent ureteral injury. After the uterine arteries were clamped, transected and suture ligated.  The uterosacral ligaments were then clamped, cut, and ligated bilaterally. Finally, the cardinal ligaments were clamped, cut, and ligated bilaterally. The uterus was removed.  The cuff angles were closed with multiple figure-of-eight sutures.   At this point the fallopian tubes were grasped with a Babcock clamp.  Using a kelly clamp the mesosalpinx was clamped cut and suture ligated, excellent hemostasis was noted.  The pelvis was irrigated and hemostasis was reconfirmed at all pedicles and along the pelvic sidewall. The ureters were inspected and noted to be peristalsing bilaterally with stents in place.  All laparotomy sponges and instruments were removed from the abdomen. Surgicel was placed over the vaginal cuff and the pedicles after the pelvis was irrigated.  The peritoneum was reapproximated with 0 vicryl.  The fascia was closed with 0 vicryl . The subcutaneous fascia was closed with 3-0 vicryl and the skin was a subcuticular suture of 4-0 vicryl. The incision was injected with 20cc of Exparel mixed with 20cc of sterile saline. Benzoin and steristrips were applied.  Sponge, lap and needle counts were correct times two. The patient was taken to the recovery area awake, extubated and in stable condition.

## 2013-06-01 NOTE — Anesthesia Postprocedure Evaluation (Signed)
Anesthesia Post Note  Patient: Sherri Shaw  Procedure(s) Performed: Procedure(s) (LRB): HYSTERECTOMY ABDOMINAL (N/A) BILATERAL SALPINGECTOMY (Bilateral) CYSTOSCOPY WITH BILATERAL STENT REPLACEMENT (Bilateral)  Anesthesia type: General  Patient location: PACU  Post pain: Pain level controlled  Post assessment: Post-op Vital signs reviewed  Last Vitals:  Filed Vitals:   06/01/13 1115  BP: 91/62  Pulse: 67  Temp:   Resp: 17    Post vital signs: Reviewed  Level of consciousness: sedated  Complications: No apparent anesthesia complications chest xray negative for significant pathology.

## 2013-06-01 NOTE — Progress Notes (Signed)
Dr Erin Fulling called re:  Hypotension(continuing to trend downward), difficulty getting CBC with several attempts in PACU, possible ICU admission as suggested by Dr Hatchett(anesthesiologist).  Stated she would like to hematocrit result before making a decision.  Will call back when labs have been resulted.

## 2013-06-01 NOTE — Progress Notes (Signed)
Notified Dr Erin Fulling of CBC results.  Does not want ICU bed at this time.  To put orders in regarding BP parameters for floor.

## 2013-06-02 ENCOUNTER — Encounter (HOSPITAL_COMMUNITY): Payer: Self-pay | Admitting: Obstetrics & Gynecology

## 2013-06-02 LAB — TYPE AND SCREEN
ABO/RH(D): A POS
Antibody Screen: NEGATIVE
Unit division: 0

## 2013-06-02 LAB — CBC
Hemoglobin: 9.3 g/dL — ABNORMAL LOW (ref 12.0–15.0)
MCH: 28.2 pg (ref 26.0–34.0)
MCHC: 34.7 g/dL (ref 30.0–36.0)
MCV: 82.1 fL (ref 78.0–100.0)
MCV: 82.2 fL (ref 78.0–100.0)
Platelets: 167 10*3/uL (ref 150–400)
Platelets: 158 10*3/uL (ref 150–400)
RBC: 3.26 MIL/uL — ABNORMAL LOW (ref 3.87–5.11)
RDW: 16.7 % — ABNORMAL HIGH (ref 11.5–15.5)
RDW: 16.8 % — ABNORMAL HIGH (ref 11.5–15.5)
WBC: 9 10*3/uL (ref 4.0–10.5)
WBC: 8.1 10*3/uL (ref 4.0–10.5)

## 2013-06-02 MED ORDER — CLOTRIMAZOLE-BETAMETHASONE 1-0.05 % EX CREA: TOPICAL_CREAM | Freq: Two times a day (BID) | CUTANEOUS | Status: DC

## 2013-06-02 MED ORDER — CLOTRIMAZOLE-BETAMETHASONE 1-0.05 % EX CREA
TOPICAL_CREAM | Freq: Two times a day (BID) | CUTANEOUS | Status: DC
  Administered 2013-06-02 – 2013-06-03 (×3): via TOPICAL
  Filled 2013-06-02: qty 15

## 2013-06-02 MED ORDER — CLOTRIMAZOLE 1 % EX CREA
TOPICAL_CREAM | Freq: Two times a day (BID) | CUTANEOUS | Status: DC
  Filled 2013-06-02: qty 15

## 2013-06-02 MED ORDER — PANTOPRAZOLE SODIUM 40 MG PO TBEC
40.0000 mg | DELAYED_RELEASE_TABLET | Freq: Every day | ORAL | Status: DC
  Administered 2013-06-03: 40 mg via ORAL
  Filled 2013-06-02 (×3): qty 1

## 2013-06-02 MED ORDER — TRIAMCINOLONE ACETONIDE 0.1 % EX CREA
TOPICAL_CREAM | Freq: Two times a day (BID) | CUTANEOUS | Status: DC
  Filled 2013-06-02: qty 15

## 2013-06-02 NOTE — Progress Notes (Signed)
1 Day Post-Op Procedure(s) (LRB): HYSTERECTOMY ABDOMINAL (N/A) BILATERAL SALPINGECTOMY (Bilateral) CYSTOSCOPY WITH BILATERAL STENT REPLACEMENT (Bilateral)  Subjective: Patient reports no problems with pain.  She has not pushed her PCA at all.  She denies pain.  She just completed breakfast.  She had clears overnight with no nausea or emesis.  She is not vodied since foley was recently removed or passed flatus.   Objective: I have reviewed patient's vital signs, intake and output, medications and labs.  General: alert and no distress GI: soft, non-tender; bowel sounds normal; no masses,  no organomegaly and incision: dressing clean and dry Extremities: extremities normal, atraumatic, no cyanosis or edema  Assessment: s/p Procedure(s): HYSTERECTOMY ABDOMINAL (N/A) BILATERAL SALPINGECTOMY (Bilateral) CYSTOSCOPY WITH BILATERAL STENT REPLACEMENT (Bilateral): stable Normal post op course.  Hct appears to be stabilizing will follow  Plan: Advance diet Encourage ambulation Advance to PO medication Discontinue IV fluids  LOS: 1 day    HARRAWAY-SMITH, Gabryel Files 06/02/2013, 10:22 AM

## 2013-06-02 NOTE — Progress Notes (Signed)
UR completed 

## 2013-06-03 LAB — CBC
HCT: 26.2 % — ABNORMAL LOW (ref 36.0–46.0)
Hemoglobin: 8.9 g/dL — ABNORMAL LOW (ref 12.0–15.0)
MCHC: 34 g/dL (ref 30.0–36.0)
Platelets: 178 10*3/uL (ref 150–400)
RBC: 3.16 MIL/uL — ABNORMAL LOW (ref 3.87–5.11)
WBC: 7.2 10*3/uL (ref 4.0–10.5)

## 2013-06-03 MED ORDER — IBUPROFEN 600 MG PO TABS: 600.0000 mg | ORAL_TABLET | Freq: Four times a day (QID) | ORAL | Status: DC

## 2013-06-03 MED ORDER — POLYETHYLENE GLYCOL 3350 17 G PO PACK
34.0000 g | PACK | Freq: Once | ORAL | Status: DC
  Filled 2013-06-03: qty 2

## 2013-06-03 MED ORDER — OXYCODONE-ACETAMINOPHEN 5-325 MG PO TABS: 1.0000 | ORAL_TABLET | ORAL | Status: DC | PRN

## 2013-06-03 MED ORDER — BISACODYL 10 MG RE SUPP
10.0000 mg | Freq: Once | RECTAL | Status: AC
  Administered 2013-06-03: 10 mg via RECTAL
  Filled 2013-06-03: qty 1

## 2013-06-03 MED ORDER — CLOTRIMAZOLE-BETAMETHASONE 1-0.05 % EX CREA: TOPICAL_CREAM | Freq: Two times a day (BID) | CUTANEOUS | Status: DC

## 2013-06-03 NOTE — Discharge Summary (Signed)
Physician Discharge Summary  Patient ID: Sherri Shaw MRN: [id number] DOB/AGE: 34-06-1981 34 y.o.  Admit date: 06/01/2013 Discharge date: 06/03/2013  Admission Diagnoses: uterine fibroids; menorrhagia  Discharge Diagnoses: same   Discharged Condition: good  Hospital Course: Pt was admitted for TAH with bilateral salpingectomy.  She under went surgery without complications.  She had a PCA post op but, did not use it often.  It was stopped on post op day #1.  She had a foley cath that was removed on POD #1 and she has been voiding without difficulty.  She was tolerating a regular diet and has had no N/V but, has not passed flatus.  She has not ambulated today as she thought she needed to wait for assistance.  She is waiting to take a bath and then will get a Doculox supp. Pt reports that pain is not severe enough to take meds.  Consults: None  Significant Diagnostic Studies: labs: CBC  Treatments: IV hydration, analgesia: acetaminophen w/ codeine and Morphine, surgery: Total abd hyst with bilateral salpingectomy and blood transfusion 4 units PRBC's  Discharge Exam: Blood pressure 110/77, pulse 89, temperature 98.3 F (36.8 C), temperature source Oral, resp. rate 16, height 5\' 2"  (1.575 m), weight 136 lb (61.689 kg), last menstrual period 05/25/2013, SpO2 100.00%. General appearance: alert and no distress Back: negative, symmetric, no curvature. ROM normal. No CVA tenderness. Resp: clear to auscultation bilaterally Extremities: extremities normal, atraumatic, no cyanosis or edema Incision/Wound: outer dressing removed  CBC    Component Value Date/Time   WBC 7.2 06/03/2013 0530   RBC 3.16* 06/03/2013 0530   RBC 2.86* 04/07/2013 0005   HGB 8.9* 06/03/2013 0530   HCT 26.2* 06/03/2013 0530   PLT 178 06/03/2013 0530   MCV 82.9 06/03/2013 0530   MCH 28.2 06/03/2013 0530   MCHC 34.0 06/03/2013 0530   RDW 16.9* 06/03/2013 0530   LYMPHSABS 1.6 04/06/2013 2322   MONOABS 0.5 04/06/2013 2322    EOSABS 0.4 04/06/2013 2322   BASOSABS 0.1 04/06/2013 2322     06/01/2013 Diagnosis Uterus and cervix, with bilateral fallopian tubes and fibroids x5 - BENIGN UTERINE LEIOMYOMATA (UP TO 14 CM). - BENIGN ENDOMETRIAL GLANDS AND STROMA; NEGATIVE FOR HYPERPLASIA OR MALIGNANCY. - BENIGN CERVICAL MUCOSA; NEGATIVE FOR INTRAEPITHELIAL LESION OR MALIGNANCY. - BENIGN SEROSAL ADHESIONS WITH ENDOMETRIOTIC IMPLANT; NEGATIVE FOR ATYPIA OR MALIGNANCY. - BENIGN RIGHT AND LEFT FALLOPIAN TUBE; NEGATIVE FOR ATYPIA OR MALIGNANCY.  Disposition: 01-Home or Self Care  Discharge Orders   Future Orders Complete By Expires   Call MD for:  difficulty breathing, headache or visual disturbances  As directed    Call MD for:  persistant nausea and vomiting  As directed    Call MD for:  redness, tenderness, or signs of infection (pain, swelling, redness, odor or green/yellow discharge around incision site)  As directed    Call MD for:  severe uncontrolled pain  As directed    Call MD for:  temperature >100.4  As directed    Diet general  As directed    Discharge wound care:  As directed    Comments:     Steri-strips may stay on. Please keep incision clean and dry   Increase activity slowly  As directed    Lifting restrictions  As directed    Comments:     No heavy lifting   Sexual Activity Restrictions  As directed    Comments:     Nothing in vagina       Medication List  STOP taking these medications       megestrol 40 MG tablet  Commonly known as:  MEGACE      TAKE these medications       albuterol 108 (90 BASE) MCG/ACT inhaler  Commonly known as:  PROVENTIL HFA;VENTOLIN HFA  Inhale 2 puffs into the lungs every 6 (six) hours as needed for wheezing.     clotrimazole-betamethasone cream  Commonly known as:  LOTRISONE  Apply topically 2 (two) times daily. Apply small amount under breasts     ferrous sulfate 325 (65 FE) MG tablet  Commonly known as:  FERROUSUL  Take 1 tablet (325 mg total) by  mouth 2 (two) times daily.     ibuprofen 600 MG tablet  Commonly known as:  ADVIL,MOTRIN  Take 1 tablet (600 mg total) by mouth every 6 (six) hours.     lurasidone 40 MG Tabs tablet  Commonly known as:  LATUDA  Take 40 mg by mouth daily with breakfast.     oxyCODONE-acetaminophen 5-325 MG per tablet  Commonly known as:  PERCOCET/ROXICET  Take 1-2 tablets by mouth every 4 (four) hours as needed.           Follow-up Information   Follow up with Willodean Rosenthal, MD In 2 weeks.   Specialty:  Obstetrics and Gynecology   Contact information:   [address] [place] [address] [phone]       Signed: Willodean Rosenthal 06/03/2013, 12:21 PM

## 2013-06-03 NOTE — Progress Notes (Signed)
Pt is discharged in the care of Sister  Attended by Nurse Tech. Denies any pain or discomfort. Spirits are good Abdominal dressing was  Removed (. Honeycomb). Steri strips are intact.Denies vaginal bleeding.Stable.t

## 2013-06-05 ENCOUNTER — Encounter: Payer: Self-pay | Admitting: *Deleted

## 2013-06-17 ENCOUNTER — Ambulatory Visit (INDEPENDENT_AMBULATORY_CARE_PROVIDER_SITE_OTHER): Payer: Self-pay | Admitting: Obstetrics & Gynecology

## 2013-06-17 ENCOUNTER — Encounter: Payer: Self-pay | Admitting: Obstetrics & Gynecology

## 2013-06-17 VITALS — BP 93/69 | HR 84 | Ht 62.0 in | Wt 122.7 lb

## 2013-06-17 DIAGNOSIS — Z09 Encounter for follow-up examination after completed treatment for conditions other than malignant neoplasm: Secondary | ICD-10-CM

## 2013-06-17 NOTE — Patient Instructions (Signed)
Hysterectomy °Care After °These instructions give you information on caring for yourself after your procedure. Your doctor may also give you more specific instructions. Call your doctor if you have any problems or questions after your procedure. °HOME CARE °Healing takes time. You may have discomfort, tenderness, puffiness (swelling), and bruising at the wound site. This may last for 2 weeks. This is normal and will get better. °· Only take medicine as told by your doctor. °· Do not take aspirin. °· Do not drive when taking pain medicine. °· Exercise, lift objects, drive, and get back to daily activites as told by your doctor. °· Get back to your normal diet and activities as told by your doctor. °· Get plenty of rest and sleep. °· Do not douche, use tampons, or have sex (intercourse) for at least 6 weeks or as told. °· Change your bandages (dressings) as told by your doctor. °· Take your temperature during the day. °· Take showers for 2 to 3 weeks. Do not take baths. °· Do not drink alcohol until your doctor says it is okay. °· Take a medicine to help you poop (laxative) as told by your doctor. Try eating bran foods. Drink enough fluids to keep your pee (urine) clear or pale yellow. °· Have someone help you at home for 1 to 2 weeks after your surgery. °· Keep follow-up doctor visits as told. °GET HELP RIGHT AWAY IF:  °· You have a fever. °· You have bad belly (abdominal) pain. °· You have chest pain. °· You are short of breath. °· You pass out (faint). °· You have pain, puffiness, or redness of your leg. °· You bleed a lot from your vagina and notice clumps of tissue (clots). °· You have puffiness, redness, or pain where a tube was put in your vein (IV) or in the wound area. °· You have yellowish-white fluid (pus) coming from the wound. °· You have a bad smell coming from the wound or bandage. °· Your wound pulls apart. °· You feel dizzy or lightheaded. °· You have pain or bleeding when you pee. °· You keep having  watery poop (diarrhea). °· You keep feeling sick to your stomach (nauseous) or keep throwing up (vomiting). °· You have fluid (discharge) coming from your vagina. °· You have a rash. °· You have a reaction to your medicine. °· You need stronger pain medicine. °MAKE SURE YOU: °· Understand these instructions. °· Will watch your condition. °· Will get help right away if you are not doing well or get worse. °Document Released: 05/01/2008 Document Revised: 10/15/2011 Document Reviewed: 03/09/2011 °[employer]® Patient Information ©2014 [employer]. ° °

## 2013-06-17 NOTE — Progress Notes (Signed)
Subjective:     Patient ID: Sherri Shaw, female   DOB: 10/30/1980, 32 y.o.   MRN: [id number]  HPI Pt presents for 2 week post op check.  She denies problems.  She reports that she is rarely taking pain meds.  She is taking once a day.  She denies problems with voiding or passing stools.  She is tol a regular diet.  She wants to return to school.  Review of Systems     Objective:   Physical Exam Pt in NAD BP 93/69  Pulse 84  Ht 5\' 2"  (1.575 m)  Wt 122 lb 11.2 oz (55.656 kg)  BMI 22.44 kg/m2  LMP 05/13/2013 Abd: soft, ND, NT. Steri-strips removed. Incision clean, dry and intact  surg path 06/01/2013 Diagnosis Uterus and cervix, with bilateral fallopian tubes and fibroids x5 - BENIGN UTERINE LEIOMYOMATA (UP TO 14 CM). - BENIGN ENDOMETRIAL GLANDS AND STROMA; NEGATIVE FOR HYPERPLASIA OR MALIGNANCY. - BENIGN CERVICAL MUCOSA; NEGATIVE FOR INTRAEPITHELIAL LESION OR MALIGNANCY. - BENIGN SEROSAL ADHESIONS WITH ENDOMETRIOTIC IMPLANT; NEGATIVE FOR ATYPIA OR MALIGNANCY. - BENIGN RIGHT AND LEFT FALLOPIAN TUBE; NEGATIVE FOR ATYPIA OR MALIGNANCY. pecimen Gross and Clinical Information Specimen(s) Obtained: Uterus and cervix, with bilateral fallopian tubes and fibroids x5    Assessment:     2 week post op check - doing well     Plan:     Gradually increase activity.  May return to school F/u 4 weeks or sooner prn

## 2013-06-19 ENCOUNTER — Encounter: Payer: Self-pay | Admitting: *Deleted

## 2014-02-01 ENCOUNTER — Emergency Department (HOSPITAL_COMMUNITY): Payer: Self-pay

## 2014-02-01 ENCOUNTER — Emergency Department (HOSPITAL_COMMUNITY)
Admission: EM | Admit: 2014-02-01 | Discharge: 2014-02-01 | Disposition: A | Discharge status: Home / Self Care | Payer: Self-pay | Attending: Emergency Medicine | Admitting: Emergency Medicine

## 2014-02-01 ENCOUNTER — Encounter (HOSPITAL_COMMUNITY): Payer: Self-pay | Admitting: Emergency Medicine

## 2014-02-01 DIAGNOSIS — Z79899 Other long term (current) drug therapy: Secondary | ICD-10-CM | POA: Insufficient documentation

## 2014-02-01 DIAGNOSIS — R21 Rash and other nonspecific skin eruption: Secondary | ICD-10-CM | POA: Insufficient documentation

## 2014-02-01 DIAGNOSIS — J40 Bronchitis, not specified as acute or chronic: Secondary | ICD-10-CM

## 2014-02-01 DIAGNOSIS — I252 Old myocardial infarction: Secondary | ICD-10-CM | POA: Insufficient documentation

## 2014-02-01 DIAGNOSIS — Z862 Personal history of diseases of the blood and blood-forming organs and certain disorders involving the immune mechanism: Secondary | ICD-10-CM | POA: Insufficient documentation

## 2014-02-01 DIAGNOSIS — Z8659 Personal history of other mental and behavioral disorders: Secondary | ICD-10-CM | POA: Insufficient documentation

## 2014-02-01 DIAGNOSIS — J4530 Mild persistent asthma, uncomplicated: Secondary | ICD-10-CM

## 2014-02-01 DIAGNOSIS — J45901 Unspecified asthma with (acute) exacerbation: Secondary | ICD-10-CM | POA: Insufficient documentation

## 2014-02-01 DIAGNOSIS — L259 Unspecified contact dermatitis, unspecified cause: Secondary | ICD-10-CM

## 2014-02-01 LAB — CBC WITH DIFFERENTIAL/PLATELET
BASOS PCT: 0 % (ref 0–1)
Basophils Absolute: 0 10*3/uL (ref 0.0–0.1)
Eosinophils Absolute: 1 10*3/uL — ABNORMAL HIGH (ref 0.0–0.7)
Eosinophils Relative: 13 % — ABNORMAL HIGH (ref 0–5)
HCT: 33.2 % — ABNORMAL LOW (ref 36.0–46.0)
Hemoglobin: 10.6 g/dL — ABNORMAL LOW (ref 12.0–15.0)
Lymphocytes Relative: 25 % (ref 12–46)
Lymphs Abs: 2 10*3/uL (ref 0.7–4.0)
MCH: 28.3 pg (ref 26.0–34.0)
MCHC: 31.9 g/dL (ref 30.0–36.0)
MCV: 88.5 fL (ref 78.0–100.0)
MONOS PCT: 6 % (ref 3–12)
Monocytes Absolute: 0.5 10*3/uL (ref 0.1–1.0)
NEUTROS PCT: 56 % (ref 43–77)
Neutro Abs: 4.6 10*3/uL (ref 1.7–7.7)
PLATELETS: 267 10*3/uL (ref 150–400)
RBC: 3.75 MIL/uL — ABNORMAL LOW (ref 3.87–5.11)
RDW: 14.1 % (ref 11.5–15.5)
WBC: 8.1 10*3/uL (ref 4.0–10.5)

## 2014-02-01 LAB — I-STAT CHEM 8, ED
BUN: 6 mg/dL (ref 6–23)
Calcium, Ion: 1.18 mmol/L (ref 1.12–1.23)
Chloride: 101 mEq/L (ref 96–112)
Creatinine, Ser: 0.6 mg/dL (ref 0.50–1.10)
GLUCOSE: 91 mg/dL (ref 70–99)
HEMATOCRIT: 38 % (ref 36.0–46.0)
HEMOGLOBIN: 12.9 g/dL (ref 12.0–15.0)
Potassium: 3.5 mEq/L — ABNORMAL LOW (ref 3.7–5.3)
SODIUM: 140 meq/L (ref 137–147)
TCO2: 25 mmol/L (ref 0–100)

## 2014-02-01 MED ORDER — CLOTRIMAZOLE-BETAMETHASONE 1-0.05 % EX CREA: TOPICAL_CREAM | CUTANEOUS | Status: DC

## 2014-02-01 MED ORDER — PREDNISONE 20 MG PO TABS: ORAL_TABLET | ORAL | Status: DC

## 2014-02-01 MED ORDER — IPRATROPIUM-ALBUTEROL 0.5-2.5 (3) MG/3ML IN SOLN
3.0000 mL | Freq: Once | RESPIRATORY_TRACT | Status: AC
  Administered 2014-02-01: 3 mL via RESPIRATORY_TRACT
  Filled 2014-02-01: qty 3

## 2014-02-01 MED ORDER — ALBUTEROL SULFATE HFA 108 (90 BASE) MCG/ACT IN AERS: 2.0000 | INHALATION_SPRAY | Freq: Four times a day (QID) | RESPIRATORY_TRACT | Status: DC | PRN

## 2014-02-01 MED ORDER — ALBUTEROL SULFATE (2.5 MG/3ML) 0.083% IN NEBU
2.5000 mg | INHALATION_SOLUTION | Freq: Once | RESPIRATORY_TRACT | Status: AC
  Administered 2014-02-01: 2.5 mg via RESPIRATORY_TRACT
  Filled 2014-02-01: qty 3

## 2014-02-01 MED ORDER — AMOXICILLIN 500 MG PO CAPS: 500.0000 mg | ORAL_CAPSULE | Freq: Three times a day (TID) | ORAL | Status: DC

## 2014-02-01 NOTE — ED Notes (Signed)
Patient with rash on right side of neck.  Patient states that she has also had a cough about as long as the rash.  She states she has been wheezing with the cough.  States she has been using her HFA with no relief.

## 2014-02-01 NOTE — ED Notes (Signed)
Pt to come to room after x-ray.

## 2014-02-01 NOTE — Discharge Instructions (Signed)
Asthma Asthma is a recurring condition in which the airways tighten and narrow. Asthma can make it difficult to breathe. It can cause coughing, wheezing, and shortness of breath. Asthma episodes, also called asthma attacks, range from minor to life-threatening. Asthma cannot be cured, but medicines and lifestyle changes can help control it. CAUSES Asthma is believed to be caused by inherited (genetic) and environmental factors, but its exact cause is unknown. Asthma may be triggered by allergens, lung infections, or irritants in the air. Asthma triggers are different for each person. Common triggers include:   Animal dander.  Dust mites.  Cockroaches.  Pollen from trees or grass.  Mold.  Smoke.  Air pollutants such as dust, household cleaners, hair sprays, aerosol sprays, paint fumes, strong chemicals, or strong odors.  Cold air, weather changes, and winds (which increase molds and pollens in the air).  Strong emotional expressions such as crying or laughing hard.  Stress.  Certain medicines (such as aspirin) or types of drugs (such as beta-blockers).  Sulfites in foods and drinks. Foods and drinks that may contain sulfites include dried fruit, potato chips, and sparkling grape juice.  Infections or inflammatory conditions such as the flu, a cold, or an inflammation of the nasal membranes (rhinitis).  Gastroesophageal reflux disease (GERD).  Exercise or strenuous activity. SYMPTOMS Symptoms may occur immediately after asthma is triggered or many hours later. Symptoms include:  Wheezing.  Excessive nighttime or early morning coughing.  Frequent or severe coughing with a common cold.  Chest tightness.  Shortness of breath. DIAGNOSIS  The diagnosis of asthma is made by a review of your medical history and a physical exam. Tests may also be performed. These may include:  Lung function studies. These tests show how much air you breathe in and out.  Allergy  tests.  Imaging tests such as X-rays. TREATMENT  Asthma cannot be cured, but it can usually be controlled. Treatment involves identifying and avoiding your asthma triggers. It also involves medicines. There are 2 classes of medicine used for asthma treatment:   Controller medicines. These prevent asthma symptoms from occurring. They are usually taken every day.  Reliever or rescue medicines. These quickly relieve asthma symptoms. They are used as needed and provide short-term relief. Your health care provider will help you create an asthma action plan. An asthma action plan is a written plan for managing and treating your asthma attacks. It includes a list of your asthma triggers and how they may be avoided. It also includes information on when medicines should be taken and when their dosage should be changed. An action plan may also involve the use of a device called a peak flow meter. A peak flow meter measures how well the lungs are working. It helps you monitor your condition. HOME CARE INSTRUCTIONS   Take medicine as directed by your health care provider. Speak with your health care provider if you have questions about how or when to take the medicines.  Use a peak flow meter as directed by your health care provider. Record and keep track of readings.  Understand and use the action plan to help minimize or stop an asthma attack without needing to seek medical care.  Control your home environment in the following ways to help prevent asthma attacks:  Do not smoke. Avoid being exposed to secondhand smoke.  Change your heating and air conditioning filter regularly.  Limit your use of fireplaces and wood stoves.  Get rid of pests (such as roaches and mice)  and their droppings.  Throw away plants if you see mold on them.  Clean your floors and dust regularly. Use unscented cleaning products.  Try to have someone else vacuum for you regularly. Stay out of rooms while they are being  vacuumed and for a short while afterward. If you vacuum, use a dust mask from a hardware store, a double-layered or microfilter vacuum cleaner bag, or a vacuum cleaner with a HEPA filter.  Replace carpet with wood, tile, or vinyl flooring. Carpet can trap dander and dust.  Use allergy-proof pillows, mattress covers, and box spring covers.  Wash bed sheets and blankets every week in hot water and dry them in a dryer.  Use blankets that are made of polyester or cotton.  Clean bathrooms and kitchens with bleach. If possible, have someone repaint the walls in these rooms with mold-resistant paint. Keep out of the rooms that are being cleaned and painted.  Wash hands frequently. SEEK MEDICAL CARE IF:   You have wheezing, shortness of breath, or a cough even if taking medicine to prevent attacks.  The colored mucus you cough up (sputum) is thicker than usual.  Your sputum changes from clear or white to yellow, green, gray, or bloody.  You have any problems that may be related to the medicines you are taking (such as a rash, itching, swelling, or trouble breathing).  You are using a reliever medicine more than 2-3 times per week.  Your peak flow is still at 50-79% of your personal best after following your action plan for 1 hour. SEEK IMMEDIATE MEDICAL CARE IF:   You seem to be getting worse and are unresponsive to treatment during an asthma attack.  You are short of breath even at rest.  You get short of breath when doing very little physical activity.  You have difficulty eating, drinking, or talking due to asthma symptoms.  You develop chest pain.  You develop a fast heartbeat.  You have a bluish color to your lips or fingernails.  You are lightheaded, dizzy, or faint.  Your peak flow is less than 50% of your personal best.  You have a fever or persistent symptoms for more than 2-3 days.  You have a fever and symptoms suddenly get worse. MAKE SURE YOU:   Understand  these instructions.  Will watch your condition.  Will get help right away if you are not doing well or get worse. Document Released: 07/23/2005 Document Revised: 07/28/2013 Document Reviewed: 02/19/2013 [facility] Patient Information 2015 [place], [place]. This information is not intended to replace advice given to you by your health care provider. Make sure you discuss any questions you have with your health care provider.  Bronchitis Bronchitis is inflammation of the airways that extend from the windpipe into the lungs (bronchi). The inflammation often causes mucus to develop, which leads to a cough. If the inflammation becomes severe, it may cause shortness of breath. CAUSES  Bronchitis may be caused by:   Viral infections.   Bacteria.   Cigarette smoke.   Allergens, pollutants, and other irritants.  SIGNS AND SYMPTOMS  The most common symptom of bronchitis is a frequent cough that produces mucus. Other symptoms include:  Fever.   Body aches.   Chest congestion.   Chills.   Shortness of breath.   Sore throat.  DIAGNOSIS  Bronchitis is usually diagnosed through a medical history and physical exam. Tests, such as chest X-rays, are sometimes done to rule out other conditions.  TREATMENT  You may  need to avoid contact with whatever caused the problem (smoking, for example). Medicines are sometimes needed. These may include:  Antibiotics. These may be prescribed if the condition is caused by bacteria.  Cough suppressants. These may be prescribed for relief of cough symptoms.   Inhaled medicines. These may be prescribed to help open your airways and make it easier for you to breathe.   Steroid medicines. These may be prescribed for those with recurrent (chronic) bronchitis. HOME CARE INSTRUCTIONS  Get plenty of rest.   Drink enough fluids to keep your urine clear or pale yellow (unless you have a medical condition that requires fluid restriction). Increasing  fluids may help thin your secretions and will prevent dehydration.   Only take over-the-counter or prescription medicines as directed by your health care provider.  Only take antibiotics as directed. Make sure you finish them even if you start to feel better.  Avoid secondhand smoke, irritating chemicals, and strong fumes. These will make bronchitis worse. If you are a smoker, quit smoking. Consider using nicotine gum or skin patches to help control withdrawal symptoms. Quitting smoking will help your lungs heal faster.   Put a cool-mist humidifier in your bedroom at night to moisten the air. This may help loosen mucus. Change the water in the humidifier daily. You can also run the hot water in your shower and sit in the bathroom with the door closed for 5-10 minutes.   Follow up with your health care provider as directed.   Wash your hands frequently to avoid catching bronchitis again or spreading an infection to others.  SEEK MEDICAL CARE IF: Your symptoms do not improve after 1 week of treatment.  SEEK IMMEDIATE MEDICAL CARE IF:  Your fever increases.  You have chills.   You have chest pain.   You have worsening shortness of breath.   You have bloody sputum.  You faint.  You have lightheadedness.  You have a severe headache.   You vomit repeatedly. MAKE SURE YOU:   Understand these instructions.  Will watch your condition.  Will get help right away if you are not doing well or get worse. Document Released: 07/23/2005 Document Revised: 05/13/2013 Document Reviewed: 03/17/2013 [facility] Patient Information 2015 [place], [place]. This information is not intended to replace advice given to you by your health care provider. Make sure you discuss any questions you have with your health care provider.  Contact Dermatitis Contact dermatitis is a reaction to certain substances that touch the skin. Contact dermatitis can be either irritant contact dermatitis or allergic  contact dermatitis. Irritant contact dermatitis does not require previous exposure to the substance for a reaction to occur.Allergic contact dermatitis only occurs if you have been exposed to the substance before. Upon a repeat exposure, your body reacts to the substance.  CAUSES  Many substances can cause contact dermatitis. Irritant dermatitis is most commonly caused by repeated exposure to mildly irritating substances, such as:  Makeup.  Soaps.  Detergents.  Bleaches.  Acids.  Metal salts, such as nickel. Allergic contact dermatitis is most commonly caused by exposure to:  Poisonous plants.  Chemicals (deodorants, shampoos).  Jewelry.  Latex.  Neomycin in triple antibiotic cream.  Preservatives in products, including clothing. SYMPTOMS  The area of skin that is exposed may develop:  Dryness or flaking.  Redness.  Cracks.  Itching.  Pain or a burning sensation.  Blisters. With allergic contact dermatitis, there may also be swelling in areas such as the eyelids, mouth, or genitals.  DIAGNOSIS  Your caregiver can usually tell what the problem is by doing a physical exam. In cases where the cause is uncertain and an allergic contact dermatitis is suspected, a patch skin test may be performed to help determine the cause of your dermatitis. TREATMENT Treatment includes protecting the skin from further contact with the irritating substance by avoiding that substance if possible. Barrier creams, powders, and gloves may be helpful. Your caregiver may also recommend:  Steroid creams or ointments applied 2 times daily. For best results, soak the rash area in cool water for 20 minutes. Then apply the medicine. Cover the area with a plastic wrap. You can store the steroid cream in the refrigerator for a "chilly" effect on your rash. That may decrease itching. Oral steroid medicines may be needed in more severe cases.  Antibiotics or antibacterial ointments if a skin infection  is present.  Antihistamine lotion or an antihistamine taken by mouth to ease itching.  Lubricants to keep moisture in your skin.  Burow's solution to reduce redness and soreness or to dry a weeping rash. Mix one packet or tablet of solution in 2 cups cool water. Dip a clean washcloth in the mixture, wring it out a bit, and put it on the affected area. Leave the cloth in place for 30 minutes. Do this as often as possible throughout the day.  Taking several cornstarch or baking soda baths daily if the area is too large to cover with a washcloth. Harsh chemicals, such as alkalis or acids, can cause skin damage that is like a burn. You should flush your skin for 15 to 20 minutes with cold water after such an exposure. You should also seek immediate medical care after exposure. Bandages (dressings), antibiotics, and pain medicine may be needed for severely irritated skin.  HOME CARE INSTRUCTIONS  Avoid the substance that caused your reaction.  Keep the area of skin that is affected away from hot water, soap, sunlight, chemicals, acidic substances, or anything else that would irritate your skin.  Do not scratch the rash. Scratching may cause the rash to become infected.  You may take cool baths to help stop the itching.  Only take over-the-counter or prescription medicines as directed by your caregiver.  See your caregiver for follow-up care as directed to make sure your skin is healing properly. SEEK MEDICAL CARE IF:   Your condition is not better after 3 days of treatment.  You seem to be getting worse.  You see signs of infection such as swelling, tenderness, redness, soreness, or warmth in the affected area.  You have any problems related to your medicines. Document Released: 07/20/2000 Document Revised: 10/15/2011 Document Reviewed: 12/26/2010 [facility] Patient Information 2015 [place], [place]. This information is not intended to replace advice given to you by your health care  provider. Make sure you discuss any questions you have with your health care provider.

## 2014-02-01 NOTE — ED Notes (Signed)
Pt ambulates without distress pt alert x4.

## 2014-02-01 NOTE — ED Provider Notes (Addendum)
CSN: [id number]     Arrival date & time 02/01/14  2023 History   First MD Initiated Contact with Patient 02/01/14 2057     Chief Complaint  Patient presents with  . Cough  . Rash     (Consider location/radiation/quality/duration/timing/severity/associated sxs/prior Treatment) HPI Comments: Patient presents to the ER for evaluation of 2 separate complaints. Patient reports that she has had a rash on the right side of her neck for a couple of weeks. Patient reports that the area is itchy. It does not seem to be spreading. There is no drainage. She has never had a similar rash is not anywhere else on her body.  Patient also complaining of cough and shortness of breath. She has a history of asthma and recurrent bronchitis. She has been using her albuterol inhaler but it is not helping. Cough is nonproductive. She has not had any noted fevers.  Patient is a 33 y.o. female presenting with cough and rash.  Cough Associated symptoms: rash and wheezing   Rash Associated symptoms: wheezing     Past Medical History  Diagnosis Date  . Asthma   . Chronic bronchitis     "I get it q yr" (04/07/2013)  . Anemia   . History of blood transfusion 04/07/2013    "got my first transfusion today; got 4 units" (04/07/2013)  . Bipolar disorder     sister denies this hx on 04/07/2013  . Schizophrenia     "don't really know the type"/sister (04/07/2013)  . Microcytic anemia     Archie Endo 04/07/2013  . Exertional shortness of breath     ? due to anemia   Past Surgical History  Procedure Laterality Date  . No past surgeries    . Abdominal hysterectomy N/A 06/01/2013    Procedure: HYSTERECTOMY ABDOMINAL;  Surgeon: Lavonia Drafts, MD;  Location: [facility];  Service: Gynecology;  Laterality: N/A;  . Bilateral salpingectomy Bilateral 06/01/2013    Procedure: BILATERAL SALPINGECTOMY;  Surgeon: Lavonia Drafts, MD;  Location: [facility];  Service: Gynecology;  Laterality: Bilateral;  . Cystoscopy w/ ureteral  stent placement Bilateral 06/01/2013    Procedure: CYSTOSCOPY WITH BILATERAL STENT REPLACEMENT;  Surgeon: Irine Seal, MD;  Location: [facility];  Service: Urology;  Laterality: Bilateral;   Family History  Problem Relation Age of Onset  . Hypertension Mother   . Diabetes Mother   . Asthma Mother   . Asthma Brother    History  Substance Use Topics  . Smoking status: Never Smoker   . Smokeless tobacco: Never Used  . Alcohol Use: No   OB History   Grav Para Term Preterm Abortions TAB SAB Ect Mult Living   0 0 0 0 0 0 0 0 0 0      Review of Systems  Respiratory: Positive for cough and wheezing.   Skin: Positive for rash.  All other systems reviewed and are negative.     Allergies  Review of patient's allergies indicates no known allergies.  Home Medications   Prior to Admission medications   Medication Sig Start Date End Date Taking? Authorizing Provider  albuterol (PROVENTIL HFA;VENTOLIN HFA) 108 (90 BASE) MCG/ACT inhaler Inhale 2 puffs into the lungs every 6 (six) hours as needed for wheezing.   Yes Historical Provider, MD  OVER THE COUNTER MEDICATION Take 5 mLs by mouth every 6 (six) hours. Over the counter cough and cold medicine. Took to relieve cough.   Yes Historical Provider, MD   BP 109/63  Pulse 75  Temp(Src)  98.4 F (36.9 C) (Oral)  Resp 18  Ht 5\' 2"  (1.575 m)  Wt 133 lb (60.328 kg)  BMI 24.32 kg/m2  SpO2 99%  LMP 05/13/2013 Physical Exam  Constitutional: She is oriented to person, place, and time. She appears well-developed and well-nourished. No distress.  HENT:  Head: Normocephalic and atraumatic.  Right Ear: Hearing normal.  Left Ear: Hearing normal.  Nose: Nose normal.  Mouth/Throat: Oropharynx is clear and moist and mucous membranes are normal.  Eyes: Conjunctivae and EOM are normal. Pupils are equal, round, and reactive to light.  Neck: Normal range of motion. Neck supple.  Cardiovascular: Regular rhythm, S1 normal and S2 normal.  Exam reveals no  gallop and no friction rub.   No murmur heard. Pulmonary/Chest: Effort normal. No respiratory distress. She has wheezes. She exhibits no tenderness.  Abdominal: Soft. Normal appearance and bowel sounds are normal. There is no hepatosplenomegaly. There is no tenderness. There is no rebound, no guarding, no tenderness at McBurney's point and negative Murphy's sign. No hernia.  Musculoskeletal: Normal range of motion.  Neurological: She is alert and oriented to person, place, and time. She has normal strength. No cranial nerve deficit or sensory deficit. Coordination normal. GCS eye subscore is 4. GCS verbal subscore is 5. GCS motor subscore is 6.  Skin: Skin is warm, dry and intact. No rash noted. No cyanosis.     Psychiatric: She has a normal mood and affect. Her speech is normal and behavior is normal. Thought content normal.    ED Course  Procedures (including critical care time) Labs Review Labs Reviewed  CBC WITH DIFFERENTIAL - Abnormal; Notable for the following:    RBC 3.75 (*)    Hemoglobin 10.6 (*)    HCT 33.2 (*)    Eosinophils Relative 13 (*)    Eosinophils Absolute 1.0 (*)    All other components within normal limits  I-STAT CHEM 8, ED - Abnormal; Notable for the following:    Potassium 3.5 (*)    All other components within normal limits    Imaging Review Dg Chest 2 View  02/01/2014   CLINICAL DATA:  Cough and rash  EXAM: CHEST  2 VIEW  COMPARISON:  06/01/2013  FINDINGS: Normal heart size and mediastinal contours. No acute infiltrate or edema. No effusion or pneumothorax. No acute osseous findings.  IMPRESSION: No active cardiopulmonary disease.   Electronically Signed   By: Jorje Guild M.D.   On: 02/01/2014 20:59     EKG Interpretation None      MDM   Final diagnoses:  None   asthma Bronchitis Contact dermatitis  Patient presents with cough and shortness of breath. She did have active wheezing on arrival. She does have history of asthma. Chest x-ray is  clear, no evidence of pneumonia. Patient administered albuterol and Atrovent here in the ER with improvement. We'll continue bronchodilator therapy and prednisone. She has been coughing for 2 weeks, will likely benefit with antibiotics added, will initiate amoxicillin.  There is a rash on the right side of her neck. Rash is unclear etiology, but does not appear to be bacterial in origin. It is most consistent morphologically with contact dermatitis, but could be fungal in origin. Will treat with Lotrisone.  Patient was noted to be hypotensive after arrival. Her blood pressure is running from 74-128 systolic. She has been asymptomatic with these lower blood pressures. I did review the chart and this appears to be where her blood pressure runs chronically, does not require  any further evaluation. She does report a history of anemia, but there is no significant anemia today compared to her baseline.   Orpah Greek, MD 02/01/14 [id number]  Orpah Greek, MD 02/01/14 [phone]

## 2014-02-01 NOTE — ED Notes (Signed)
Dr. Pollina at bedside   

## 2014-06-13 ENCOUNTER — Emergency Department (HOSPITAL_COMMUNITY)
Admission: EM | Admit: 2014-06-13 | Discharge: 2014-06-14 | Disposition: A | Discharge status: Home / Self Care | Payer: Self-pay | Attending: Emergency Medicine | Admitting: Emergency Medicine

## 2014-06-13 ENCOUNTER — Encounter (HOSPITAL_COMMUNITY): Payer: Self-pay | Admitting: Emergency Medicine

## 2014-06-13 ENCOUNTER — Emergency Department (HOSPITAL_COMMUNITY): Payer: Self-pay

## 2014-06-13 DIAGNOSIS — J069 Acute upper respiratory infection, unspecified: Secondary | ICD-10-CM

## 2014-06-13 DIAGNOSIS — J45901 Unspecified asthma with (acute) exacerbation: Secondary | ICD-10-CM

## 2014-06-13 DIAGNOSIS — Z79899 Other long term (current) drug therapy: Secondary | ICD-10-CM | POA: Insufficient documentation

## 2014-06-13 DIAGNOSIS — Z8659 Personal history of other mental and behavioral disorders: Secondary | ICD-10-CM | POA: Insufficient documentation

## 2014-06-13 DIAGNOSIS — Z792 Long term (current) use of antibiotics: Secondary | ICD-10-CM | POA: Insufficient documentation

## 2014-06-13 MED ORDER — DEXAMETHASONE SODIUM PHOSPHATE 10 MG/ML IJ SOLN
15.0000 mg | Freq: Once | INTRAMUSCULAR | Status: AC
Start: 2014-06-13 — End: 2014-06-13
  Administered 2014-06-13: 15 mg via INTRAVENOUS
  Filled 2014-06-13: qty 2

## 2014-06-13 MED ORDER — DEXAMETHASONE SODIUM PHOSPHATE 10 MG/ML IJ SOLN
10.0000 mg | Freq: Once | INTRAMUSCULAR | Status: DC
Start: 2014-06-13 — End: 2014-06-13

## 2014-06-13 MED ORDER — ALBUTEROL (5 MG/ML) CONTINUOUS INHALATION SOLN
10.0000 mg/h | INHALATION_SOLUTION | RESPIRATORY_TRACT | Status: AC
  Administered 2014-06-13: 10 mg/h via RESPIRATORY_TRACT
  Filled 2014-06-13: qty 20

## 2014-06-13 MED ORDER — IPRATROPIUM BROMIDE 0.02 % IN SOLN
0.5000 mg | Freq: Once | RESPIRATORY_TRACT | Status: AC
  Administered 2014-06-13: 0.5 mg via RESPIRATORY_TRACT
  Filled 2014-06-13: qty 2.5

## 2014-06-13 NOTE — ED Notes (Signed)
Attempted IV x2, unsuccessful. This RN is the second RN to attempt.

## 2014-06-13 NOTE — ED Notes (Signed)
Pt. reports progressing asthma with wheezing and productive cough onset last week unrelieved by rescue MDI , denies fever or chills.

## 2014-06-14 MED ORDER — PROMETHAZINE-DM 6.25-15 MG/5ML PO SYRP: 5.0000 mL | ORAL_SOLUTION | Freq: Four times a day (QID) | ORAL | Status: DC | PRN

## 2014-06-14 MED ORDER — GUAIFENESIN ER 1200 MG PO TB12: 1.0000 | ORAL_TABLET | Freq: Two times a day (BID) | ORAL | Status: DC

## 2014-06-14 MED ORDER — ALBUTEROL SULFATE HFA 108 (90 BASE) MCG/ACT IN AERS
2.0000 | INHALATION_SPRAY | RESPIRATORY_TRACT | Status: DC | PRN
  Administered 2014-06-14: 2 via RESPIRATORY_TRACT
  Filled 2014-06-14: qty 6.7

## 2014-06-14 MED ORDER — PREDNISONE 50 MG PO TABS: 50.0000 mg | ORAL_TABLET | Freq: Every day | ORAL | Status: DC

## 2014-06-14 NOTE — Discharge Instructions (Signed)
Your chest x-ray did not show any abnormalities.  Return here as needed.  Increase your fluid intake and rest as much as possible

## 2014-06-14 NOTE — ED Provider Notes (Signed)
CSN: [id number]     Arrival date & time 06/13/14  2120 History   First MD Initiated Contact with Patient 06/13/14 2125     Chief Complaint  Patient presents with  . Asthma     (Consider location/radiation/quality/duration/timing/severity/associated sxs/prior Treatment) HPI Patient presents to the emergency department with and asthma exacerbation with a productive cough over the last week.  The patient states that her inhalers do not seem to be helping her.  The patient states that when she coughs she has some chest discomfort.  The patient states that she does not have any sore throat, nausea, vomiting, fever, chills, runny nose, headache, blurred vision, neck pain, back pain, weakness, dizziness, lightheadedness, or syncope.  The patient states that she has not seen any other providers for her symptoms.  The patient sits up it seems like her condition, better Past Medical History  Diagnosis Date  . Asthma   . Chronic bronchitis     "I get it q yr" (04/07/2013)  . Anemia   . History of blood transfusion 04/07/2013    "got my first transfusion today; got 4 units" (04/07/2013)  . Bipolar disorder     sister denies this hx on 04/07/2013  . Schizophrenia     "don't really know the type"/sister (04/07/2013)  . Microcytic anemia     Archie Endo 04/07/2013  . Exertional shortness of breath     ? due to anemia   Past Surgical History  Procedure Laterality Date  . No past surgeries    . Abdominal hysterectomy N/A 06/01/2013    Procedure: HYSTERECTOMY ABDOMINAL;  Surgeon: Lavonia Drafts, MD;  Location: [facility];  Service: Gynecology;  Laterality: N/A;  . Bilateral salpingectomy Bilateral 06/01/2013    Procedure: BILATERAL SALPINGECTOMY;  Surgeon: Lavonia Drafts, MD;  Location: [facility];  Service: Gynecology;  Laterality: Bilateral;  . Cystoscopy w/ ureteral stent placement Bilateral 06/01/2013    Procedure: CYSTOSCOPY WITH BILATERAL STENT REPLACEMENT;  Surgeon: Irine Seal, MD;  Location: [facility];  Service: Urology;  Laterality: Bilateral;   Family History  Problem Relation Age of Onset  . Hypertension Mother   . Diabetes Mother   . Asthma Mother   . Asthma Brother    History  Substance Use Topics  . Smoking status: Never Smoker   . Smokeless tobacco: Never Used  . Alcohol Use: No   OB History    Gravida Para Term Preterm AB TAB SAB Ectopic Multiple Living   0 0 0 0 0 0 0 0 0 0      Review of Systems  All other systems negative except as documented in the HPI. All pertinent positives and negatives as reviewed in the HPI.  Allergies  Review of patient's allergies indicates no known allergies.  Home Medications   Prior to Admission medications   Medication Sig Start Date End Date Taking? Authorizing Provider  albuterol (PROVENTIL HFA;VENTOLIN HFA) 108 (90 BASE) MCG/ACT inhaler Inhale 2 puffs into the lungs every 6 (six) hours as needed for wheezing.   Yes Historical Provider, MD  albuterol (PROVENTIL HFA;VENTOLIN HFA) 108 (90 BASE) MCG/ACT inhaler Inhale 2 puffs into the lungs every 6 (six) hours as needed for wheezing or shortness of breath. 02/01/14   Orpah Greek, MD  amoxicillin (AMOXIL) 500 MG capsule Take 1 capsule (500 mg total) by mouth 3 (three) times daily. Patient not taking: Reported on 06/13/2014 02/01/14   Orpah Greek, MD  clotrimazole-betamethasone (LOTRISONE) cream Apply to affected area 2 times daily prn  Patient not taking: Reported on 06/13/2014 02/01/14   Orpah Greek, MD  OVER THE COUNTER MEDICATION Take 5 mLs by mouth every 6 (six) hours. Over the counter cough and cold medicine. Took to relieve cough.    Historical Provider, MD  predniSONE (DELTASONE) 20 MG tablet 3 tabs po daily x 3 days, then 2 tabs x 3 days, then 1.5 tabs x 3 days, then 1 tab x 3 days, then 0.5 tabs x 3 days 02/01/14   Orpah Greek, MD   BP 100/51 mmHg  Pulse 78  Temp(Src) 98.5 F (36.9 C) (Oral)  Resp 18  SpO2 99%  LMP  05/13/2013 Physical Exam  Constitutional: She is oriented to person, place, and time. She appears well-developed and well-nourished. No distress.  HENT:  Head: Normocephalic and atraumatic.  Mouth/Throat: Oropharynx is clear and moist.  Eyes: Pupils are equal, round, and reactive to light.  Neck: Normal range of motion. Neck supple.  Cardiovascular: Normal rate, regular rhythm and normal heart sounds.  Exam reveals no gallop and no friction rub.   No murmur heard. Pulmonary/Chest: Effort normal. No respiratory distress. She has wheezes. She has no rales.  Neurological: She is alert and oriented to person, place, and time. She exhibits normal muscle tone. Coordination normal.  Skin: Skin is warm and dry. No rash noted. No erythema.  Nursing note and vitals reviewed.   ED Course  Procedures (including critical care time) Labs Review Labs Reviewed - No data to display  Imaging Review Dg Chest 2 View  06/13/2014   CLINICAL DATA:  Asthma with wheezing and productive cough 1 week. Central chest pain.  EXAM: CHEST  2 VIEW  COMPARISON:  02/01/2014  FINDINGS: The heart size and mediastinal contours are within normal limits. Both lungs are clear. The visualized skeletal structures are unremarkable.  IMPRESSION: No active cardiopulmonary disease.   Electronically Signed   By: Marin Olp M.D.   On: 06/13/2014 22:48     EKG Interpretation   Date/Time:  Sunday June 13 2014 21:41:35 EST Ventricular Rate:  86 PR Interval:  148 QRS Duration: 74 QT Interval:  408 QTC Calculation: 488 R Axis:   35 Text Interpretation:  Sinus rhythm Low voltage, precordial leads  Borderline T wave abnormalities Borderline prolonged QT interval Baseline  wander in lead(s) V6 Sinus rhythm Artifact T wave abnormality Abnormal ekg  Confirmed by Carmin Muskrat  MD ([id number]) on 06/13/2014 9:56:54 PM      MDM   Final diagnoses:  None     Patient was treated with an hour-long neb treatment and IV steroids.   She is advised to return here as needed.  She ambulated without difficulty and did not have any complaints of shortness of breath at that time.  Patient is advised to increase her fluid intake and rest as much as possible   Brent General, PA-C 06/14/14 0031  Carmin Muskrat, MD 06/15/14 1510

## 2014-09-03 ENCOUNTER — Emergency Department (HOSPITAL_COMMUNITY): Payer: Self-pay

## 2014-09-03 ENCOUNTER — Inpatient Hospital Stay (HOSPITAL_COMMUNITY)
Admission: EM | Admit: 2014-09-03 | Discharge: 2014-09-06 | DRG: 203 | Disposition: A | Discharge status: Home / Self Care | Payer: Self-pay | Attending: Internal Medicine | Admitting: Internal Medicine

## 2014-09-03 ENCOUNTER — Encounter (HOSPITAL_COMMUNITY): Payer: Self-pay | Admitting: Physical Medicine and Rehabilitation

## 2014-09-03 DIAGNOSIS — Z833 Family history of diabetes mellitus: Secondary | ICD-10-CM

## 2014-09-03 DIAGNOSIS — F319 Bipolar disorder, unspecified: Secondary | ICD-10-CM | POA: Diagnosis present

## 2014-09-03 DIAGNOSIS — Z8249 Family history of ischemic heart disease and other diseases of the circulatory system: Secondary | ICD-10-CM

## 2014-09-03 DIAGNOSIS — D509 Iron deficiency anemia, unspecified: Secondary | ICD-10-CM | POA: Diagnosis present

## 2014-09-03 DIAGNOSIS — Z90710 Acquired absence of both cervix and uterus: Secondary | ICD-10-CM

## 2014-09-03 DIAGNOSIS — Z23 Encounter for immunization: Secondary | ICD-10-CM

## 2014-09-03 DIAGNOSIS — Z825 Family history of asthma and other chronic lower respiratory diseases: Secondary | ICD-10-CM

## 2014-09-03 DIAGNOSIS — J45901 Unspecified asthma with (acute) exacerbation: Principal | ICD-10-CM | POA: Diagnosis present

## 2014-09-03 DIAGNOSIS — J069 Acute upper respiratory infection, unspecified: Secondary | ICD-10-CM | POA: Diagnosis present

## 2014-09-03 DIAGNOSIS — F209 Schizophrenia, unspecified: Secondary | ICD-10-CM | POA: Diagnosis present

## 2014-09-03 DIAGNOSIS — Z9114 Patient's other noncompliance with medication regimen: Secondary | ICD-10-CM | POA: Diagnosis present

## 2014-09-03 LAB — I-STAT TROPONIN, ED: Troponin i, poc: 0 ng/mL (ref 0.00–0.08)

## 2014-09-03 LAB — CBC
HCT: 39 % (ref 36.0–46.0)
HCT: 38.5 % (ref 36.0–46.0)
HEMOGLOBIN: 12.2 g/dL (ref 12.0–15.0)
Hemoglobin: 12.3 g/dL (ref 12.0–15.0)
MCH: 27.6 pg (ref 26.0–34.0)
MCH: 27.7 pg (ref 26.0–34.0)
MCHC: 31.5 g/dL (ref 30.0–36.0)
MCHC: 31.7 g/dL (ref 30.0–36.0)
MCV: 87.6 fL (ref 78.0–100.0)
MCV: 87.5 fL (ref 78.0–100.0)
Platelets: 267 10*3/uL (ref 150–400)
Platelets: 257 10*3/uL (ref 150–400)
RBC: 4.45 MIL/uL (ref 3.87–5.11)
RBC: 4.4 MIL/uL (ref 3.87–5.11)
RDW: 13.2 % (ref 11.5–15.5)
RDW: 13.2 % (ref 11.5–15.5)
WBC: 9.3 10*3/uL (ref 4.0–10.5)
WBC: 8.2 10*3/uL (ref 4.0–10.5)

## 2014-09-03 LAB — BASIC METABOLIC PANEL
Anion gap: 5 (ref 5–15)
BUN: 7 mg/dL (ref 6–23)
CALCIUM: 8.8 mg/dL (ref 8.4–10.5)
CHLORIDE: 105 mmol/L (ref 96–112)
CO2: 29 mmol/L (ref 19–32)
Creatinine, Ser: 0.67 mg/dL (ref 0.50–1.10)
GFR calc Af Amer: >90 mL/min (ref >90)
GFR calc non Af Amer: >90 mL/min (ref >90)
Glucose, Bld: 87 mg/dL (ref 70–99)
Potassium: 3.6 mmol/L (ref 3.5–5.1)
Sodium: 139 mmol/L (ref 135–145)

## 2014-09-03 LAB — MAGNESIUM: Magnesium: 1.8 mg/dL (ref 1.5–2.5)

## 2014-09-03 LAB — POC URINE PREG, ED: PREG TEST UR: NEGATIVE

## 2014-09-03 LAB — RETICULOCYTES
RBC.: 4.4 MIL/uL (ref 3.87–5.11)
RETIC COUNT ABSOLUTE: 48.4 10*3/uL (ref 19.0–186.0)
RETIC CT PCT: 1.1 % (ref 0.4–3.1)

## 2014-09-03 MED ORDER — ALBUTEROL (5 MG/ML) CONTINUOUS INHALATION SOLN
5.0000 mg/h | INHALATION_SOLUTION | Freq: Once | RESPIRATORY_TRACT | Status: AC
  Administered 2014-09-03: 5 mg/h via RESPIRATORY_TRACT
  Filled 2014-09-03: qty 20

## 2014-09-03 MED ORDER — POTASSIUM CHLORIDE IN NACL 20-0.9 MEQ/L-% IV SOLN
INTRAVENOUS | Status: AC
  Administered 2014-09-03: 21:00:00 via INTRAVENOUS
  Filled 2014-09-03 (×2): qty 1000

## 2014-09-03 MED ORDER — METHYLPREDNISOLONE SODIUM SUCC 125 MG IJ SOLR
125.0000 mg | Freq: Once | INTRAMUSCULAR | Status: AC
  Administered 2014-09-03: 125 mg via INTRAVENOUS
  Filled 2014-09-03: qty 2

## 2014-09-03 MED ORDER — IPRATROPIUM-ALBUTEROL 0.5-2.5 (3) MG/3ML IN SOLN
3.0000 mL | RESPIRATORY_TRACT | Status: DC
  Administered 2014-09-03: 3 mL via RESPIRATORY_TRACT

## 2014-09-03 MED ORDER — INFLUENZA VAC SPLIT QUAD 0.5 ML IM SUSY
0.5000 mL | PREFILLED_SYRINGE | INTRAMUSCULAR | Status: AC
  Administered 2014-09-04: 0.5 mL via INTRAMUSCULAR
  Filled 2014-09-03: qty 0.5

## 2014-09-03 MED ORDER — IPRATROPIUM-ALBUTEROL 0.5-2.5 (3) MG/3ML IN SOLN
3.0000 mL | Freq: Four times a day (QID) | RESPIRATORY_TRACT | Status: DC
  Administered 2014-09-04 – 2014-09-06 (×11): 3 mL via RESPIRATORY_TRACT
  Filled 2014-09-03 (×11): qty 3

## 2014-09-03 MED ORDER — IPRATROPIUM-ALBUTEROL 0.5-2.5 (3) MG/3ML IN SOLN
RESPIRATORY_TRACT | Status: AC
  Administered 2014-09-03: 3 mL via RESPIRATORY_TRACT
  Filled 2014-09-03: qty 3

## 2014-09-03 MED ORDER — GUAIFENESIN-DM 100-10 MG/5ML PO SYRP
5.0000 mL | ORAL_SOLUTION | Freq: Four times a day (QID) | ORAL | Status: DC
  Administered 2014-09-04 – 2014-09-05 (×6): 5 mL via ORAL
  Filled 2014-09-03 (×11): qty 5

## 2014-09-03 MED ORDER — METHYLPREDNISOLONE SODIUM SUCC 125 MG IJ SOLR
80.0000 mg | Freq: Four times a day (QID) | INTRAMUSCULAR | Status: DC
  Administered 2014-09-03 – 2014-09-04 (×3): 80 mg via INTRAVENOUS
  Filled 2014-09-03: qty 2
  Filled 2014-09-03: qty 1.28
  Filled 2014-09-03: qty 2
  Filled 2014-09-03 (×4): qty 1.28

## 2014-09-03 MED ORDER — SODIUM CHLORIDE 0.9 % IV BOLUS (SEPSIS)
500.0000 mL | Freq: Once | INTRAVENOUS | Status: AC
  Administered 2014-09-03: 500 mL via INTRAVENOUS

## 2014-09-03 MED ORDER — ENOXAPARIN SODIUM 40 MG/0.4ML ~~LOC~~ SOLN
40.0000 mg | SUBCUTANEOUS | Status: DC
  Administered 2014-09-04 – 2014-09-05 (×3): 40 mg via SUBCUTANEOUS
  Filled 2014-09-03 (×4): qty 0.4

## 2014-09-03 MED ORDER — PNEUMOCOCCAL VAC POLYVALENT 25 MCG/0.5ML IJ INJ
0.5000 mL | INJECTION | INTRAMUSCULAR | Status: AC
  Administered 2014-09-04: 0.5 mL via INTRAMUSCULAR
  Filled 2014-09-03: qty 0.5

## 2014-09-03 MED ORDER — ALBUTEROL SULFATE (2.5 MG/3ML) 0.083% IN NEBU
5.0000 mg | INHALATION_SOLUTION | Freq: Once | RESPIRATORY_TRACT | Status: AC
  Administered 2014-09-03: 5 mg via RESPIRATORY_TRACT
  Filled 2014-09-03: qty 6

## 2014-09-03 NOTE — H&P (Signed)
Date: 09/03/2014               Patient Name:  Sherri Shaw MRN: [id number]  DOB: 17-May-1981 Age / Sex: 34 y.o., female   PCP: No Pcp Per Patient         Medical Service: Internal Medicine Teaching Service         Attending Physician: Dr. Aldine Contes, MD    First Contact: Dr. Julious Oka Pager: [phone]  Second Contact: Dr. Bing Neighbors Pager: [phone]       After Hours (After 5p/  First Contact Pager: [phone]  weekends / holidays): Second Contact Pager: [phone]   Chief Complaint: shortness of breath  History of Present Illness: Pt is a 34 y/o female w/ PMHx of asthma who presented to the ED w/ SOB and DOE. Pt has been sick since December w/ a cold and has had difficulty breathing since then. SOB is associated w/ CP. CP is a tight feeling when she has SOB. CP is non radiating. She has not used her albuterol inhaler b/c she has been out of her albuterol inhaler as she has not seen a PCP in a while to prescribe it to her. Since December she has noticed DOE but was unable to quantify it in the room.  She used her mother's nebulizing treatments twice today without relief and then went to the ED. She last had an asthma exacerbation requiring hospitalization 36 years ago. She has never had to be intubated. Pt endorses productive cough of yellow sputum, NS, and chills.  Denies n/v, fevers, diarrhea, dysuria, and sick contacts. Pt does not smoke, drink EtOH, or use illicit drugs. She does not have any pets or is around 2nd hand smoke. She does not know what may have brought on this episode of SOB.   Last seen in the ED on 11/8 w/ asthma exacerbation and productive cough. She was given 1 hour long nebulizing tx and IV steroids. She was then d/cd from the ED after SOB improved and advised to increase her fluid intake.   Meds: Current Facility-Administered Medications  Medication Dose Route Frequency Provider Last Rate Last Dose  . 0.9 % NaCl with KCl 20 mEq/ L  infusion   Intravenous  Continuous Ejiroghene E Emokpae, MD      . enoxaparin (LOVENOX) injection 40 mg  40 mg Subcutaneous Q24H Ejiroghene E Emokpae, MD      . guaiFENesin-dextromethorphan (ROBITUSSIN DM) 100-10 MG/5ML syrup 5 mL  5 mL Oral Q6H Ejiroghene Arlyce Dice, MD      . Derrill Memo ON 09/04/2014] Influenza vac split quadrivalent PF (FLUARIX) injection 0.5 mL  0.5 mL Intramuscular Tomorrow-1000 Nischal Narendra, MD      . Derrill Memo ON 09/04/2014] ipratropium-albuterol (DUONEB) 0.5-2.5 (3) MG/3ML nebulizer solution 3 mL  3 mL Nebulization Q6H Ejiroghene E Emokpae, MD      . methylPREDNISolone sodium succinate (SOLU-MEDROL) 125 mg/2 mL injection 80 mg  80 mg Intravenous Q6H Ejiroghene E Emokpae, MD      . Derrill Memo ON 09/04/2014] pneumococcal 23 valent vaccine (PNU-IMMUNE) injection 0.5 mL  0.5 mL Intramuscular Tomorrow-1000 Aldine Contes, MD        Allergies: Allergies as of 09/03/2014  . (No Known Allergies)   Past Medical History  Diagnosis Date  . Asthma   . Chronic bronchitis     "I get it q yr" (04/07/2013)  . Anemia   . History of blood transfusion 04/07/2013    "got my first transfusion today; got 4 units" (  04/07/2013)  . Bipolar disorder     sister denies this hx on 04/07/2013  . Schizophrenia     "don't really know the type"/sister (04/07/2013)  . Microcytic anemia     Archie Endo 04/07/2013  . Exertional shortness of breath     ? due to anemia   Past Surgical History  Procedure Laterality Date  . No past surgeries    . Abdominal hysterectomy N/A 06/01/2013    Procedure: HYSTERECTOMY ABDOMINAL;  Surgeon: Lavonia Drafts, MD;  Location: [facility];  Service: Gynecology;  Laterality: N/A;  . Bilateral salpingectomy Bilateral 06/01/2013    Procedure: BILATERAL SALPINGECTOMY;  Surgeon: Lavonia Drafts, MD;  Location: [facility];  Service: Gynecology;  Laterality: Bilateral;  . Cystoscopy w/ ureteral stent placement Bilateral 06/01/2013    Procedure: CYSTOSCOPY WITH BILATERAL STENT REPLACEMENT;  Surgeon: Irine Seal, MD;  Location: [facility];  Service: Urology;  Laterality: Bilateral;   Family History  Problem Relation Age of Onset  . Hypertension Mother   . Diabetes Mother   . Asthma Mother   . Asthma Brother    History   Social History  . Marital Status: Single    Spouse Name: N/A    Number of Children: N/A  . Years of Education: Coll   Occupational History  . Not on file.   Social History Main Topics  . Smoking status: Never Smoker   . Smokeless tobacco: Never Used  . Alcohol Use: No  . Drug Use: No  . Sexual Activity: Not Currently   Other Topics Concern  . 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  Physical Exam: Blood pressure 120/68, pulse 86, temperature 98.7 F (37.1 C), temperature source Oral, resp. rate 18, height 5\' 2"  (1.575 m), weight 141 lb 12.1 oz (64.3 kg), last menstrual period 05/13/2013, SpO2 98 %. General: NAD, sitting up on exam chair HEENT: post nasal drip, non tender to percussion of frontal/maxillary/ethmoid sinuses, pupils reactive to light Lungs: diffuse wheezing throughout all lung fields Cardiac: RRR, no murmurs, no chest wall tenderness GI: soft, active bowel sounds, non TTP Neuro: CN II-XII grossly intact Ext: no pedal edema   Lab results: Basic Metabolic Panel:  Recent Labs  09/03/14 1405  NA 139  K 3.6  CL 105  CO2 29  GLUCOSE 87  BUN 7  CREATININE 0.67  CALCIUM 8.8   CBC:  Recent Labs  09/03/14 1405  WBC 9.3  HGB 12.3  HCT 39.0  MCV 87.6  PLT 267    Imaging results:  Dg Chest 2 View  09/03/2014   CLINICAL DATA:  Chest pain for 1 month, history of asthma  EXAM: CHEST  2 VIEW  COMPARISON:  PA and lateral chest x-ray of June 13, 2014  FINDINGS: The lungs are adequately inflated. There is no focal infiltrate. There is no pleural effusion. The heart and pulmonary vascularity  are normal. The mediastinum is normal in width. The bony thorax is unremarkable.  IMPRESSION: There is no active cardiopulmonary disease.   Electronically Signed   By: David  Martinique   On: 09/03/2014 14:12    Other results: EKG: NSR, new q waves noted in V2 from EKG in 06/13/14  Assessment & Plan by Problem: Active Problems:   Asthma exacerbation  Asthma exacerbation-- pt has had increased SOB and DOE. Likely 2/2 non  compliance w/ albuterol inhaler and URI as she states she has been sick since December. Diffuse wheezing on physical exam. CXR negative. Albuterol nebulizer and IV solumedrol 125mg  given in the ED.  - admit to med surg - RSV, flu panel pending - IV solmedrol 80mg  q6h  - duonebs q4h  - IVF 0.9%NS w/ KCl 75mEq at 100 c/ hr - Mag level - repeat CXR in the morning - repeat EKG in the morning  DVT ppx-- lovenox   Dispo: Disposition is deferred at this time, awaiting improvement of current medical problems. Anticipated discharge in approximately 1-2 day(s).   The patient does not have a current PCP (No Pcp Per Patient) and does need an [facility] hospital follow-up appointment after discharge.  The patient does not have transportation limitations that hinder transportation to clinic appointments.  Signed: Julious Oka, MD 09/03/2014, 8:42 PM

## 2014-09-03 NOTE — ED Notes (Signed)
Pt presents to department for evaluation of productive cough, sinus congestion and headache. States she ran out of inhaler at home, history of asthma. Respirations unlabored, speaking complete sentences. No signs of acute distress noted.

## 2014-09-03 NOTE — ED Notes (Signed)
Spoke with main lab - will add on troponin

## 2014-09-03 NOTE — ED Provider Notes (Signed)
CSN: [id number]     Arrival date & time 09/03/14  1220 History   First MD Initiated Contact with Patient 09/03/14 1328     Chief Complaint  Patient presents with  . Cough  . Asthma  . Facial Pain     (Consider location/radiation/quality/duration/timing/severity/associated sxs/prior Treatment) Patient is a 34 y.o. female presenting with cough and asthma. The history is provided by the patient. No language interpreter was used.  Cough Associated symptoms: shortness of breath   Associated symptoms: no chest pain, no chills, no fever, no headaches and no sore throat   Asthma Associated symptoms include coughing. Pertinent negatives include no chest pain, chills, congestion, fever, headaches or sore throat.  Sherri Shaw is a 34 year old female with past medical history of asthma, anemia, bipolar disorder, schizophrenia presenting to the ED with shortness of breath and worsening of asthma that started this morning. Patient reported that she does have history of asthma, reported that she has not been taking her medications as prescribed since December. Reported that she's been using her mom's nebulizer without relief. Stated that she used the nebulizer at least 3 times this morning with minimal relief or changes to her shortness of breath. Stated that she's been having productive cough of yellowish phlegm, mild sore throat, chest tightness. Stated that her breathing has gotten progressively worse. Nasal congestion, fever, neck pain, neck stiffness, chest pain, difficulty swallowing. PCP none  Past Medical History  Diagnosis Date  . Asthma   . Chronic bronchitis     "I get it q yr" (04/07/2013)  . Anemia   . History of blood transfusion 04/07/2013    "got my first transfusion today; got 4 units" (04/07/2013)  . Bipolar disorder     sister denies this hx on 04/07/2013  . Schizophrenia     "don't really know the type"/sister (04/07/2013)  . Microcytic anemia     Archie Endo 04/07/2013  . Exertional  shortness of breath     ? due to anemia   Past Surgical History  Procedure Laterality Date  . No past surgeries    . Abdominal hysterectomy N/A 06/01/2013    Procedure: HYSTERECTOMY ABDOMINAL;  Surgeon: Lavonia Drafts, MD;  Location: [facility];  Service: Gynecology;  Laterality: N/A;  . Bilateral salpingectomy Bilateral 06/01/2013    Procedure: BILATERAL SALPINGECTOMY;  Surgeon: Lavonia Drafts, MD;  Location: [facility];  Service: Gynecology;  Laterality: Bilateral;  . Cystoscopy w/ ureteral stent placement Bilateral 06/01/2013    Procedure: CYSTOSCOPY WITH BILATERAL STENT REPLACEMENT;  Surgeon: Irine Seal, MD;  Location: [facility];  Service: Urology;  Laterality: Bilateral;   Family History  Problem Relation Age of Onset  . Hypertension Mother   . Diabetes Mother   . Asthma Mother   . Asthma Brother    History  Substance Use Topics  . Smoking status: Never Smoker   . Smokeless tobacco: Never Used  . Alcohol Use: No   OB History    Gravida Para Term Preterm AB TAB SAB Ectopic Multiple Living   0 0 0 0 0 0 0 0 0 0      Review of Systems  Constitutional: Negative for fever and chills.  HENT: Negative for congestion, sore throat and trouble swallowing.   Respiratory: Positive for cough, chest tightness and shortness of breath.   Cardiovascular: Negative for chest pain.  Neurological: Negative for headaches.      Allergies  Review of patient's allergies indicates no known allergies.  Home Medications   Prior to Admission  medications   Medication Sig Start Date End Date Taking? Authorizing Provider  albuterol (PROVENTIL HFA;VENTOLIN HFA) 108 (90 BASE) MCG/ACT inhaler Inhale 2 puffs into the lungs every 6 (six) hours as needed for wheezing or shortness of breath. 02/01/14  Yes Orpah Greek, MD  amoxicillin (AMOXIL) 500 MG capsule Take 1 capsule (500 mg total) by mouth 3 (three) times daily. Patient not taking: Reported on 06/13/2014 02/01/14   Orpah Greek, MD  clotrimazole-betamethasone (LOTRISONE) cream Apply to affected area 2 times daily prn Patient not taking: Reported on 06/13/2014 02/01/14   Orpah Greek, MD  Guaifenesin 1200 MG TB12 Take 1 tablet (1,200 mg total) by mouth 2 (two) times daily. Patient not taking: Reported on 09/03/2014 06/14/14   Resa Miner Lawyer, PA-C  predniSONE (DELTASONE) 50 MG tablet Take 1 tablet (50 mg total) by mouth daily. Patient not taking: Reported on 09/03/2014 06/14/14   Resa Miner Lawyer, PA-C  promethazine-dextromethorphan (PROMETHAZINE-DM) 6.25-15 MG/5ML syrup Take 5 mLs by mouth 4 (four) times daily as needed for cough. Patient not taking: Reported on 09/03/2014 06/14/14   Resa Miner Lawyer, PA-C   BP 135/71 mmHg  Pulse 80  Temp(Src) 98.5 F (36.9 C) (Oral)  Resp 18  SpO2 94%  LMP 05/13/2013 Physical Exam  Constitutional: She is oriented to person, place, and time. She appears well-developed and well-nourished. No distress.  HENT:  Head: Normocephalic and atraumatic.  Mouth/Throat: Oropharynx is clear and moist. No oropharyngeal exudate.  Eyes: Conjunctivae and EOM are normal. Right eye exhibits no discharge. Left eye exhibits no discharge.  Neck: Normal range of motion. Neck supple.  Cardiovascular: Normal rate, regular rhythm and normal heart sounds.  Exam reveals no friction rub.   No murmur heard. Pulses:      Radial pulses are 2+ on the right side, and 2+ on the left side.  Cap refill less than 3 seconds  Pulmonary/Chest: No respiratory distress. She has wheezes. She has no rales. She exhibits no tenderness.  Increased effort to breathe Expiratory wheezes identified upper lower lobes bilaterally with rhonchi Poor lung expansion  Negative use of accessory muscles Negative stridor  Musculoskeletal: Normal range of motion.  Neurological: She is alert and oriented to person, place, and time. No cranial nerve deficit. She exhibits normal muscle tone. Coordination normal.   Skin: Skin is warm and dry. No rash noted. She is not diaphoretic. No erythema.  Psychiatric: She has a normal mood and affect. Her behavior is normal. Thought content normal.  Nursing note and vitals reviewed.   ED Course  Procedures (including critical care time)  Results for orders placed or performed during the hospital encounter of 09/03/14  CBC  Result Value Ref Range   WBC 9.3 4.0 - 10.5 K/uL   RBC 4.45 3.87 - 5.11 MIL/uL   Hemoglobin 12.3 12.0 - 15.0 g/dL   HCT 39.0 36.0 - 46.0 %   MCV 87.6 78.0 - 100.0 fL   MCH 27.6 26.0 - 34.0 pg   MCHC 31.5 30.0 - 36.0 g/dL   RDW 13.2 11.5 - 15.5 %   Platelets 267 150 - 400 K/uL  Basic metabolic panel  Result Value Ref Range   Sodium 139 135 - 145 mmol/L   Potassium 3.6 3.5 - 5.1 mmol/L   Chloride 105 96 - 112 mmol/L   CO2 29 19 - 32 mmol/L   Glucose, Bld 87 70 - 99 mg/dL   BUN 7 6 - 23 mg/dL   Creatinine, Ser  0.67 0.50 - 1.10 mg/dL   Calcium 8.8 8.4 - 10.5 mg/dL   GFR calc non Af Amer >90 >90 mL/min   GFR calc Af Amer >90 >90 mL/min   Anion gap 5 5 - 15  POC urine preg, ED (not at [facility])  Result Value Ref Range   Preg Test, Ur NEGATIVE NEGATIVE  I-Stat Troponin, ED (not at [facility])  Result Value Ref Range   Troponin i, poc 0.00 0.00 - 0.08 ng/mL   Comment 3            Labs Review Labs Reviewed  CBC  BASIC METABOLIC PANEL  POC URINE PREG, ED  I-STAT TROPOININ, ED    Imaging Review Dg Chest 2 View  09/03/2014   CLINICAL DATA:  Chest pain for 1 month, history of asthma  EXAM: CHEST  2 VIEW  COMPARISON:  PA and lateral chest x-ray of June 13, 2014  FINDINGS: The lungs are adequately inflated. There is no focal infiltrate. There is no pleural effusion. The heart and pulmonary vascularity are normal. The mediastinum is normal in width. The bony thorax is unremarkable.  IMPRESSION: There is no active cardiopulmonary disease.   Electronically Signed   By: David  Martinique   On: 09/03/2014 14:12     EKG  Interpretation   Date/Time:  Friday September 03 2014 15:19:59 EST Ventricular Rate:  84 PR Interval:  148 QRS Duration: 72 QT Interval:  344 QTC Calculation: 406 R Axis:   75 Text Interpretation:  Normal sinus rhythm Septal infarct , age  undetermined Abnormal ECG No significant change since last tracing  Confirmed by YAO  MD, DAVID ([id number]) on 09/03/2014 3:24:29 PM       3:17 PM This provider reassess the patient. Patient continues to have inspiratory wheezing and rhonchi. Continuous nebulizer ordered. IV placed with IV Solu-Medrol to be placed in given. Cusp possible admission with patient who agrees to plan of care.  4:23 PM This provider spoke with Dr. Denton Brick, IM Resident. Discussed case, labs, imaging, vitals, ED course in great detail. Patient to be seen and assessed.   5:43 PM IM Residents at bedside assessing patient.   MDM   Final diagnoses:  Acute asthma exacerbation, unspecified asthma severity    Medications  albuterol (PROVENTIL) (2.5 MG/3ML) 0.083% nebulizer solution 5 mg (5 mg Nebulization Given 09/03/14 1326)  methylPREDNISolone sodium succinate (SOLU-MEDROL) 125 mg/2 mL injection 125 mg (125 mg Intravenous Given 09/03/14 1536)  albuterol (PROVENTIL,VENTOLIN) solution continuous neb (5 mg/hr Nebulization Given 09/03/14 1548)  sodium chloride 0.9 % bolus 500 mL (500 mLs Intravenous New Bag/Given 09/03/14 1536)    Filed Vitals:   09/03/14 1240 09/03/14 1551  BP: 135/71   Pulse: 79 80  Temp: 98.5 F (36.9 C)   TempSrc: Oral   Resp: 18 18  SpO2: 93% 94%   EKG sinus rhythm with a heart rate of 84 bpm. i-STAT troponin negative elevation. CBC negative elevated white blood cell count. Hemoglobin 12.3, hematocrit 39.0. BMP unremarkable. Urine pregnancy negative. Chest x-ray negative for acute cardiopulmonary disease. Patient presenting to the ED with what appears to be acute asthma exacerbation. Nebulizer treatment, IV Solu-Medrol, continuous nebulizer given in ED  setting. Patient continues to have inspiratory and expiratory wheezes as well as rhonchi. Pulse ox 93% on room air. Negative signs of respiratory distress. This provider spoke with internal medicine teaching services to come and assess patient and admit patient for acute asthma exacerbation. Discussed plan for admission with patient who agrees  to plan of care. Patient stable for transfer. Discussed case with Abigail Butts, PA-C. Hannah Muthersbaugh, PA-C to monitor patient until moved to floor.   Jamse Mead, PA-C 09/03/14 [place], PA-C 09/03/14 1756  Orpah Greek, MD 09/06/14 [phone]

## 2014-09-03 NOTE — ED Notes (Signed)
Breathing treatment complete 

## 2014-09-04 ENCOUNTER — Observation Stay (HOSPITAL_COMMUNITY): Payer: Self-pay

## 2014-09-04 LAB — CBC
HCT: 38.8 % (ref 36.0–46.0)
Hemoglobin: 12.2 g/dL (ref 12.0–15.0)
MCH: 27.4 pg (ref 26.0–34.0)
MCHC: 31.4 g/dL (ref 30.0–36.0)
MCV: 87.2 fL (ref 78.0–100.0)
PLATELETS: 273 10*3/uL (ref 150–400)
RBC: 4.45 MIL/uL (ref 3.87–5.11)
RDW: 13.4 % (ref 11.5–15.5)
WBC: 5.8 10*3/uL (ref 4.0–10.5)

## 2014-09-04 LAB — IRON AND TIBC
Iron: 26 ug/dL — ABNORMAL LOW (ref 42–145)
Saturation Ratios: 8 % — ABNORMAL LOW (ref 20–55)
TIBC: 338 ug/dL (ref 250–470)
UIBC: 312 ug/dL (ref 125–400)

## 2014-09-04 LAB — VITAMIN B12: Vitamin B-12: 500 pg/mL (ref 211–911)

## 2014-09-04 LAB — FOLATE: Folate: >20 ng/mL

## 2014-09-04 LAB — FERRITIN: FERRITIN: 29 ng/mL (ref 10–291)

## 2014-09-04 MED ORDER — DM-GUAIFENESIN ER 30-600 MG PO TB12
1.0000 | ORAL_TABLET | Freq: Two times a day (BID) | ORAL | Status: DC | PRN
  Filled 2014-09-04: qty 1

## 2014-09-04 MED ORDER — METHYLPREDNISOLONE SODIUM SUCC 40 MG IJ SOLR
40.0000 mg | Freq: Three times a day (TID) | INTRAMUSCULAR | Status: DC
  Administered 2014-09-04 – 2014-09-05 (×3): 40 mg via INTRAVENOUS
  Filled 2014-09-04 (×7): qty 1

## 2014-09-04 MED ORDER — MOMETASONE FURO-FORMOTEROL FUM 100-5 MCG/ACT IN AERO
2.0000 | INHALATION_SPRAY | Freq: Two times a day (BID) | RESPIRATORY_TRACT | Status: DC
  Administered 2014-09-04 – 2014-09-06 (×4): 2 via RESPIRATORY_TRACT
  Filled 2014-09-04: qty 8.8

## 2014-09-04 MED ORDER — FERROUS SULFATE 325 (65 FE) MG PO TABS
325.0000 mg | ORAL_TABLET | Freq: Three times a day (TID) | ORAL | Status: DC
  Administered 2014-09-04: 325 mg via ORAL
  Filled 2014-09-04 (×3): qty 1

## 2014-09-04 MED ORDER — ALBUTEROL SULFATE (2.5 MG/3ML) 0.083% IN NEBU: 3.0000 mL | INHALATION_SOLUTION | Freq: Four times a day (QID) | RESPIRATORY_TRACT | Status: DC | PRN

## 2014-09-04 MED ORDER — DOCUSATE SODIUM 100 MG PO CAPS
100.0000 mg | ORAL_CAPSULE | Freq: Every day | ORAL | Status: DC
  Administered 2014-09-04: 100 mg via ORAL
  Filled 2014-09-04: qty 1

## 2014-09-04 MED ORDER — BENZONATATE 100 MG PO CAPS
100.0000 mg | ORAL_CAPSULE | Freq: Two times a day (BID) | ORAL | Status: DC | PRN
  Administered 2014-09-05: 100 mg via ORAL
  Filled 2014-09-04 (×2): qty 1

## 2014-09-04 NOTE — Progress Notes (Signed)
UR completed 

## 2014-09-04 NOTE — Progress Notes (Signed)
Subjective: Pt has no complaints this morning. SOB has improved.  Objective: Vital signs in last 24 hours: Filed Vitals:   09/03/14 2021 09/03/14 2027 09/04/14 0717 09/04/14 0817  BP:  120/68 103/51   Pulse:  86 65   Temp:  98.7 F (37.1 C) 98.1 F (36.7 C)   TempSrc:  Oral Oral   Resp:  18 16   Height: 5\' 2"  (1.575 m)     Weight: 141 lb 12.1 oz (64.3 kg)     SpO2:  98% 97% 98%   Weight change:   Intake/Output Summary (Last 24 hours) at 09/04/14 1054 Last data filed at 09/04/14 0700  Gross per 24 hour  Intake    800 ml  Output      0 ml  Net    800 ml   General: NAD, laying in bed comfortably Lungs: diffuse rhonchi and wheezes throughout all lung fields Cardiac: RRR, no murmurs GI: soft, active bowel sounds, non TTP Neuro: CN II-XII grossly intact Ext: neg for pedal edema  Lab Results: Basic Metabolic Panel:  Recent Labs Lab 09/03/14 1405 09/03/14 2113  NA 139  --   K 3.6  --   CL 105  --   CO2 29  --   GLUCOSE 87  --   BUN 7  --   CREATININE 0.67  --   CALCIUM 8.8  --   MG  --  1.8   CBC:  Recent Labs Lab 09/03/14 2113 09/04/14 0528  WBC 8.2 5.8  HGB 12.2 12.2  HCT 38.5 38.8  MCV 87.5 87.2  PLT 257 273   Anemia Panel:  Recent Labs Lab 09/03/14 2113  VITAMINB12 500  FOLATE >20.0  FERRITIN 29  TIBC 338  IRON 26*  RETICCTPCT 1.1   Studies/Results: X-ray Chest Pa And Lateral  09/04/2014   CLINICAL DATA:  Acute exacerbation of pneumonia. Subsequent encounter.  EXAM: CHEST  2 VIEW  COMPARISON:  Chest radiograph performed 09/03/2014  FINDINGS: The lungs are well-aerated. Mild chronic peribronchial thickening is noted. There is no evidence of focal opacification, pleural effusion or pneumothorax.  The heart is normal in size; the mediastinal contour is within normal limits. No acute osseous abnormalities are seen.  IMPRESSION: Mild chronic peribronchial thickening noted; lungs remain grossly clear.   Electronically Signed   By: Garald Balding  M.D.   On: 09/04/2014 08:27   Dg Chest 2 View  09/03/2014   CLINICAL DATA:  Chest pain for 1 month, history of asthma  EXAM: CHEST  2 VIEW  COMPARISON:  PA and lateral chest x-ray of June 13, 2014  FINDINGS: The lungs are adequately inflated. There is no focal infiltrate. There is no pleural effusion. The heart and pulmonary vascularity are normal. The mediastinum is normal in width. The bony thorax is unremarkable.  IMPRESSION: There is no active cardiopulmonary disease.   Electronically Signed   By: David  [place]   On: 09/03/2014 14:12   Medications: I have reviewed the patient's current medications. Scheduled Meds: . enoxaparin (LOVENOX) injection  40 mg Subcutaneous Q24H  . guaiFENesin-dextromethorphan  5 mL Oral Q6H  . ipratropium-albuterol  3 mL Nebulization Q6H  . methylPREDNISolone (SOLU-MEDROL) injection  40 mg Intravenous 3 times per day   Continuous Infusions:  PRN Meds:. Assessment/Plan: Active Problems:   Asthma exacerbation   Asthma exacerbation--Likely 2/2 non compliance w/ albuterol inhaler and URI as she states she has been sick since December. Diffuse wheezing on physical exam  -  RSV pending - IV solmedrol 80mg  q6h transitioned to 40mg  q6h as significant diffuse wheezing on exam still present this morning - duonebs q4h  - Mag WNL - repeat CXR this morning reveals mild chronic peribronchial thickening, lungs clear  Iron deficiency anemia--anemia panel reveals Iron 26, TIBC WNL at 338, MCV 87.5 - start ferrous sulfate 325mg  TID w/colace   DVT ppx-- lovenox  Dispo: Disposition is deferred at this time, awaiting improvement of current medical problems. Anticipated discharge in approximately 1-2 day(s).   The patient does not have a current PCP (No Pcp Per Patient) and does need an [facility] hospital follow-up appointment after discharge.  The patient does not have transportation limitations that hinder transportation to clinic appointments.    LOS: 1 day   Julious Oka, MD 09/04/2014, 10:54 AM

## 2014-09-05 DIAGNOSIS — D509 Iron deficiency anemia, unspecified: Secondary | ICD-10-CM

## 2014-09-05 DIAGNOSIS — J45901 Unspecified asthma with (acute) exacerbation: Principal | ICD-10-CM

## 2014-09-05 MED ORDER — DM-GUAIFENESIN ER 30-600 MG PO TB12
1.0000 | ORAL_TABLET | Freq: Two times a day (BID) | ORAL | Status: DC
  Administered 2014-09-05 – 2014-09-06 (×3): 1 via ORAL
  Filled 2014-09-05 (×3): qty 1

## 2014-09-05 MED ORDER — AZITHROMYCIN 500 MG PO TABS: 500.0000 mg | ORAL_TABLET | Freq: Every day | ORAL | Status: DC

## 2014-09-05 MED ORDER — PREDNISONE 50 MG PO TABS
60.0000 mg | ORAL_TABLET | Freq: Every day | ORAL | Status: DC
  Administered 2014-09-05 – 2014-09-06 (×2): 60 mg via ORAL
  Filled 2014-09-05 (×4): qty 1

## 2014-09-05 MED ORDER — AZITHROMYCIN 250 MG PO TABS: 250.0000 mg | ORAL_TABLET | Freq: Every day | ORAL | Status: DC

## 2014-09-05 NOTE — Progress Notes (Signed)
Subjective:  Pt seen and examined in AM. No acute events overnight. Pt reports she is feeling okay. She continues to have mucous cough and mild chest tightness. She denies dyspnea or wheezing. She reports her peak flow yesterday was 300.    Objective: Vital signs in last 24 hours: Filed Vitals:   09/04/14 2109 09/05/14 0146 09/05/14 0547 09/05/14 0736  BP: 102/57  119/74   Pulse:   61   Temp: 98.2 F (36.8 C)  97.3 F (36.3 C)   TempSrc: Oral  Oral   Resp: 20  18   Height:      Weight:      SpO2: 95% 96% 98% 94%   Weight change:   Intake/Output Summary (Last 24 hours) at 09/05/14 1026 Last data filed at 09/05/14 0600  Gross per 24 hour  Intake    340 ml  Output   1150 ml  Net   -810 ml   PHYSICAL EXAMINATION: General: alert, well-developed, and cooperative to examination.  Head: normocephalic and atraumatic.  Eyes: EOMI Lungs: diffuse expiratory wheezing with decreased airflow bilaterally. No increased WOB.  Heart: normal rate, regular rhythm, no murmur Abdomen: soft, non-tender, normal bowel sounds, no distention, no guarding, no rebound tenderness Extremities: no edema Neurologic: alert & oriented X3 Psych: Oriented X3   Lab Results: Basic Metabolic Panel:  Recent Labs Lab 09/03/14 1405 09/03/14 2113  NA 139  --   K 3.6  --   CL 105  --   CO2 29  --   GLUCOSE 87  --   BUN 7  --   CREATININE 0.67  --   CALCIUM 8.8  --   MG  --  1.8   CBC:  Recent Labs Lab 09/03/14 2113 09/04/14 0528  WBC 8.2 5.8  HGB 12.2 12.2  HCT 38.5 38.8  MCV 87.5 87.2  PLT 257 273   Anemia Panel:  Recent Labs Lab 09/03/14 2113  VITAMINB12 500  FOLATE >20.0  FERRITIN 29  TIBC 338  IRON 26*  RETICCTPCT 1.1   Studies/Results: X-ray Chest Pa And Lateral  09/04/2014   CLINICAL DATA:  Acute exacerbation of pneumonia. Subsequent encounter.  EXAM: CHEST  2 VIEW  COMPARISON:  Chest radiograph performed 09/03/2014  FINDINGS: The lungs are well-aerated. Mild  chronic peribronchial thickening is noted. There is no evidence of focal opacification, pleural effusion or pneumothorax.  The heart is normal in size; the mediastinal contour is within normal limits. No acute osseous abnormalities are seen.  IMPRESSION: Mild chronic peribronchial thickening noted; lungs remain grossly clear.   Electronically Signed   By: Garald Balding M.D.   On: 09/04/2014 08:27   Dg Chest 2 View  09/03/2014   CLINICAL DATA:  Chest pain for 1 month, history of asthma  EXAM: CHEST  2 VIEW  COMPARISON:  PA and lateral chest x-ray of June 13, 2014  FINDINGS: The lungs are adequately inflated. There is no focal infiltrate. There is no pleural effusion. The heart and pulmonary vascularity are normal. The mediastinum is normal in width. The bony thorax is unremarkable.  IMPRESSION: There is no active cardiopulmonary disease.   Electronically Signed   By: David  [place]   On: 09/03/2014 14:12   Medications: I have reviewed the patient's current medications. Scheduled Meds: . enoxaparin (LOVENOX) injection  40 mg Subcutaneous Q24H  . guaiFENesin-dextromethorphan  5 mL Oral Q6H  . ipratropium-albuterol  3 mL Nebulization Q6H  . mometasone-formoterol  2 puff Inhalation  BID  . predniSONE  60 mg Oral Q breakfast   Continuous Infusions:  PRN Meds:.albuterol, benzonatate, dextromethorphan-guaiFENesin Assessment/Plan: Active Problems:   Asthma exacerbation  Acute exacerbation of asthma - Still with persistent expiratory wheezing and decreased airflow. Pt reports likely triggered by cold weather exposure vs URI. CXR with no infiltrate.  -Transition IV solumedrol 40 Q 8hr  To PO prednisone 60 mg today  -Continue duoneb treatments Q 4hrs and albuterol inhaler Q6 hr PRN -Continue dulera 2 puffs BID -Continue tessalon 100 mg BID PRN cough and mucinex DM  BID productive cough -CXR with no infiltrates to warrant antibiotics -Continue peak flow measurements  -F/u respiratory panel  -CM  consult for PCP follow-up with [facility] and affordability of medications/inhalers   History of Iron-deficiency anemia - Hg stable at 12.2 not currently on iron supplements. Pt is s/p hysterectomy in 2014 for massively enlarged uterus with multiple fibroids. Ferritin improved from 1 in 2014 to 29.  -Follow-up CBC as outpatient to ensure CBC remains stable -No indication for iron supplementation due to resolved anemia     Diet: Regular DVT PPx: Lovenox 40 mg daily    Dispo: Disposition is deferred at this time, awaiting improvement of current medical problems.  Anticipated discharge in approximately 1 day(s).   The patient does not have a current PCP (No Pcp Per Patient) and does need an [facility] hospital follow-up appointment after discharge.  The patient does not have transportation limitations that hinder transportation to clinic appointments.  .Services Needed at time of discharge: Y = Yes, Blank = No PT:  No  OT:  No  RN:  No  Equipment:  No  Other:     LOS: 2 days   Juluis Mire, MD 09/05/2014, 10:26 AM

## 2014-09-06 LAB — COMPREHENSIVE METABOLIC PANEL
ALT: 16 U/L (ref 0–35)
AST: 17 U/L (ref 0–37)
Albumin: 3 g/dL — ABNORMAL LOW (ref 3.5–5.2)
Alkaline Phosphatase: 42 U/L (ref 39–117)
Anion gap: 6 (ref 5–15)
BUN: 13 mg/dL (ref 6–23)
CALCIUM: 8.5 mg/dL (ref 8.4–10.5)
CHLORIDE: 106 mmol/L (ref 96–112)
CO2: 28 mmol/L (ref 19–32)
Creatinine, Ser: 0.68 mg/dL (ref 0.50–1.10)
GFR calc Af Amer: >90 mL/min (ref >90)
GLUCOSE: 92 mg/dL (ref 70–99)
Potassium: 3.6 mmol/L (ref 3.5–5.1)
Sodium: 140 mmol/L (ref 135–145)
Total Bilirubin: 0.5 mg/dL (ref 0.3–1.2)
Total Protein: 6 g/dL (ref 6.0–8.3)

## 2014-09-06 LAB — CBC
HCT: 36.4 % (ref 36.0–46.0)
Hemoglobin: 11.5 g/dL — ABNORMAL LOW (ref 12.0–15.0)
MCH: 27.4 pg (ref 26.0–34.0)
MCHC: 31.6 g/dL (ref 30.0–36.0)
MCV: 86.9 fL (ref 78.0–100.0)
PLATELETS: 261 10*3/uL (ref 150–400)
RBC: 4.19 MIL/uL (ref 3.87–5.11)
RDW: 13.6 % (ref 11.5–15.5)
WBC: 9.9 10*3/uL (ref 4.0–10.5)

## 2014-09-06 LAB — MAGNESIUM: Magnesium: 1.9 mg/dL (ref 1.5–2.5)

## 2014-09-06 MED ORDER — PREDNISONE 20 MG PO TABS: 60.0000 mg | ORAL_TABLET | Freq: Every day | ORAL | Status: DC

## 2014-09-06 MED ORDER — MOMETASONE FURO-FORMOTEROL FUM 100-5 MCG/ACT IN AERO: 2.0000 | INHALATION_SPRAY | Freq: Two times a day (BID) | RESPIRATORY_TRACT | Status: DC

## 2014-09-06 MED ORDER — ALBUTEROL SULFATE HFA 108 (90 BASE) MCG/ACT IN AERS: 2.0000 | INHALATION_SPRAY | Freq: Four times a day (QID) | RESPIRATORY_TRACT | Status: DC | PRN

## 2014-09-06 NOTE — Discharge Summary (Signed)
Name: Sherri Shaw MRN: [id number] DOB: April 12, 1981 34 y.o. PCP: No Pcp Per Patient  Date of Admission: 09/03/2014  1:00 PM Date of Discharge: 09/06/2014 Attending Physician: Karren Cobble, MD  Discharge Diagnosis: 1.  Active Problems:   Asthma exacerbation  Discharge Medications:   Medication List    STOP taking these medications        amoxicillin 500 MG capsule  Commonly known as:  AMOXIL     Guaifenesin 1200 MG Tb12     promethazine-dextromethorphan 6.25-15 MG/5ML syrup  Commonly known as:  PROMETHAZINE-DM      TAKE these medications        albuterol 108 (90 BASE) MCG/ACT inhaler  Commonly known as:  PROVENTIL HFA;VENTOLIN HFA  Inhale 2 puffs into the lungs every 6 (six) hours as needed for wheezing or shortness of breath.     mometasone-formoterol 100-5 MCG/ACT Aero  Commonly known as:  DULERA  Inhale 2 puffs into the lungs 2 (two) times daily.     predniSONE 20 MG tablet  Commonly known as:  DELTASONE  Take 3 tablets (60 mg total) by mouth daily with breakfast.        Disposition and follow-up:   [name] was discharged from [facility] in Good condition.  At the hospital follow up visit please address:  1.  Asthma symptoms- How often has she been needing to use her inhaler? Was she able to get her inhalers/medications?  2.  Labs / imaging needed at time of follow-up: CBC- follow anemia   3.  Pending labs/ test needing follow-up: Resp virus panel  Follow-up Appointments: Follow-up Information    Follow up with [facility]    .   Why:  Report to the [facility] at 9 AM tomorrow morning. It is important to do this so you can get your medications, including your albuterol inhaler.    Contact information:   [address] [place] [address] [phone]      Discharge Instructions: Discharge Instructions    Diet - low sodium heart healthy     Complete by:  As directed      Increase activity slowly    Complete by:  As directed            Consultations:    Procedures Performed:  X-ray Chest Pa And Lateral  09/04/2014   CLINICAL DATA:  Acute exacerbation of pneumonia. Subsequent encounter.  EXAM: CHEST  2 VIEW  COMPARISON:  Chest radiograph performed 09/03/2014  FINDINGS: The lungs are well-aerated. Mild chronic peribronchial thickening is noted. There is no evidence of focal opacification, pleural effusion or pneumothorax.  The heart is normal in size; the mediastinal contour is within normal limits. No acute osseous abnormalities are seen.  IMPRESSION: Mild chronic peribronchial thickening noted; lungs remain grossly clear.   Electronically Signed   By: Garald Balding M.D.   On: 09/04/2014 08:27   Dg Chest 2 View  09/03/2014   CLINICAL DATA:  Chest pain for 1 month, history of asthma  EXAM: CHEST  2 VIEW  COMPARISON:  PA and lateral chest x-ray of June 13, 2014  FINDINGS: The lungs are adequately inflated. There is no focal infiltrate. There is no pleural effusion. The heart and pulmonary vascularity are normal. The mediastinum is normal in width. The bony thorax is unremarkable.  IMPRESSION: There is no active cardiopulmonary disease.   Electronically Signed   By: Shanon Brow  [place]   On: 09/03/2014 14:12    Admission HPI: Pt is a 34 y/o female w/ PMHx of asthma who presented to the ED w/ SOB and DOE. Pt has been sick since December w/ a cold and has had difficulty breathing since then. SOB is associated w/ CP. CP is a tight feeling when she has SOB. CP is non radiating. She has not used her albuterol inhaler b/c she has been out of her albuterol inhaler as she has not seen a PCP in a while to prescribe it to her. Since December she has noticed DOE but was unable to quantify it in the room. She used her mother's nebulizing treatments twice today without relief and then went to the ED. She last had an asthma exacerbation requiring  hospitalization 36 years ago. She has never had to be intubated. Pt endorses productive cough of yellow sputum, NS, and chills. Denies n/v, fevers, diarrhea, dysuria, and sick contacts. Pt does not smoke, drink EtOH, or use illicit drugs. She does not have any pets or is around 2nd hand smoke. She does not know what may have brought on this episode of SOB.   Last seen in the ED on 11/8 w/ asthma exacerbation and productive cough. She was given 1 hour long nebulizing tx and IV steroids. She was then d/cd from the ED after SOB improved and advised to increase her fluid intake.   Hospital Course by problem list: Active Problems:   Asthma exacerbation   Asthma Exacerbation: Patient presented with increased SOB and dyspnea one exertion likely due to noncompliance with her albuterol inhaler. She had diffuse wheezing on exam. She was treated with IV Solu-medrol, Duonebs, and Dulera. She continued to improve and was transitioned to oral prednisone. Case management was consulted to help patient with resources for affording her albuterol inhaler. She was discharged home with albuterol and dulera inhalers and a 5 day prednisone taper. She is to follow up with [facility] tomorrow (09/07/14) at 9 AM to get her medications and establish care.   Hx of Iron-deficiency anemia: Hbg remained stable throughout her hospitalization. Pt is s/p hysterectomy in 2014 for massively enlarged uterus with multiple fibroids. Ferritin improved from 1 in 2014 to 29. There was no indication for iron supplementation due to her resolved anemia. She will have a repeat CBC at her outpatient appointment to ensure her Hbg remains stable.   Discharge Vitals:   BP 114/59 mmHg  Pulse 65  Temp(Src) 98.9 F (37.2 C) (Oral)  Resp 16  Ht 5\' 2"  (1.575 m)  Wt 64.3 kg (141 lb 12.1 oz)  BMI 25.92 kg/m2  SpO2 96%  LMP 05/13/2013 Physical Exam General: alert, sitting up in bed, NAD HEENT: [place]/AT, EOMI, mucus membranes moist CV: RRR, no m/g/r Pulm:  CTA bilaterally, no wheezing Abd: BS+, soft, non-tender Ext: warm, no edema, moves all Neuro: alert and oriented x 3, no focal deficits  Discharge Labs:  Results for orders placed or performed during the hospital encounter of 09/03/14 (from the past 24 hour(s))  Comprehensive metabolic panel     Status: Abnormal   Collection Time: 09/06/14  6:13 AM  Result Value Ref Range   Sodium 140 135 - 145 mmol/L   Potassium 3.6 3.5 - 5.1 mmol/L   Chloride 106 96 - 112 mmol/L   CO2 28 19 - 32 mmol/L   Glucose, Bld 92 70 - 99 mg/dL   BUN 13 6 - 23 mg/dL   Creatinine, Ser 0.68 0.50 - 1.10  mg/dL   Calcium 8.5 8.4 - 10.5 mg/dL   Total Protein 6.0 6.0 - 8.3 g/dL   Albumin 3.0 (L) 3.5 - 5.2 g/dL   AST 17 0 - 37 U/L   ALT 16 0 - 35 U/L   Alkaline Phosphatase 42 39 - 117 U/L   Total Bilirubin 0.5 0.3 - 1.2 mg/dL   GFR calc non Af Amer >90 >90 mL/min   GFR calc Af Amer >90 >90 mL/min   Anion gap 6 5 - 15  Magnesium     Status: None   Collection Time: 09/06/14  6:13 AM  Result Value Ref Range   Magnesium 1.9 1.5 - 2.5 mg/dL  CBC     Status: Abnormal   Collection Time: 09/06/14  6:13 AM  Result Value Ref Range   WBC 9.9 4.0 - 10.5 K/uL   RBC 4.19 3.87 - 5.11 MIL/uL   Hemoglobin 11.5 (L) 12.0 - 15.0 g/dL   HCT 36.4 36.0 - 46.0 %   MCV 86.9 78.0 - 100.0 fL   MCH 27.4 26.0 - 34.0 pg   MCHC 31.6 30.0 - 36.0 g/dL   RDW 13.6 11.5 - 15.5 %   Platelets 261 150 - 400 K/uL    Signed: Albin Felling, MD 09/06/2014, 3:11 PM    Services Ordered on Discharge: None Equipment Ordered on Discharge: None

## 2014-09-06 NOTE — Care Management Note (Signed)
    Page 1 of 1   09/06/2014     7:06:25 PM CARE MANAGEMENT NOTE 09/06/2014  Patient:  [facility]   Account Number:  [id number]  Date Initiated:  09/06/2014  Documentation initiated by:  Tomi Bamberger  Subjective/Objective Assessment:   dx asthma ex  admit- from home. pta indep.     Action/Plan:   Anticipated DC Date:  09/06/2014   Anticipated DC Plan:  [place]  CM consult  [facility]  Medication Assistance      Choice offered to / List presented to:             Status of service:  Completed, signed off Medicare Important Message given?  NO (If response is "NO", the following Medicare IM given date fields will be blank) Date Medicare IM given:   Medicare IM given by:   Date Additional Medicare IM given:   Additional Medicare IM given by:    Discharge Disposition:  HOME/SELF CARE  Per UR Regulation:  Reviewed for med. necessity/level of care/duration of stay  If discussed at [place] of Stay Meetings, dates discussed:    Comments:  09/06/14 [place] [phone] patient dc today, NCM gave patient brochure for [facility], informed her to call in am at 9 to make her follow up apt, and she is to go there today to get her meds after dc , prednisone and albuterol inhaler.

## 2014-09-06 NOTE — Progress Notes (Signed)
Patient discharge teaching given, including activity, diet, follow-up appoints, and medications. Patient verbalized understanding of all discharge instructions. IV access was d/c'd. Vitals are stable. Skin is intact except as charted in most recent assessments. Pt to be escorted out by NT and to take bus home.

## 2014-09-06 NOTE — Progress Notes (Signed)
Subjective: Patient reports her breathing has improved today. She denies wheezing.   Objective: Vital signs in last 24 hours: Filed Vitals:   09/05/14 2129 09/06/14 0142 09/06/14 0508 09/06/14 0736  BP: 97/54  122/80   Pulse: 81     Temp: 98.6 F (37 C)  97.5 F (36.4 C)   TempSrc: Oral  Oral   Resp: 18  18   Height:      Weight:      SpO2: 95% 96% 98% 97%   Weight change:   Intake/Output Summary (Last 24 hours) at 09/06/14 1227 Last data filed at 09/06/14 0900  Gross per 24 hour  Intake    470 ml  Output    550 ml  Net    -80 ml   Physical Exam General: alert, sitting up in bed, NAD HEENT: [place]/AT, EOMI, mucus membranes moist CV: RRR, no m/g/r Pulm: CTA bilaterally, no wheezing Abd: BS+, soft, non-tender Ext: warm, no edema, moves all Neuro: alert and oriented x 3, no focal deficits  Lab Results: Basic Metabolic Panel:  Recent Labs Lab 09/03/14 1405 09/03/14 2113 09/06/14 0613  NA 139  --  140  K 3.6  --  3.6  CL 105  --  106  CO2 29  --  28  GLUCOSE 87  --  92  BUN 7  --  13  CREATININE 0.67  --  0.68  CALCIUM 8.8  --  8.5  MG  --  1.8 1.9   Liver Function Tests:  Recent Labs Lab 09/06/14 0613  AST 17  ALT 16  ALKPHOS 42  BILITOT 0.5  PROT 6.0  ALBUMIN 3.0*   No results for input(s): LIPASE, AMYLASE in the last 168 hours. No results for input(s): AMMONIA in the last 168 hours. CBC:  Recent Labs Lab 09/04/14 0528 09/06/14 0613  WBC 5.8 9.9  HGB 12.2 11.5*  HCT 38.8 36.4  MCV 87.2 86.9  PLT 273 261   Anemia Panel:  Recent Labs Lab 09/03/14 2113  VITAMINB12 500  FOLATE >20.0  FERRITIN 29  TIBC 338  IRON 26*  RETICCTPCT 1.1   Medications: I have reviewed the patient's current medications. Scheduled Meds: . dextromethorphan-guaiFENesin  1 tablet Oral BID  . enoxaparin (LOVENOX) injection  40 mg Subcutaneous Q24H  . ipratropium-albuterol  3 mL Nebulization Q6H  . mometasone-formoterol  2 puff Inhalation BID  . predniSONE   60 mg Oral Q breakfast   Continuous Infusions:  PRN Meds:.albuterol, benzonatate Assessment/Plan: Active Problems:   Asthma exacerbation  Acute exacerbation of asthma - Patient has improved clinically. No wheezes on exam.  - Discharge with Prednisone 5-day taper and inhalers from hospital. Will make f/u appointment in [facility].  - Continue duoneb treatments Q 4hrs and albuterol inhaler Q6 hr PRN - Continue dulera 2 puffs BID - Continue tessalon 100 mg BID PRN cough and mucinex DM BID productive cough - F/u respiratory panel  - CM consult for PCP follow-up with [facility] and affordability of medications/inhalers   History of Iron-deficiency anemia - Hg stable. - Follow-up CBC as outpatient to ensure CBC remains stable - No indication for iron supplementation due to resolved anemia   Diet: Regular  VTE PPx: Lovenox SQ Dispo: Discharge likely today  The patient does not have a current PCP (No Pcp Per Patient) and does need an [facility] hospital follow-up appointment after discharge.  The patient does not have transportation limitations that hinder transportation to clinic appointments.  .Services Needed at  time of discharge: Y = Yes, Blank = No PT:   OT:   RN:   Equipment:   Other:     LOS: 3 days   Albin Felling, MD 09/06/2014, 12:27 PM

## 2014-09-07 LAB — RESPIRATORY VIRUS PANEL
Adenovirus: NEGATIVE
Influenza A: NEGATIVE
Influenza B: NEGATIVE
Metapneumovirus: NEGATIVE
PARAINFLUENZA 1 A: NEGATIVE
Parainfluenza 2: NEGATIVE
Parainfluenza 3: NEGATIVE
Respiratory Syncytial Virus A: NEGATIVE
Respiratory Syncytial Virus B: NEGATIVE
Rhinovirus: NEGATIVE

## 2014-09-15 ENCOUNTER — Ambulatory Visit: Payer: Self-pay | Attending: Internal Medicine | Admitting: Internal Medicine

## 2014-09-15 ENCOUNTER — Encounter: Payer: Self-pay | Admitting: Internal Medicine

## 2014-09-15 VITALS — BP 100/60 | HR 66 | Temp 97.9°F | Resp 16 | Ht 62.0 in | Wt 144.0 lb

## 2014-09-15 DIAGNOSIS — Z7951 Long term (current) use of inhaled steroids: Secondary | ICD-10-CM | POA: Insufficient documentation

## 2014-09-15 DIAGNOSIS — Z139 Encounter for screening, unspecified: Secondary | ICD-10-CM

## 2014-09-15 DIAGNOSIS — Z833 Family history of diabetes mellitus: Secondary | ICD-10-CM

## 2014-09-15 DIAGNOSIS — J45909 Unspecified asthma, uncomplicated: Secondary | ICD-10-CM

## 2014-09-15 DIAGNOSIS — Z8639 Personal history of other endocrine, nutritional and metabolic disease: Secondary | ICD-10-CM

## 2014-09-15 DIAGNOSIS — J449 Chronic obstructive pulmonary disease, unspecified: Secondary | ICD-10-CM | POA: Insufficient documentation

## 2014-09-15 LAB — CBC WITH DIFFERENTIAL/PLATELET
BASOS PCT: 0 % (ref 0–1)
Basophils Absolute: 0 10*3/uL (ref 0.0–0.1)
Eosinophils Absolute: 0.2 10*3/uL (ref 0.0–0.7)
Eosinophils Relative: 2 % (ref 0–5)
HEMATOCRIT: 41.7 % (ref 36.0–46.0)
Hemoglobin: 13.1 g/dL (ref 12.0–15.0)
Lymphocytes Relative: 26 % (ref 12–46)
Lymphs Abs: 2.1 10*3/uL (ref 0.7–4.0)
MCH: 27.8 pg (ref 26.0–34.0)
MCHC: 31.4 g/dL (ref 30.0–36.0)
MCV: 88.5 fL (ref 78.0–100.0)
MPV: 11.1 fL (ref 8.6–12.4)
Monocytes Absolute: 0.6 10*3/uL (ref 0.1–1.0)
Monocytes Relative: 7 % (ref 3–12)
Neutro Abs: 5.2 10*3/uL (ref 1.7–7.7)
Neutrophils Relative %: 65 % (ref 43–77)
Platelets: 312 10*3/uL (ref 150–400)
RBC: 4.71 MIL/uL (ref 3.87–5.11)
RDW: 13.3 % (ref 11.5–15.5)
WBC: 8 10*3/uL (ref 4.0–10.5)

## 2014-09-15 NOTE — Progress Notes (Signed)
Patient here for hospital f/u for asthma excacerbation Patient does  Not need any med refills  Disposition and follow-up:  [name] was discharged from [facility] in Good condition. At the hospital follow up visit please address:  1. Asthma symptoms- How often has she been needing to use her inhaler? Was she able to get her inhalers/medications?  2. Labs / imaging needed at time of follow-up: CBC- follow anemia   3. Pending labs/ test needing follow-up: Resp virus panel

## 2014-09-15 NOTE — Progress Notes (Signed)
Patient Demographics  [name], is a 34 y.o. female  EUM:[id number]  VQM:[id number]  DOB - July 12, 1981  CC:  Chief Complaint  Patient presents with  . Hospitalization Follow-up  . Asthma       HPI: Sherri Shaw is a 34 y.o. female here today to establish medical care.Patient has history of asthma, recently hospitalized with symptoms of shortness of breath, dyspnea on exertion, prior to that she was having cold symptoms, she was treated for asthma exacerbation, she was given IV Solu-Medrol, DuoNeb, subsequently she improved and was transitioned to oral prednisone taper, was given albuterol inhaler and Dulera inhaler, she also history of iron deficiency anemia, history of fibroids status post hysterectomy done in 2014, currently patient denies any Symptoms has been using Dulera and used albuterol few days ago  . Patient denies smoking cigarettes. Patient has No headache, No chest pain, No abdominal pain - No Nausea, No new weakness tingling or numbness, No Cough - SOB.  No Known Allergies Past Medical History  Diagnosis Date  . Asthma   . Chronic bronchitis     "I get it q yr" (04/07/2013)  . Anemia   . History of blood transfusion 04/07/2013    "got my first transfusion today; got 4 units" (04/07/2013)  . Bipolar disorder     sister denies this hx on 04/07/2013  . Schizophrenia     "don't really know the type"/sister (04/07/2013)  . Microcytic anemia     Archie Endo 04/07/2013  . Exertional shortness of breath     ? due to anemia   Current Outpatient Prescriptions on File Prior to Visit  Medication Sig Dispense Refill  . albuterol (PROVENTIL HFA;VENTOLIN HFA) 108 (90 BASE) MCG/ACT inhaler Inhale 2 puffs into the lungs every 6 (six) hours as needed for wheezing or shortness of breath. 1 Inhaler 1  . mometasone-formoterol (DULERA) 100-5 MCG/ACT AERO Inhale 2 puffs into the lungs 2 (two) times daily. 1 Inhaler 1  . predniSONE (DELTASONE) 20 MG tablet Take 3 tablets (60 mg total) by  mouth daily with breakfast. (Patient not taking: Reported on 09/15/2014) 5 tablet 0   No current facility-administered medications on file prior to visit.   Family History  Problem Relation Age of Onset  . Hypertension Mother   . Diabetes Mother   . Asthma Mother   . Stroke Mother   . Cancer Mother   . Asthma Brother   . Heart disease Maternal Aunt    History   Social History  . Marital Status: Single    Spouse Name: N/A  . Number of Children: N/A  . Years of Education: Coll   Occupational History  . Not on file.   Social History Main Topics  . Smoking status: Never Smoker   . Smokeless tobacco: Never Used  . Alcohol Use: No  . Drug Use: No  . Sexual Activity: Not Currently   Other Topics Concern  . 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 HENT: Negative for ear pain, nosebleeds, congestion, facial swelling, rhinorrhea, neck pain, neck stiffness and ear discharge.  Eyes: Negative for pain, discharge, redness, itching and visual disturbance. Respiratory: Negative for cough, choking, chest tightness, shortness of breath, wheezing and stridor.  Cardiovascular: Negative for chest pain, palpitations and leg swelling. Gastrointestinal: Negative  for abdominal distention. Genitourinary: Negative for dysuria, urgency, frequency, hematuria, flank pain, decreased urine volume, difficulty urinating and dyspareunia.  Musculoskeletal: Negative for back pain, joint swelling, arthralgia and gait problem. Neurological: Negative for dizziness, tremors, seizures, syncope, facial asymmetry, speech difficulty, weakness, light-headedness, numbness and headaches.  Hematological: Negative for adenopathy. Does not bruise/bleed easily. Psychiatric/Behavioral: Negative for  hallucinations, behavioral problems, confusion, dysphoric mood, decreased concentration and agitation.    Objective:   Filed Vitals:   09/15/14 1412  BP: 100/60  Pulse:   Temp:   Resp:     Physical Exam: Constitutional: Patient appears well-developed and well-nourished. No distress. HENT: Normocephalic, atraumatic, External right and left ear normal. Oropharynx is clear and moist.  Eyes: Conjunctivae and EOM are normal. PERRLA, no scleral icterus. Neck: Normal ROM. Neck supple. No JVD. No tracheal deviation. No thyromegaly. CVS: RRR, S1/S2 +, no murmurs, no gallops, no carotid bruit.  Pulmonary: Effort and breath sounds normal, no stridor, rhonchi, wheezes, rales.  Abdominal: Soft. BS +, no distension, tenderness, rebound or guarding.  Musculoskeletal: Normal range of motion. No edema and no tenderness.  Neuro: Alert. Normal reflexes, muscle tone coordination. No cranial nerve deficit. Skin: Skin is warm and dry. No rash noted. Not diaphoretic. No erythema. No pallor. Psychiatric: Normal mood and affect. Behavior, judgment, thought content normal.  Lab Results  Component Value Date   WBC 9.9 09/06/2014   HGB 11.5* 09/06/2014   HCT 36.4 09/06/2014   MCV 86.9 09/06/2014   PLT 261 09/06/2014   Lab Results  Component Value Date   CREATININE 0.68 09/06/2014   BUN 13 09/06/2014   NA 140 09/06/2014   K 3.6 09/06/2014   CL 106 09/06/2014   CO2 28 09/06/2014    No results found for: HGBA1C Lipid Panel  No results found for: CHOL, TRIG, HDL, CHOLHDL, VLDL, LDLCALC     Assessment and plan:   1. Asthma, chronic, unspecified asthma severity, uncomplicated Symptoms are improved continue with dulera and albuterol when necessary  2. Family history of diabetes mellitus (DM)  - Hemoglobin A1c  3. History of iron deficiency  - CBC with Differential/Platelet  4. Screening  - Vit D  25 hydroxy (rtn osteoporosis monitoring) - TSH  Health  Maintenance  -Vaccinations:  uptodate with flu shot and pneumovax    Return in about 3 months (around 12/14/2014), or if symptoms worsen or fail to improve, for asthma.    Lorayne Marek, MD

## 2014-09-15 NOTE — Patient Instructions (Signed)

## 2014-09-16 ENCOUNTER — Telehealth: Payer: Self-pay

## 2014-09-16 LAB — HEMOGLOBIN A1C
HEMOGLOBIN A1C: 5.1 % (ref <5.7)
Mean Plasma Glucose: 100 mg/dL (ref <117)

## 2014-09-16 LAB — VITAMIN D 25 HYDROXY (VIT D DEFICIENCY, FRACTURES): Vit D, 25-Hydroxy: 5 ng/mL — ABNORMAL LOW (ref 30–100)

## 2014-09-16 LAB — TSH: TSH: 0.671 u[IU]/mL (ref 0.350–4.500)

## 2014-09-16 MED ORDER — VITAMIN D (ERGOCALCIFEROL) 1.25 MG (50000 UNIT) PO CAPS: 50000.0000 [IU] | ORAL_CAPSULE | ORAL | Status: DC

## 2014-09-16 NOTE — Telephone Encounter (Signed)
-----   Message from Lorayne Marek, MD sent at 09/16/2014  9:26 AM EST ----- Blood work reviewed, noticed low vitamin D, call patient advise to start ergocalciferol 50,000 units once a week for the duration of  12 weeks.

## 2014-09-16 NOTE — Telephone Encounter (Signed)
Patient is aware of her lab results Prescription sent to [facility]

## 2014-09-20 ENCOUNTER — Ambulatory Visit: Payer: Self-pay | Admitting: Internal Medicine

## 2015-06-30 ENCOUNTER — Encounter (HOSPITAL_COMMUNITY): Payer: Self-pay | Admitting: *Deleted

## 2015-06-30 ENCOUNTER — Emergency Department (HOSPITAL_COMMUNITY)
Admission: EM | Admit: 2015-06-30 | Discharge: 2015-06-30 | Disposition: A | Discharge status: Home / Self Care | Payer: Medicaid Other | Attending: Emergency Medicine | Admitting: Emergency Medicine

## 2015-06-30 DIAGNOSIS — Z862 Personal history of diseases of the blood and blood-forming organs and certain disorders involving the immune mechanism: Secondary | ICD-10-CM | POA: Diagnosis not present

## 2015-06-30 DIAGNOSIS — Z8659 Personal history of other mental and behavioral disorders: Secondary | ICD-10-CM | POA: Diagnosis not present

## 2015-06-30 DIAGNOSIS — Z79899 Other long term (current) drug therapy: Secondary | ICD-10-CM | POA: Diagnosis not present

## 2015-06-30 DIAGNOSIS — J45901 Unspecified asthma with (acute) exacerbation: Secondary | ICD-10-CM | POA: Insufficient documentation

## 2015-06-30 DIAGNOSIS — J45909 Unspecified asthma, uncomplicated: Secondary | ICD-10-CM | POA: Diagnosis present

## 2015-06-30 MED ORDER — PREDNISONE 10 MG PO TABS: 20.0000 mg | ORAL_TABLET | Freq: Two times a day (BID) | ORAL | Status: DC

## 2015-06-30 MED ORDER — ALBUTEROL SULFATE (2.5 MG/3ML) 0.083% IN NEBU
5.0000 mg | INHALATION_SOLUTION | Freq: Once | RESPIRATORY_TRACT | Status: AC
  Administered 2015-06-30: 5 mg via RESPIRATORY_TRACT

## 2015-06-30 MED ORDER — ALBUTEROL SULFATE (2.5 MG/3ML) 0.083% IN NEBU
5.0000 mg | INHALATION_SOLUTION | Freq: Once | RESPIRATORY_TRACT | Status: AC
  Administered 2015-06-30: 5 mg via RESPIRATORY_TRACT
  Filled 2015-06-30: qty 6

## 2015-06-30 MED ORDER — PREDNISONE 20 MG PO TABS
20.0000 mg | ORAL_TABLET | Freq: Once | ORAL | Status: AC
  Administered 2015-06-30: 20 mg via ORAL
  Filled 2015-06-30: qty 1

## 2015-06-30 MED ORDER — ALBUTEROL SULFATE HFA 108 (90 BASE) MCG/ACT IN AERS
2.0000 | INHALATION_SPRAY | RESPIRATORY_TRACT | Status: DC | PRN
  Administered 2015-06-30: 2 via RESPIRATORY_TRACT
  Filled 2015-06-30: qty 6.7

## 2015-06-30 MED ORDER — ALBUTEROL SULFATE (2.5 MG/3ML) 0.083% IN NEBU
INHALATION_SOLUTION | RESPIRATORY_TRACT | Status: AC
  Filled 2015-06-30: qty 6

## 2015-06-30 NOTE — Discharge Instructions (Signed)
Prednisone as prescribed.  Albuterol inhaler: 2 puffs every 4 hours as needed for wheezing.  Return to the emergency department if you develop chest pain, worsening breathing, or other new and concerning symptoms.   Asthma, Adult Asthma is a recurring condition in which the airways tighten and narrow. Asthma can make it difficult to breathe. It can cause coughing, wheezing, and shortness of breath. Asthma episodes, also called asthma attacks, range from minor to life-threatening. Asthma cannot be cured, but medicines and lifestyle changes can help control it. CAUSES Asthma is believed to be caused by inherited (genetic) and environmental factors, but its exact cause is unknown. Asthma may be triggered by allergens, lung infections, or irritants in the air. Asthma triggers are different for each person. Common triggers include:   Animal dander.  Dust mites.  Cockroaches.  Pollen from trees or grass.  Mold.  Smoke.  Air pollutants such as dust, household cleaners, hair sprays, aerosol sprays, paint fumes, strong chemicals, or strong odors.  Cold air, weather changes, and winds (which increase molds and pollens in the air).  Strong emotional expressions such as crying or laughing hard.  Stress.  Certain medicines (such as aspirin) or types of drugs (such as beta-blockers).  Sulfites in foods and drinks. Foods and drinks that may contain sulfites include dried fruit, potato chips, and sparkling grape juice.  Infections or inflammatory conditions such as the flu, a cold, or an inflammation of the nasal membranes (rhinitis).  Gastroesophageal reflux disease (GERD).  Exercise or strenuous activity. SYMPTOMS Symptoms may occur immediately after asthma is triggered or many hours later. Symptoms include:  Wheezing.  Excessive nighttime or early morning coughing.  Frequent or severe coughing with a common cold.  Chest tightness.  Shortness of breath. DIAGNOSIS  The  diagnosis of asthma is made by a review of your medical history and a physical exam. Tests may also be performed. These may include:  Lung function studies. These tests show how much air you breathe in and out.  Allergy tests.  Imaging tests such as X-rays. TREATMENT  Asthma cannot be cured, but it can usually be controlled. Treatment involves identifying and avoiding your asthma triggers. It also involves medicines. There are 2 classes of medicine used for asthma treatment:   Controller medicines. These prevent asthma symptoms from occurring. They are usually taken every day.  Reliever or rescue medicines. These quickly relieve asthma symptoms. They are used as needed and provide short-term relief. Your health care provider will help you create an asthma action plan. An asthma action plan is a written plan for managing and treating your asthma attacks. It includes a list of your asthma triggers and how they may be avoided. It also includes information on when medicines should be taken and when their dosage should be changed. An action plan may also involve the use of a device called a peak flow meter. A peak flow meter measures how well the lungs are working. It helps you monitor your condition. HOME CARE INSTRUCTIONS   Take medicines only as directed by your health care provider. Speak with your health care provider if you have questions about how or when to take the medicines.  Use a peak flow meter as directed by your health care provider. Record and keep track of readings.  Understand and use the action plan to help minimize or stop an asthma attack without needing to seek medical care.  Control your home environment in the following ways to help prevent asthma attacks:  Do not smoke. Avoid being exposed to secondhand smoke.  Change your heating and air conditioning filter regularly.  Limit your use of fireplaces and wood stoves.  Get rid of pests (such as roaches and mice) and  their droppings.  Throw away plants if you see mold on them.  Clean your floors and dust regularly. Use unscented cleaning products.  Try to have someone else vacuum for you regularly. Stay out of rooms while they are being vacuumed and for a short while afterward. If you vacuum, use a dust mask from a hardware store, a double-layered or microfilter vacuum cleaner bag, or a vacuum cleaner with a HEPA filter.  Replace carpet with wood, tile, or vinyl flooring. Carpet can trap dander and dust.  Use allergy-proof pillows, mattress covers, and box spring covers.  Wash bed sheets and blankets every week in hot water and dry them in a dryer.  Use blankets that are made of polyester or cotton.  Clean bathrooms and kitchens with bleach. If possible, have someone repaint the walls in these rooms with mold-resistant paint. Keep out of the rooms that are being cleaned and painted.  Wash hands frequently. SEEK MEDICAL CARE IF:   You have wheezing, shortness of breath, or a cough even if taking medicine to prevent attacks.  The colored mucus you cough up (sputum) is thicker than usual.  Your sputum changes from clear or white to yellow, green, gray, or bloody.  You have any problems that may be related to the medicines you are taking (such as a rash, itching, swelling, or trouble breathing).  You are using a reliever medicine more than 2-3 times per week.  Your peak flow is still at 50-79% of your personal best after following your action plan for 1 hour.  You have a fever. SEEK IMMEDIATE MEDICAL CARE IF:   You seem to be getting worse and are unresponsive to treatment during an asthma attack.  You are short of breath even at rest.  You get short of breath when doing very little physical activity.  You have difficulty eating, drinking, or talking due to asthma symptoms.  You develop chest pain.  You develop a fast heartbeat.  You have a bluish color to your lips or  fingernails.  You are light-headed, dizzy, or faint.  Your peak flow is less than 50% of your personal best.   This information is not intended to replace advice given to you by your health care provider. Make sure you discuss any questions you have with your health care provider.   Document Released: 07/23/2005 Document Revised: 04/13/2015 Document Reviewed: 02/19/2013 Elsevier Interactive Patient Education [employer].

## 2015-06-30 NOTE — ED Notes (Signed)
Pt. Given an inhaler to take home.  She verbalized understanding of use.  Pt. Has hx of asthma

## 2015-06-30 NOTE — ED Provider Notes (Signed)
CSN: [id number]     Arrival date & time 06/30/15  1535 History   First MD Initiated Contact with Patient 06/30/15 1708     Chief Complaint  Patient presents with  . Asthma     (Consider location/radiation/quality/duration/timing/severity/associated sxs/prior Treatment) HPI Comments: Patient is 34 year old female with history of asthma, bipolar, schizophrenia. She presents for evaluation of shortness of breath and wheezing that started this morning. She denies any productive cough, fever, or chest pain. She is out of her inhaler.  Patient is a 34 y.o. female presenting with asthma. The history is provided by the patient.  Asthma This is a recurrent problem. The current episode started yesterday. The problem occurs constantly. The problem has been gradually worsening. Associated symptoms include shortness of breath. Pertinent negatives include no chest pain. Nothing aggravates the symptoms. Nothing relieves the symptoms. She has tried nothing for the symptoms. The treatment provided no relief.    Past Medical History  Diagnosis Date  . Asthma   . Chronic bronchitis ([place])     "I get it q yr" (04/07/2013)  . Anemia   . History of blood transfusion 04/07/2013    "got my first transfusion today; got 4 units" (04/07/2013)  . Bipolar disorder [facility])     sister denies this hx on 04/07/2013  . Schizophrenia ([employer])     "don't really know the type"/sister (04/07/2013)  . Microcytic anemia     Archie Endo 04/07/2013  . Exertional shortness of breath     ? due to anemia   Past Surgical History  Procedure Laterality Date  . No past surgeries    . Abdominal hysterectomy N/A 06/01/2013    Procedure: HYSTERECTOMY ABDOMINAL;  Surgeon: Lavonia Drafts, MD;  Location: [facility];  Service: Gynecology;  Laterality: N/A;  . Bilateral salpingectomy Bilateral 06/01/2013    Procedure: BILATERAL SALPINGECTOMY;  Surgeon: Lavonia Drafts, MD;  Location: [facility];  Service: Gynecology;  Laterality: Bilateral;  .  Cystoscopy w/ ureteral stent placement Bilateral 06/01/2013    Procedure: CYSTOSCOPY WITH BILATERAL STENT REPLACEMENT;  Surgeon: Irine Seal, MD;  Location: [facility];  Service: Urology;  Laterality: Bilateral;   Family History  Problem Relation Age of Onset  . Hypertension Mother   . Diabetes Mother   . Asthma Mother   . Stroke Mother   . Cancer Mother   . Asthma Brother   . Heart disease Maternal Aunt    Social History  Substance Use Topics  . Smoking status: Never Smoker   . Smokeless tobacco: Never Used  . Alcohol Use: No   OB History    Gravida Para Term Preterm AB TAB SAB Ectopic Multiple Living   0 0 0 0 0 0 0 0 0 0      Review of Systems  Respiratory: Positive for shortness of breath.   Cardiovascular: Negative for chest pain.  All other systems reviewed and are negative.     Allergies  Review of patient's allergies indicates no known allergies.  Home Medications   Prior to Admission medications   Medication Sig Start Date End Date Taking? Authorizing Provider  albuterol (PROVENTIL HFA;VENTOLIN HFA) 108 (90 BASE) MCG/ACT inhaler Inhale 2 puffs into the lungs every 6 (six) hours as needed for wheezing or shortness of breath. 09/06/14   Juliet Rude, MD  mometasone-formoterol (DULERA) 100-5 MCG/ACT AERO Inhale 2 puffs into the lungs 2 (two) times daily. 09/06/14   Juliet Rude, MD  predniSONE (DELTASONE) 20 MG tablet Take 3 tablets (60 mg total)  by mouth daily with breakfast. Patient not taking: Reported on 09/15/2014 09/06/14   Juliet Rude, MD  Vitamin D, Ergocalciferol, (DRISDOL) 50000 UNITS CAPS capsule Take 1 capsule (50,000 Units total) by mouth every 7 (seven) days. 09/16/14   Lorayne Marek, MD   BP 143/93 mmHg  Pulse 93  Temp(Src) 98 F (36.7 C) (Oral)  Resp 16  Ht 5\' 2"  (1.575 m)  Wt 111 lb 1.6 oz (50.395 kg)  BMI 20.32 kg/m2  SpO2 96%  LMP 05/13/2013 Physical Exam  Constitutional: She is oriented to person, place, and time. She appears well-developed and  well-nourished. No distress.  HENT:  Head: Normocephalic and atraumatic.  Neck: Normal range of motion. Neck supple.  Cardiovascular: Normal rate and regular rhythm.  Exam reveals no gallop and no friction rub.   No murmur heard. Pulmonary/Chest: Effort normal. No respiratory distress. She has wheezes. She has no rales.  There are expiratory rhonchi bilaterally.  Abdominal: Soft. Bowel sounds are normal. She exhibits no distension. There is no tenderness.  Musculoskeletal: Normal range of motion.  Neurological: She is alert and oriented to person, place, and time.  Skin: Skin is warm and dry. She is not diaphoretic.  Nursing note and vitals reviewed.   ED Course  Procedures (including critical care time) Labs Review Labs Reviewed - No data to display  Imaging Review No results found. I have personally reviewed and evaluated these images and lab results as part of my medical decision-making.   EKG Interpretation None      MDM   Final diagnoses:  None    She will be given 2 nebulizer treatments, dose of prednisone, and discharged with an MDI and prednisone.  She is in no respiratory distress, oxygen saturations are 100% while I'm in the room, and she does not appear toxic or acutely ill.    Veryl Speak, MD 06/30/15 [phone]

## 2015-06-30 NOTE — ED Notes (Signed)
Pt states that she has asthma and is having a flare up today. States that she ran out of her inhaler. Audible wheezing.

## 2015-10-06 ENCOUNTER — Inpatient Hospital Stay (HOSPITAL_COMMUNITY)
Admission: EM | Admit: 2015-10-06 | Discharge: 2015-10-07 | DRG: 203 | Disposition: A | Discharge status: Home Health | Payer: Medicaid Other | Attending: Family Medicine | Admitting: Family Medicine

## 2015-10-06 ENCOUNTER — Encounter (HOSPITAL_COMMUNITY): Payer: Self-pay | Admitting: Emergency Medicine

## 2015-10-06 ENCOUNTER — Emergency Department (HOSPITAL_COMMUNITY): Payer: Medicaid Other

## 2015-10-06 DIAGNOSIS — F250 Schizoaffective disorder, bipolar type: Secondary | ICD-10-CM

## 2015-10-06 DIAGNOSIS — R0902 Hypoxemia: Secondary | ICD-10-CM | POA: Diagnosis present

## 2015-10-06 DIAGNOSIS — J45901 Unspecified asthma with (acute) exacerbation: Secondary | ICD-10-CM | POA: Diagnosis present

## 2015-10-06 DIAGNOSIS — Z8249 Family history of ischemic heart disease and other diseases of the circulatory system: Secondary | ICD-10-CM

## 2015-10-06 DIAGNOSIS — Z825 Family history of asthma and other chronic lower respiratory diseases: Secondary | ICD-10-CM

## 2015-10-06 DIAGNOSIS — F259 Schizoaffective disorder, unspecified: Secondary | ICD-10-CM | POA: Diagnosis present

## 2015-10-06 DIAGNOSIS — Z7951 Long term (current) use of inhaled steroids: Secondary | ICD-10-CM

## 2015-10-06 DIAGNOSIS — Z79899 Other long term (current) drug therapy: Secondary | ICD-10-CM

## 2015-10-06 DIAGNOSIS — Z833 Family history of diabetes mellitus: Secondary | ICD-10-CM

## 2015-10-06 DIAGNOSIS — Z823 Family history of stroke: Secondary | ICD-10-CM

## 2015-10-06 DIAGNOSIS — J4541 Moderate persistent asthma with (acute) exacerbation: Principal | ICD-10-CM | POA: Diagnosis present

## 2015-10-06 LAB — CBC WITH DIFFERENTIAL/PLATELET
BASOS ABS: 0 10*3/uL (ref 0.0–0.1)
Basophils Relative: 0 %
EOS ABS: 0.1 10*3/uL (ref 0.0–0.7)
Eosinophils Relative: 0 %
HCT: 40.3 % (ref 36.0–46.0)
HEMOGLOBIN: 12.6 g/dL (ref 12.0–15.0)
LYMPHS ABS: 0.9 10*3/uL (ref 0.7–4.0)
Lymphocytes Relative: 6 %
MCH: 28 pg (ref 26.0–34.0)
MCHC: 31.3 g/dL (ref 30.0–36.0)
MCV: 89.6 fL (ref 78.0–100.0)
Monocytes Absolute: 0.5 10*3/uL (ref 0.1–1.0)
Monocytes Relative: 3 %
Neutro Abs: 12.6 10*3/uL — ABNORMAL HIGH (ref 1.7–7.7)
Neutrophils Relative %: 91 %
Platelets: 220 10*3/uL (ref 150–400)
RBC: 4.5 MIL/uL (ref 3.87–5.11)
RDW: 12.6 % (ref 11.5–15.5)
WBC: 14.1 10*3/uL — AB (ref 4.0–10.5)

## 2015-10-06 MED ORDER — ALBUTEROL SULFATE (2.5 MG/3ML) 0.083% IN NEBU
INHALATION_SOLUTION | RESPIRATORY_TRACT | Status: AC
  Administered 2015-10-06: 5 mg
  Filled 2015-10-06: qty 6

## 2015-10-06 MED ORDER — ALBUTEROL (5 MG/ML) CONTINUOUS INHALATION SOLN
10.0000 mg/h | INHALATION_SOLUTION | Freq: Once | RESPIRATORY_TRACT | Status: AC
  Administered 2015-10-07: 10 mg/h via RESPIRATORY_TRACT
  Filled 2015-10-06: qty 20

## 2015-10-06 MED ORDER — METHYLPREDNISOLONE SODIUM SUCC 125 MG IJ SOLR
125.0000 mg | Freq: Once | INTRAMUSCULAR | Status: AC
  Administered 2015-10-07: 125 mg via INTRAVENOUS
  Filled 2015-10-06: qty 2

## 2015-10-06 NOTE — ED Provider Notes (Signed)
CSN: [id number]     Arrival date & time 10/06/15  2159 History  By signing my name below, I, Evelene Croon, attest that this documentation has been prepared under the direction and in the presence of Elnora Morrison, MD . Electronically Signed: Evelene Croon, Scribe. 10/06/2015. 11:36 PM.    Chief Complaint  Patient presents with  . Asthma     The history is provided by the patient. No language interpreter was used.    HPI Comments:  Sherri Shaw is a 35 y.o. female with a history of asthma, who presents to the Emergency Department complaining of persistent SOB since ~ 1830 this evening. She reports associated "wet" cough.  She notes her symptoms today are similar to past asthma exacerbations.She also notes her asthma is often triggered by change in seasons. Pt states she ran out of her inhaler. She denies use of home O2, smoking history, h/o CHF, DM, DVT/PE and recent surgery. Pt also denies CP, LE swelling, fever, and chills. No alleviating factors noted.  Past Medical History  Diagnosis Date  . Asthma   . Chronic bronchitis ([place])     "I get it q yr" (04/07/2013)  . Anemia   . History of blood transfusion 04/07/2013    "got my first transfusion today; got 4 units" (04/07/2013)  . Bipolar disorder [facility])     sister denies this hx on 04/07/2013  . Schizophrenia ([place])     "don't really know the type"/sister (04/07/2013)  . Microcytic anemia     Archie Endo 04/07/2013  . Exertional shortness of breath     ? due to anemia   Past Surgical History  Procedure Laterality Date  . No past surgeries    . Abdominal hysterectomy N/A 06/01/2013    Procedure: HYSTERECTOMY ABDOMINAL;  Surgeon: Lavonia Drafts, MD;  Location: [facility];  Service: Gynecology;  Laterality: N/A;  . Bilateral salpingectomy Bilateral 06/01/2013    Procedure: BILATERAL SALPINGECTOMY;  Surgeon: Lavonia Drafts, MD;  Location: [facility];  Service: Gynecology;  Laterality: Bilateral;  . Cystoscopy w/ ureteral stent placement  Bilateral 06/01/2013    Procedure: CYSTOSCOPY WITH BILATERAL STENT REPLACEMENT;  Surgeon: Irine Seal, MD;  Location: [facility];  Service: Urology;  Laterality: Bilateral;   Family History  Problem Relation Age of Onset  . Hypertension Mother   . Diabetes Mother   . Asthma Mother   . Stroke Mother   . Cancer Mother   . Asthma Brother   . Heart disease Maternal Aunt    Social History  Substance Use Topics  . Smoking status: Never Smoker   . Smokeless tobacco: Never Used  . Alcohol Use: No   OB History    Gravida Para Term Preterm AB TAB SAB Ectopic Multiple Living   0 0 0 0 0 0 0 0 0 0      Review of Systems  Constitutional: Negative for fever and chills.  Respiratory: Positive for cough and shortness of breath.   Cardiovascular: Negative for chest pain and leg swelling.  All other systems reviewed and are negative.  Allergies  Review of patient's allergies indicates no known allergies.  Home Medications   Prior to Admission medications   Medication Sig Start Date End Date Taking? Authorizing Provider  albuterol (PROVENTIL HFA;VENTOLIN HFA) 108 (90 BASE) MCG/ACT inhaler Inhale 2 puffs into the lungs every 6 (six) hours as needed for wheezing or shortness of breath. 09/06/14  Yes Carly Montey Hora, MD  guaiFENesin (ROBITUSSIN) 100 MG/5ML SOLN Take 10 mLs by  mouth every 4 (four) hours as needed for cough or to loosen phlegm.   Yes Historical Provider, MD   BP 117/74 mmHg  Pulse 98  Temp(Src) 97.6 F (36.4 C) (Oral)  Resp 16  Ht 5\' 2"  (1.575 m)  Wt 136 lb (61.689 kg)  BMI 24.87 kg/m2  SpO2 98%  LMP 05/13/2013 Physical Exam  Constitutional: She is oriented to person, place, and time. She appears well-developed.  HENT:  Head: Normocephalic.  Eyes: Conjunctivae and EOM are normal. No scleral icterus.  Neck: Neck supple. No thyromegaly present.  Cardiovascular: Normal rate and regular rhythm.  Exam reveals no gallop and no friction rub.   No murmur heard. Pulmonary/Chest: No  stridor. She has wheezes. She has no rales. She exhibits no tenderness.  Inspiratory and expiratory wheezes bilaterally  Abdominal: Soft. She exhibits no distension. There is no tenderness. There is no rebound.  Musculoskeletal: Normal range of motion. She exhibits no edema.  Lymphadenopathy:    She has no cervical adenopathy.  Neurological: She is oriented to person, place, and time. She exhibits normal muscle tone. Coordination normal.  Skin: No rash noted. No erythema.  Psychiatric: She has a normal mood and affect. Her behavior is normal.  Nursing note and vitals reviewed.   ED Course  Procedures   DIAGNOSTIC STUDIES:  Oxygen Saturation is 97% on 2L [place], normal by my interpretation.    COORDINATION OF CARE:  11:35 PM Will order CXR, breathing treatment, and solumedrol.  Discussed treatment plan with pt at bedside and pt agreed to plan.  Labs Review Labs Reviewed  CBC WITH DIFFERENTIAL/PLATELET - Abnormal; Notable for the following:    WBC 14.1 (*)    Neutro Abs 12.6 (*)    All other components within normal limits  BASIC METABOLIC PANEL - Abnormal; Notable for the following:    Glucose, Bld 125 (*)    All other components within normal limits    Imaging Review Dg Chest 2 View  10/07/2015  CLINICAL DATA:  35 year old female with shortness of breath EXAM: CHEST  2 VIEW COMPARISON:  Radiograph dated 09/04/2014 FINDINGS: The heart size and mediastinal contours are within normal limits. Both lungs are clear. The visualized skeletal structures are unremarkable. IMPRESSION: No active cardiopulmonary disease. Electronically Signed   By: Anner Crete M.D.   On: 10/07/2015 00:27   I have personally reviewed and evaluated these images and lab results as part of my medical decision-making.    MDM   Final diagnoses:  Acute asthma exacerbation, moderate persistent  Hypoxia   Patient presents with clinical concern for asthma exacerbation. Patient hypoxic on arrival. Patient has  no classic blood clot risk factors. Patient has received a blood transfusion before CBC and basic blood work pending. Plan for walking pulse ox, continuous neb and reassessment.  On reassessment as patient is finishing continuous nab she still has significant wheezing. With initial hypoxia and patient still symptomatic plan for observation in the hospital. Chest x-ray reviewed no acute findings. Blood work overall unremarkable. Paged hospitalist.  The patients results and plan were reviewed and discussed.   Any x-rays performed were independently reviewed by myself.   Differential diagnosis were considered with the presenting HPI.  Medications  albuterol (PROVENTIL) (2.5 MG/3ML) 0.083% nebulizer solution (5 mg  Given 10/06/15 2211)  methylPREDNISolone sodium succinate (SOLU-MEDROL) 125 mg/2 mL injection 125 mg (125 mg Intravenous Given 10/07/15 0007)  albuterol (PROVENTIL,VENTOLIN) solution continuous neb (10 mg/hr Nebulization Given 10/07/15 0010)    Filed Vitals:  10/07/15 0100 10/07/15 0130 10/07/15 0200 10/07/15 0230  BP: 98/57 104/61 100/62 117/74  Pulse: 94 93 92 98  Temp:      TempSrc:      Resp: 20 18 16 16   Height:      Weight:      SpO2: 98% 98% 99% 98%    Final diagnoses:  Acute asthma exacerbation, moderate persistent  Hypoxia    Admission/ observation were discussed with the admitting physician, patient and/or family and they are comfortable with the plan.    Elnora Morrison, MD 10/09/15 [phone]

## 2015-10-06 NOTE — ED Notes (Signed)
Pt. reports worsening asthma with wheezing and productive cough onset this evening , pt. stated she ran out of her inhaler , denies fever or chills.

## 2015-10-07 ENCOUNTER — Encounter (HOSPITAL_COMMUNITY): Payer: Self-pay | Admitting: *Deleted

## 2015-10-07 DIAGNOSIS — Z79899 Other long term (current) drug therapy: Secondary | ICD-10-CM | POA: Diagnosis not present

## 2015-10-07 DIAGNOSIS — F259 Schizoaffective disorder, unspecified: Secondary | ICD-10-CM | POA: Diagnosis present

## 2015-10-07 DIAGNOSIS — Z823 Family history of stroke: Secondary | ICD-10-CM | POA: Diagnosis not present

## 2015-10-07 DIAGNOSIS — Z825 Family history of asthma and other chronic lower respiratory diseases: Secondary | ICD-10-CM | POA: Diagnosis not present

## 2015-10-07 DIAGNOSIS — Z833 Family history of diabetes mellitus: Secondary | ICD-10-CM | POA: Diagnosis not present

## 2015-10-07 DIAGNOSIS — J4541 Moderate persistent asthma with (acute) exacerbation: Secondary | ICD-10-CM | POA: Diagnosis present

## 2015-10-07 DIAGNOSIS — R0902 Hypoxemia: Secondary | ICD-10-CM | POA: Diagnosis present

## 2015-10-07 DIAGNOSIS — J45901 Unspecified asthma with (acute) exacerbation: Secondary | ICD-10-CM

## 2015-10-07 DIAGNOSIS — Z8249 Family history of ischemic heart disease and other diseases of the circulatory system: Secondary | ICD-10-CM | POA: Diagnosis not present

## 2015-10-07 DIAGNOSIS — Z7951 Long term (current) use of inhaled steroids: Secondary | ICD-10-CM | POA: Diagnosis not present

## 2015-10-07 LAB — BASIC METABOLIC PANEL
Anion gap: 10 (ref 5–15)
Anion gap: 13 (ref 5–15)
BUN: 10 mg/dL (ref 6–20)
BUN: 8 mg/dL (ref 6–20)
CHLORIDE: 104 mmol/L (ref 101–111)
CHLORIDE: 105 mmol/L (ref 101–111)
CO2: 19 mmol/L — AB (ref 22–32)
CO2: 24 mmol/L (ref 22–32)
CREATININE: 0.78 mg/dL (ref 0.44–1.00)
Calcium: 8.8 mg/dL — ABNORMAL LOW (ref 8.9–10.3)
Calcium: 8.9 mg/dL (ref 8.9–10.3)
Creatinine, Ser: 0.71 mg/dL (ref 0.44–1.00)
GFR calc Af Amer: >60 mL/min (ref >60)
GFR calc Af Amer: >60 mL/min (ref >60)
GFR calc non Af Amer: >60 mL/min (ref >60)
GFR calc non Af Amer: >60 mL/min (ref >60)
Glucose, Bld: 125 mg/dL — ABNORMAL HIGH (ref 65–99)
Glucose, Bld: 152 mg/dL — ABNORMAL HIGH (ref 65–99)
POTASSIUM: 3.7 mmol/L (ref 3.5–5.1)
Potassium: 3.5 mmol/L (ref 3.5–5.1)
SODIUM: 139 mmol/L (ref 135–145)
Sodium: 136 mmol/L (ref 135–145)

## 2015-10-07 LAB — CBC
HEMATOCRIT: 37.2 % (ref 36.0–46.0)
HEMOGLOBIN: 11.6 g/dL — AB (ref 12.0–15.0)
MCH: 27.6 pg (ref 26.0–34.0)
MCHC: 31.2 g/dL (ref 30.0–36.0)
MCV: 88.4 fL (ref 78.0–100.0)
Platelets: 237 10*3/uL (ref 150–400)
RBC: 4.21 MIL/uL (ref 3.87–5.11)
RDW: 12.8 % (ref 11.5–15.5)
WBC: 9.9 10*3/uL (ref 4.0–10.5)

## 2015-10-07 MED ORDER — PREDNISONE 50 MG PO TABS: ORAL_TABLET | ORAL | Status: DC

## 2015-10-07 MED ORDER — PROMETHAZINE HCL 25 MG PO TABS: 12.5000 mg | ORAL_TABLET | Freq: Four times a day (QID) | ORAL | Status: DC | PRN

## 2015-10-07 MED ORDER — ACETAMINOPHEN 650 MG RE SUPP: 650.0000 mg | Freq: Four times a day (QID) | RECTAL | Status: DC | PRN

## 2015-10-07 MED ORDER — GUAIFENESIN 100 MG/5ML PO SOLN: 10.0000 mL | ORAL | Status: DC | PRN

## 2015-10-07 MED ORDER — IPRATROPIUM-ALBUTEROL 0.5-2.5 (3) MG/3ML IN SOLN: 3.0000 mL | RESPIRATORY_TRACT | Status: DC | PRN

## 2015-10-07 MED ORDER — ENOXAPARIN SODIUM 40 MG/0.4ML ~~LOC~~ SOLN
40.0000 mg | Freq: Every day | SUBCUTANEOUS | Status: DC
  Administered 2015-10-07: 40 mg via SUBCUTANEOUS
  Filled 2015-10-07: qty 0.4

## 2015-10-07 MED ORDER — INFLUENZA VAC SPLIT QUAD 0.5 ML IM SUSY: 0.5000 mL | PREFILLED_SYRINGE | INTRAMUSCULAR | Status: DC

## 2015-10-07 MED ORDER — SENNOSIDES-DOCUSATE SODIUM 8.6-50 MG PO TABS: 1.0000 | ORAL_TABLET | Freq: Every evening | ORAL | Status: DC | PRN

## 2015-10-07 MED ORDER — ALUM & MAG HYDROXIDE-SIMETH 200-200-20 MG/5ML PO SUSP: 30.0000 mL | Freq: Four times a day (QID) | ORAL | Status: DC | PRN

## 2015-10-07 MED ORDER — ALBUTEROL SULFATE HFA 108 (90 BASE) MCG/ACT IN AERS
1.0000 | INHALATION_SPRAY | Freq: Four times a day (QID) | RESPIRATORY_TRACT | Status: DC | PRN
  Filled 2015-10-07 (×3): qty 6.7

## 2015-10-07 MED ORDER — ACETAMINOPHEN 325 MG PO TABS: 650.0000 mg | ORAL_TABLET | Freq: Four times a day (QID) | ORAL | Status: DC | PRN

## 2015-10-07 MED ORDER — ALBUTEROL SULFATE (2.5 MG/3ML) 0.083% IN NEBU: 2.5000 mg | INHALATION_SOLUTION | Freq: Four times a day (QID) | RESPIRATORY_TRACT | Status: DC | PRN

## 2015-10-07 MED ORDER — METHYLPREDNISOLONE SODIUM SUCC 125 MG IJ SOLR
60.0000 mg | Freq: Three times a day (TID) | INTRAMUSCULAR | Status: DC
  Administered 2015-10-07 (×2): 60 mg via INTRAVENOUS
  Filled 2015-10-07 (×2): qty 2

## 2015-10-07 NOTE — Discharge Summary (Signed)
Physician Discharge Summary  Beaulah Lande [id number] DOB: 1981-05-01 DOA: 10/06/2015  PCP: Carmie Kanner, NP  Admit date: 10/06/2015 Discharge date: 10/07/2015  Time spent: > 35 minutes  Recommendations for Outpatient Follow-up:  1. 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  Hospital Course:  Asthma exacerbation - Patient ambulating and breathing off supplemental oxygen without difficulty. - Will discharge with prednisone 3-5 day total treatment course - We'll also provide albuterol HFA on discharge  Procedures:  None  Consultations:  None  Discharge Exam: Filed Vitals:   10/07/15 0955 10/07/15 1434  BP: 117/67 126/65  Pulse: 92 92  Temp: 99.1 F (37.3 C) 99.4 F (37.4 C)  Resp: 20 20    General: Pt in nad, alert and awake Cardiovascular: rrr, no mrg Respiratory: wheezes on expiration (mild), no rhales, equal chest rise.  Discharge Instructions   Discharge Instructions    Call MD for:  difficulty breathing, headache or visual disturbances    Complete by:  As directed      Call MD for:  temperature >100.4    Complete by:  As directed      Diet - low sodium heart healthy    Complete by:  As directed      Discharge instructions    Complete by:  As directed   Please follow-up with your primary care physician within the next one or 2 weeks.     Increase activity slowly    Complete by:  As directed           Current Discharge Medication List    START taking these medications   Details  predniSONE (DELTASONE) 50 MG tablet Take 1  tablet by mouth daily for the next 4 days Qty: 4 tablet, Refills: 0      CONTINUE these medications which have NOT CHANGED   Details  albuterol (PROVENTIL HFA;VENTOLIN HFA) 108 (90 BASE) MCG/ACT inhaler Inhale 2 puffs into the lungs every 6 (six) hours as needed for wheezing or shortness of breath. Qty: 1 Inhaler, Refills: 1      STOP taking these medications     guaiFENesin (ROBITUSSIN) 100 MG/5ML SOLN        No Known Allergies Follow-up Information    Follow up with [facility]. Go on 10/13/2015.   Why:  3pm.  Please bring all discharge papers with you to your appointment.   Contact information:   [address] [place] [phone]       The results of significant diagnostics from this hospitalization (including imaging, microbiology, ancillary and laboratory) are listed below for reference.    Significant Diagnostic Studies: Dg Chest 2 View  10/07/2015  CLINICAL DATA:  35 year old female with shortness of breath EXAM: CHEST  2 VIEW COMPARISON:  Radiograph dated 09/04/2014 FINDINGS: The heart size and mediastinal contours are within normal limits. Both lungs are clear. The visualized skeletal structures are unremarkable. IMPRESSION: No active cardiopulmonary disease. Electronically Signed   By: Anner Crete M.D.   On: 10/07/2015 00:27    Microbiology: No  results found for this or any previous visit (from the past 240 hour(s)).   Labs: Basic Metabolic Panel:  Recent Labs Lab 10/06/15 2341 10/07/15 0747  NA 139 136  K 3.7 3.5  CL 105 104  CO2 24 19*  GLUCOSE 125* 152*  BUN 10 8  CREATININE 0.71 0.78  CALCIUM 8.9 8.8*   Liver Function Tests: No results for input(s): AST, ALT, ALKPHOS, BILITOT, PROT, ALBUMIN in the last 168 hours. No results for input(s): LIPASE, AMYLASE in the last 168 hours. No results for input(s): AMMONIA in the last 168 hours. CBC:  Recent Labs Lab 10/06/15 2341  10/07/15 0747  WBC 14.1* 9.9  NEUTROABS 12.6*  --   HGB 12.6 11.6*  HCT 40.3 37.2  MCV 89.6 88.4  PLT 220 237   Cardiac Enzymes: No results for input(s): CKTOTAL, CKMB, CKMBINDEX, TROPONINI in the last 168 hours. BNP: BNP (last 3 results) No results for input(s): BNP in the last 8760 hours.  ProBNP (last 3 results) No results for input(s): PROBNP in the last 8760 hours.  CBG: No results for input(s): GLUCAP in the last 168 hours.   Signed:  Velvet Bathe MD.  [facility] 10/07/2015, 4:38 PM

## 2015-10-07 NOTE — H&P (Signed)
PCP:   Carmie Kanner, NP   Chief Complaint:  Wheezing  HPI: This is a 35 year old female with known history of asthma. She states she was at work today when he became very hot and she started wheezing. Her wheezing continued to get worse and worse. She had a cough that was productive yellowish sputum. She was short of breath. She denies any fever, chills, nausea, vomiting, diarrhea or abdominal pain. She finally came to the ER. In the ER initially oxygen sat was 84% in room air. She is received multiple treatments, oxygen sats are now improved but she continues to wheeze. The hospitalist have been asked to admit.  Review of Systems:  The patient denies anorexia, fever, weight loss,, vision loss, decreased hearing, hoarseness, chest pain, syncope, dyspnea on exertion, peripheral edema, shortness of breath, wheeze., balance deficits, hemoptysis, abdominal pain, melena, hematochezia, severe indigestion/heartburn, hematuria, incontinence, genital sores, muscle weakness, suspicious skin lesions, transient blindness, difficulty walking, depression, unusual weight change, abnormal bleeding, enlarged lymph nodes, angioedema, and breast masses.  Past Medical History: Past Medical History  Diagnosis Date  . Asthma   . Chronic bronchitis ([place])     "I get it q yr" (04/07/2013)  . Anemia   . History of blood transfusion 04/07/2013    "got my first transfusion today; got 4 units" (04/07/2013)  . Bipolar disorder [facility])     sister denies this hx on 04/07/2013  . Schizophrenia ([place])     "don't really know the type"/sister (04/07/2013)  . Microcytic anemia     Archie Endo 04/07/2013  . Exertional shortness of breath     ? due to anemia   Past Surgical History  Procedure Laterality Date  . No past surgeries    . Abdominal hysterectomy N/A 06/01/2013    Procedure: HYSTERECTOMY ABDOMINAL;  Surgeon: Lavonia Drafts, MD;  Location: [facility];  Service: Gynecology;  Laterality: N/A;  . Bilateral salpingectomy  Bilateral 06/01/2013    Procedure: BILATERAL SALPINGECTOMY;  Surgeon: Lavonia Drafts, MD;  Location: [facility];  Service: Gynecology;  Laterality: Bilateral;  . Cystoscopy w/ ureteral stent placement Bilateral 06/01/2013    Procedure: CYSTOSCOPY WITH BILATERAL STENT REPLACEMENT;  Surgeon: Irine Seal, MD;  Location: [facility];  Service: Urology;  Laterality: Bilateral;    Medications: Prior to Admission medications   Medication Sig Start Date End Date Taking? Authorizing Provider  albuterol (PROVENTIL HFA;VENTOLIN HFA) 108 (90 BASE) MCG/ACT inhaler Inhale 2 puffs into the lungs every 6 (six) hours as needed for wheezing or shortness of breath. 09/06/14  Yes Carly Montey Hora, MD  guaiFENesin (ROBITUSSIN) 100 MG/5ML SOLN Take 10 mLs by mouth every 4 (four) hours as needed for cough or to loosen phlegm.   Yes Historical Provider, MD    Allergies:  No Known Allergies  Social History:  reports that she has never smoked. She has never used smokeless tobacco. She reports that she does not drink alcohol or use illicit drugs.  Family History: Family History  Problem Relation Age of Onset  . Hypertension Mother   . Diabetes Mother   . Asthma Mother   . Stroke Mother   . Cancer Mother   . Asthma Brother   . Heart disease Maternal Aunt     Physical Exam: Filed Vitals:   10/07/15 0200 10/07/15 0230 10/07/15 0300 10/07/15 0327  BP: 100/62 117/74 101/63 116/73  Pulse: 92 98 99 92  Temp:      TempSrc:      Resp: 16 16 16  18  Height:      Weight:      SpO2: 99% 98% 99% 99%    General:  Alert and oriented times three, well developed and nourished, no acute distress Eyes: PERRLA, pink conjunctiva, no scleral icterus ENT: Moist oral mucosa, neck supple, no thyromegaly Lungs: clear to ascultation, wheezing throughout, no crackles, no use of accessory muscles Cardiovascular: regular rate and rhythm, no regurgitation, no gallops, no murmurs. No carotid bruits, no JVD Abdomen: soft, positive BS,  non-tender, non-distended, no organomegaly, not an acute abdomen GU: not examined Neuro: CN II - XII grossly intact, sensation intact Musculoskeletal: strength 5/5 all extremities, no clubbing, cyanosis or edema Skin: no rash, no subcutaneous crepitation, no decubitus Psych: appropriate patient   Labs on Admission:   Recent Labs  10/06/15 2341  NA 139  K 3.7  CL 105  CO2 24  GLUCOSE 125*  BUN 10  CREATININE 0.71  CALCIUM 8.9   No results for input(s): AST, ALT, ALKPHOS, BILITOT, PROT, ALBUMIN in the last 72 hours. No results for input(s): LIPASE, AMYLASE in the last 72 hours.  Recent Labs  10/06/15 2341  WBC 14.1*  NEUTROABS 12.6*  HGB 12.6  HCT 40.3  MCV 89.6  PLT 220   No results for input(s): CKTOTAL, CKMB, CKMBINDEX, TROPONINI in the last 72 hours. Invalid input(s): POCBNP No results for input(s): DDIMER in the last 72 hours. No results for input(s): HGBA1C in the last 72 hours. No results for input(s): CHOL, HDL, LDLCALC, TRIG, CHOLHDL, LDLDIRECT in the last 72 hours. No results for input(s): TSH, T4TOTAL, T3FREE, THYROIDAB in the last 72 hours.  Invalid input(s): FREET3 No results for input(s): VITAMINB12, FOLATE, FERRITIN, TIBC, IRON, RETICCTPCT in the last 72 hours.  Micro Results: No results found for this or any previous visit (from the past 240 hour(s)).   Radiological Exams on Admission: Dg Chest 2 View  10/07/2015  CLINICAL DATA:  35 year old female with shortness of breath EXAM: CHEST  2 VIEW COMPARISON:  Radiograph dated 09/04/2014 FINDINGS: The heart size and mediastinal contours are within normal limits. Both lungs are clear. The visualized skeletal structures are unremarkable. IMPRESSION: No active cardiopulmonary disease. Electronically Signed   By: Anner Crete M.D.   On: 10/07/2015 00:27    Assessment/Plan Present on Admission:  . Asthma exacerbation -Admit to MedSurg -Solu-Medrol IV 6 mg every 8 hours, nebs as needed -Respiratory  evaluate and treat -Her oxygen to keep sats greater 88%  . 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

## 2015-10-07 NOTE — Progress Notes (Signed)
D/C orders received, pt for D/C home today IV D/C'd.  Rx and D/C instructions given with verbalized understanding.  Family at bedside to assist with D/C.  Staff brought pt downstairs via wheelchair.

## 2015-10-07 NOTE — ED Notes (Addendum)
Pt ambulated with NO sob/dizziness/unstead gait noted. NAD. Maintained 97% 02 ra and 105 hr.

## 2015-10-07 NOTE — Care Management Note (Signed)
Case Management Note  Patient Details  Name: Sherri Shaw MRN: [id number] Date of Birth: Feb 02, 1981  Subjective/Objective:                    Action/Plan: With patient's permission, CM made an appointment at the [facility] for 10/13/15 at 3pm.  CM spoke with [name] in [employer], who will be following patient in regards to insurance needs.  Per [name], patient will be seen prior to discharge.  CM awaiting discharge medication list to determine if patient would require the [facility] program for medication assistance.  Expected Discharge Date:  10/09/15               Expected Discharge Plan:  Home/Self Care  In-House Referral:     Discharge planning Services  CM Consult, [facility]  Post Acute Care Choice:    Choice offered to:  Patient  DME Arranged:    DME Agency:     HH Arranged:    [place] Agency:     Status of Service:  In process, will continue to follow  Medicare Important Message Given:    Date Medicare IM Given:    Medicare IM give by:    Date Additional Medicare IM Given:    Additional Medicare Important Message give by:     If discussed at [place] of Stay Meetings, dates discussed:    Additional CommentsRolm Baptise, RN 10/07/2015, 2:53 PM [phone]

## 2015-10-07 NOTE — ED Notes (Signed)
Pt not ambulated yet due to Neb treatment still running.

## 2015-10-07 NOTE — Progress Notes (Signed)
CM met with patient to discuss discharge.  Patient states that she does not have a PCP.  On chart review, patient has been seen at the [facility] in early 2016.  Patient is agreeable to returning to the clinic.  CM attempted to obtain an appointment, but was asked to call back after 2pm today.  This information was shared with patient and bedside RN.  CM will follow-up this afternoon.  Lorne Skeens RN, MSN [phone]

## 2015-10-13 ENCOUNTER — Ambulatory Visit: Payer: Medicaid Other | Attending: Internal Medicine | Admitting: Physician Assistant

## 2015-10-13 ENCOUNTER — Encounter: Payer: Self-pay | Admitting: Physician Assistant

## 2015-10-13 VITALS — BP 107/69 | HR 68 | Temp 97.7°F | Resp 16 | Ht 62.0 in | Wt 131.0 lb

## 2015-10-13 DIAGNOSIS — F319 Bipolar disorder, unspecified: Secondary | ICD-10-CM | POA: Diagnosis not present

## 2015-10-13 DIAGNOSIS — Z79899 Other long term (current) drug therapy: Secondary | ICD-10-CM | POA: Insufficient documentation

## 2015-10-13 DIAGNOSIS — D509 Iron deficiency anemia, unspecified: Secondary | ICD-10-CM | POA: Diagnosis not present

## 2015-10-13 DIAGNOSIS — F209 Schizophrenia, unspecified: Secondary | ICD-10-CM | POA: Diagnosis not present

## 2015-10-13 DIAGNOSIS — E559 Vitamin D deficiency, unspecified: Secondary | ICD-10-CM | POA: Diagnosis not present

## 2015-10-13 DIAGNOSIS — J45901 Unspecified asthma with (acute) exacerbation: Secondary | ICD-10-CM | POA: Insufficient documentation

## 2015-10-13 DIAGNOSIS — R739 Hyperglycemia, unspecified: Secondary | ICD-10-CM | POA: Diagnosis not present

## 2015-10-13 DIAGNOSIS — J4521 Mild intermittent asthma with (acute) exacerbation: Secondary | ICD-10-CM

## 2015-10-13 DIAGNOSIS — J4520 Mild intermittent asthma, uncomplicated: Secondary | ICD-10-CM | POA: Insufficient documentation

## 2015-10-13 LAB — POCT GLYCOSYLATED HEMOGLOBIN (HGB A1C): Hemoglobin A1C: 5

## 2015-10-13 NOTE — Patient Instructions (Signed)
Asthma, Adult Asthma is a recurring condition in which the airways tighten and narrow. Asthma can make it difficult to breathe. It can cause coughing, wheezing, and shortness of breath. Asthma episodes, also called asthma attacks, range from minor to life-threatening. Asthma cannot be cured, but medicines and lifestyle changes can help control it. CAUSES Asthma is believed to be caused by inherited (genetic) and environmental factors, but its exact cause is unknown. Asthma may be triggered by allergens, lung infections, or irritants in the air. Asthma triggers are different for each person. Common triggers include:   Animal dander.  Dust mites.  Cockroaches.  Pollen from trees or grass.  Mold.  Smoke.  Air pollutants such as dust, household cleaners, hair sprays, aerosol sprays, paint fumes, strong chemicals, or strong odors.  Cold air, weather changes, and winds (which increase molds and pollens in the air).  Strong emotional expressions such as crying or laughing hard.  Stress.  Certain medicines (such as aspirin) or types of drugs (such as beta-blockers).  Sulfites in foods and drinks. Foods and drinks that may contain sulfites include dried fruit, potato chips, and sparkling grape juice.  Infections or inflammatory conditions such as the flu, a cold, or an inflammation of the nasal membranes (rhinitis).  Gastroesophageal reflux disease (GERD).  Exercise or strenuous activity. SYMPTOMS Symptoms may occur immediately after asthma is triggered or many hours later. Symptoms include:  Wheezing.  Excessive nighttime or early morning coughing.  Frequent or severe coughing with a common cold.  Chest tightness.  Shortness of breath. DIAGNOSIS  The diagnosis of asthma is made by a review of your medical history and a physical exam. Tests may also be performed. These may include:  Lung function studies. These tests show how much air you breathe in and out.  Allergy  tests.  Imaging tests such as X-rays. TREATMENT  Asthma cannot be cured, but it can usually be controlled. Treatment involves identifying and avoiding your asthma triggers. It also involves medicines. There are 2 classes of medicine used for asthma treatment:   Controller medicines. These prevent asthma symptoms from occurring. They are usually taken every day.  Reliever or rescue medicines. These quickly relieve asthma symptoms. They are used as needed and provide short-term relief. Your health care provider will help you create an asthma action plan. An asthma action plan is a written plan for managing and treating your asthma attacks. It includes a list of your asthma triggers and how they may be avoided. It also includes information on when medicines should be taken and when their dosage should be changed. An action plan may also involve the use of a device called a peak flow meter. A peak flow meter measures how well the lungs are working. It helps you monitor your condition. HOME CARE INSTRUCTIONS   Take medicines only as directed by your health care provider. Speak with your health care provider if you have questions about how or when to take the medicines.  Use a peak flow meter as directed by your health care provider. Record and keep track of readings.  Understand and use the action plan to help minimize or stop an asthma attack without needing to seek medical care.  Control your home environment in the following ways to help prevent asthma attacks:  Do not smoke. Avoid being exposed to secondhand smoke.  Change your heating and air conditioning filter regularly.  Limit your use of fireplaces and wood stoves.  Get rid of pests (such as roaches   and mice) and their droppings.  Throw away plants if you see mold on them.  Clean your floors and dust regularly. Use unscented cleaning products.  Try to have someone else vacuum for you regularly. Stay out of rooms while they are  being vacuumed and for a short while afterward. If you vacuum, use a dust mask from a hardware store, a double-layered or microfilter vacuum cleaner bag, or a vacuum cleaner with a HEPA filter.  Replace carpet with wood, tile, or vinyl flooring. Carpet can trap dander and dust.  Use allergy-proof pillows, mattress covers, and box spring covers.  Wash bed sheets and blankets every week in hot water and dry them in a dryer.  Use blankets that are made of polyester or cotton.  Clean bathrooms and kitchens with bleach. If possible, have someone repaint the walls in these rooms with mold-resistant paint. Keep out of the rooms that are being cleaned and painted.  Wash hands frequently. SEEK MEDICAL CARE IF:   You have wheezing, shortness of breath, or a cough even if taking medicine to prevent attacks.  The colored mucus you cough up (sputum) is thicker than usual.  Your sputum changes from clear or white to yellow, green, gray, or bloody.  You have any problems that may be related to the medicines you are taking (such as a rash, itching, swelling, or trouble breathing).  You are using a reliever medicine more than 2-3 times per week.  Your peak flow is still at 50-79% of your personal best after following your action plan for 1 hour.  You have a fever. SEEK IMMEDIATE MEDICAL CARE IF:   You seem to be getting worse and are unresponsive to treatment during an asthma attack.  You are short of breath even at rest.  You get short of breath when doing very little physical activity.  You have difficulty eating, drinking, or talking due to asthma symptoms.  You develop chest pain.  You develop a fast heartbeat.  You have a bluish color to your lips or fingernails.  You are light-headed, dizzy, or faint.  Your peak flow is less than 50% of your personal best.   This information is not intended to replace advice given to you by your health care provider. Make sure you discuss any  questions you have with your health care provider.   Document Released: 07/23/2005 Document Revised: 04/13/2015 Document Reviewed: 02/19/2013 Elsevier Interactive Patient Education 2016 [employer] Vitamin D Deficiency Vitamin D deficiency is when your body does not have enough vitamin D. Vitamin D is important because:  It helps your body use other minerals that your body needs.  It helps keep your bones strong and healthy.  It may help to prevent some diseases.  It helps your heart and other muscles work well. You can get vitamin D by:  Eating foods with vitamin D in them.  Drinking or eating milk or other foods that have had vitamin D added to them.  Taking a vitamin D supplement.  Being in the sun. Not getting enough vitamin D can make your bones become soft. It can also cause other health problems. HOME CARE  Take medicines and supplements only as told by your doctor.  Eat foods that have vitamin D. These include:  Dairy products, cereals, or juices with added vitamin D. Check the label for vitamin D.   Fatty fish like salmon or trout.   Eggs.   Oysters.   Do not use tanning beds.  Stay at a healthy weight. Lose weight, if needed.   Keep all follow-up visits as told by your doctor. This is important.  GET HELP IF:  Your symptoms do not go away.  You feel sick to your stomach (nauseous).  Youthrow up (vomit).   You poop less often than usual or you have trouble pooping (constipation).   This information is not intended to replace advice given to you by your health care provider. Make sure you discuss any questions you have with your health care provider.   Document Released: 07/12/2011 Document Revised: 04/13/2015 Document Reviewed: 12/08/2014 Elsevier Interactive Patient Education [employer].

## 2015-10-13 NOTE — Progress Notes (Signed)
Patient's here for hospital f/up asthma attack.   Patient report feeling good today, no concerns, no pain today.

## 2015-10-13 NOTE — Progress Notes (Signed)
Patient ID: Sherri Shaw, female   DOB: 1980-08-28, 35 y.o.   MRN: [id number]   Sherri Shaw, is a 35 y.o. female  [id number]  [id number]  DOB - 02-04-1981  Chief Complaint  Patient presents with  . Hospitalization Follow-up  . Asthma        Subjective:   Sherri Shaw is a 35 y.o. female here today for a follow up visit from a recent ED visit and overnight admission for an acute exacerbation of asthma. She was discharged on Prednisone and has 1 more dose to take.  She hasn't had to use her albuterol inhaler in the last few days.    Generally, other than her recent exacerbation, she usu only uses her albuterol inhaler about 2X/month. Patient has No headache, No chest pain, No abdominal pain - No Nausea, No new weakness tingling or numbness, No Cough - SOB.  Upon chart review, I noticed her Vit D levels were very lowe about 1 year ago and she has not been taking supplements.    ALLERGIES: No Known Allergies  PAST MEDICAL HISTORY: Past Medical History  Diagnosis Date  . Asthma   . Chronic bronchitis ([place])     "I get it q yr" (04/07/2013)  . Anemia   . History of blood transfusion 04/07/2013    "got my first transfusion today; got 4 units" (04/07/2013)  . Bipolar disorder [facility])     sister denies this hx on 04/07/2013  . Schizophrenia ([place])     "don't really know the type"/sister (04/07/2013)  . Microcytic anemia     Archie Endo 04/07/2013  . Exertional shortness of breath     ? due to anemia   ROS neg    MEDICATIONS AT HOME: Prior to Admission medications   Medication Sig Start Date End Date Taking? Authorizing Provider  albuterol (PROVENTIL HFA;VENTOLIN HFA) 108 (90 BASE) MCG/ACT inhaler Inhale 2 puffs into the lungs every 6 (six) hours as needed for wheezing or shortness of breath. 09/06/14  Yes Juliet Rude, MD  predniSONE (DELTASONE) 50 MG tablet Take 1 tablet by mouth daily for the next 4 days 10/08/15  Yes Velvet Bathe, MD     Objective:   Filed Vitals:   10/13/15 1519    BP: 107/69  Pulse: 68  Temp: 97.7 F (36.5 C)  TempSrc: Oral  Resp: 16  Height: 5\' 2"  (1.575 m)  Weight: 131 lb (59.421 kg)  SpO2: 100%    Exam General appearance : Awake, alert, not in any distress. Speech Clear. Not toxic looking HEENT: Atraumatic and Normocephalic, pupils equally reactive to light and accomodation Neck: supple, no JVD. No cervical lymphadenopathy.  Chest:Good air entry bilaterally, no added sounds  CVS: S1 S2 regular, no murmurs.  Abdomen: Bowel sounds present, Non tender and not distended with no gaurding, rigidity or rebound. Extremities: B/L Lower Ext shows no edema, both legs are warm to touch Neurology: Awake alert, and oriented X 3, CN II-XII intact, Non focal Skin:No Rash  Data Review Lab Results  Component Value Date   HGBA1C 5.0 10/13/2015   HGBA1C 5.1 09/15/2014     Assessment & Plan   1. Asthma, mild intermittent, with acute exacerbation-resolved Other than her recent exacerbation and admission with asthma, it sounds as though her asthma is well controlled generally speaking because she usually only uses her rescue inhaler about 2X/month.    2. Hyperglycemia - POCT A1C-normal  3. Vitamin D deficiency From reviewing chart, her last Vit D was drawn about  1 year ago and was extremely low. We will draw today and treat accordingly and plan to recheck in about 10 weeks at her f/up appointment.  - Vitamin D, 25-hydroxy     Patient have been counseled extensively about nutrition and exercise  Return in about 10 weeks (around 12/22/2015) for vit d deficiency and asthma f/up visit.  The patient was given clear instructions to go to ER or return to medical center if symptoms don't improve, worsen or new problems develop. The patient verbalized understanding. The patient was told to call to get lab results if they haven't heard anything in the next week.    Freeman Caldron, PA-C [facility] [place],  [place]   10/13/2015, 3:50 PM

## 2015-10-14 ENCOUNTER — Telehealth: Payer: Self-pay

## 2015-10-14 ENCOUNTER — Other Ambulatory Visit: Payer: Self-pay | Admitting: Physician Assistant

## 2015-10-14 LAB — VITAMIN D 25 HYDROXY (VIT D DEFICIENCY, FRACTURES): Vit D, 25-Hydroxy: 9 ng/mL — ABNORMAL LOW (ref 30–100)

## 2015-10-14 MED ORDER — ERGOCALCIFEROL 1.25 MG (50000 UT) PO CAPS: 50000.0000 [IU] | ORAL_CAPSULE | ORAL | Status: DC

## 2015-10-14 NOTE — Telephone Encounter (Signed)
CMA called patient, patient did not answer. Left a message for the patient to return my call.

## 2015-10-14 NOTE — Telephone Encounter (Signed)
-----   Message from Argentina Donovan, [place] sent at 10/14/2015 10:54 AM EST ----- Call patient and let her know that ehr vitamin D level is definitely still low.  I sent her a prescription to [facility].  She needs to take 1 tablet each week for the next 3 months and we will recheck this when we see her back.

## 2015-10-19 NOTE — Telephone Encounter (Signed)
CMA called patient, patient did not answer. Patient was left a 2nd message to return my call.

## 2015-10-19 NOTE — Telephone Encounter (Signed)
-----   Message from Argentina Donovan, [place] sent at 10/14/2015 10:54 AM EST ----- Call patient and let her know that ehr vitamin D level is definitely still low.  I sent her a prescription to [facility].  She needs to take 1 tablet each week for the next 3 months and we will recheck this when we see her back.

## 2015-10-20 NOTE — Telephone Encounter (Signed)
CMA called patient, patient did not answer. Patient will be mailed a letter to address on file. 3rd attempt to reach patient.

## 2015-10-20 NOTE — Telephone Encounter (Signed)
-----   Message from Argentina Donovan, [place] sent at 10/14/2015 10:54 AM EST ----- Call patient and let her know that ehr vitamin D level is definitely still low.  I sent her a prescription to [facility].  She needs to take 1 tablet each week for the next 3 months and we will recheck this when we see her back.

## 2015-11-02 ENCOUNTER — Telehealth: Payer: Self-pay | Admitting: Physician Assistant

## 2015-11-02 NOTE — Telephone Encounter (Signed)
Patient states she received letter from our clinic.Marland KitchenMarland KitchenMarland KitchenMarland Kitchenplease follow up with patient regarding results

## 2015-11-07 ENCOUNTER — Telehealth: Payer: Self-pay

## 2015-11-07 NOTE — Telephone Encounter (Signed)
CMA called patient, patient didn't answer. Left a message for the patient to return my call asap

## 2015-11-08 NOTE — Telephone Encounter (Addendum)
CMA called patient, patient didn't answer. Patient was called twice after patient received the letter in the mail stating we're trying to reach her. Please give patient lab results.  Note to patient:    ----- Message from Argentina Donovan, PA-C sent at 10/14/2015 10:54 AM EST ----- Call patient and let her know that ehr vitamin D level is definitely still low. I sent her a prescription to [facility]. She needs to take 1 tablet each week for the next 3 months and we will recheck this when we see her back.

## 2015-11-09 NOTE — Telephone Encounter (Signed)
CMA called patient, patient didn't answer. CMA called patient after failed attempts reaching her.  Note to patient:

## 2015-12-02 ENCOUNTER — Encounter (HOSPITAL_COMMUNITY): Payer: Self-pay | Admitting: Emergency Medicine

## 2015-12-02 ENCOUNTER — Emergency Department (HOSPITAL_COMMUNITY): Payer: Medicaid Other

## 2015-12-02 ENCOUNTER — Emergency Department (HOSPITAL_COMMUNITY)
Admission: EM | Admit: 2015-12-02 | Discharge: 2015-12-02 | Disposition: A | Discharge status: Home / Self Care | Payer: Medicaid Other | Attending: Emergency Medicine | Admitting: Emergency Medicine

## 2015-12-02 DIAGNOSIS — Z8659 Personal history of other mental and behavioral disorders: Secondary | ICD-10-CM | POA: Insufficient documentation

## 2015-12-02 DIAGNOSIS — R062 Wheezing: Secondary | ICD-10-CM

## 2015-12-02 DIAGNOSIS — J4541 Moderate persistent asthma with (acute) exacerbation: Secondary | ICD-10-CM | POA: Diagnosis not present

## 2015-12-02 DIAGNOSIS — R059 Cough, unspecified: Secondary | ICD-10-CM

## 2015-12-02 DIAGNOSIS — Z862 Personal history of diseases of the blood and blood-forming organs and certain disorders involving the immune mechanism: Secondary | ICD-10-CM | POA: Diagnosis not present

## 2015-12-02 DIAGNOSIS — Z79899 Other long term (current) drug therapy: Secondary | ICD-10-CM | POA: Insufficient documentation

## 2015-12-02 DIAGNOSIS — R0602 Shortness of breath: Secondary | ICD-10-CM

## 2015-12-02 DIAGNOSIS — R05 Cough: Secondary | ICD-10-CM

## 2015-12-02 MED ORDER — IPRATROPIUM BROMIDE 0.02 % IN SOLN
0.5000 mg | Freq: Once | RESPIRATORY_TRACT | Status: AC
  Administered 2015-12-02: 0.5 mg via RESPIRATORY_TRACT
  Filled 2015-12-02: qty 2.5

## 2015-12-02 MED ORDER — ALBUTEROL SULFATE HFA 108 (90 BASE) MCG/ACT IN AERS: 2.0000 | INHALATION_SPRAY | RESPIRATORY_TRACT | Status: DC | PRN

## 2015-12-02 MED ORDER — ALBUTEROL SULFATE (2.5 MG/3ML) 0.083% IN NEBU
INHALATION_SOLUTION | RESPIRATORY_TRACT | Status: AC
  Filled 2015-12-02: qty 6

## 2015-12-02 MED ORDER — ALBUTEROL SULFATE (2.5 MG/3ML) 0.083% IN NEBU
5.0000 mg | INHALATION_SOLUTION | Freq: Once | RESPIRATORY_TRACT | Status: AC
  Administered 2015-12-02: 5 mg via RESPIRATORY_TRACT
  Filled 2015-12-02: qty 6

## 2015-12-02 MED ORDER — PREDNISONE 20 MG PO TABS: ORAL_TABLET | ORAL | Status: DC

## 2015-12-02 MED ORDER — PREDNISONE 20 MG PO TABS
60.0000 mg | ORAL_TABLET | Freq: Once | ORAL | Status: AC
  Administered 2015-12-02: 60 mg via ORAL
  Filled 2015-12-02: qty 3

## 2015-12-02 MED ORDER — ALBUTEROL SULFATE (2.5 MG/3ML) 0.083% IN NEBU
5.0000 mg | INHALATION_SOLUTION | Freq: Once | RESPIRATORY_TRACT | Status: AC
  Administered 2015-12-02: 5 mg via RESPIRATORY_TRACT

## 2015-12-02 MED ORDER — ALBUTEROL SULFATE HFA 108 (90 BASE) MCG/ACT IN AERS
2.0000 | INHALATION_SPRAY | Freq: Once | RESPIRATORY_TRACT | Status: AC
  Administered 2015-12-02: 2 via RESPIRATORY_TRACT
  Filled 2015-12-02: qty 6.7

## 2015-12-02 NOTE — Discharge Instructions (Signed)
Continue to stay well-hydrated. Use Mucinex for cough suppression/expectoration of mucus. Use over the counter antihistamines such as zyrtec, claritin, or allegra to decrease symptoms. Take prednisone as directed for your asthma exacerbation. Use inhaler as directed, as needed for cough/chest congestion/wheezing/shortness of breath. Followup with your primary care doctor in 5-7 days for recheck of ongoing symptoms. Return to emergency department for emergent changing or worsening of symptoms.   Asthma, Acute Bronchospasm Acute bronchospasm caused by asthma is also referred to as an asthma attack. Bronchospasm means your air passages become narrowed. The narrowing is caused by inflammation and tightening of the muscles in the air tubes (bronchi) in your lungs. This can make it hard to breathe or cause you to wheeze and cough. CAUSES Possible triggers are:  Animal dander from the skin, hair, or feathers of animals.  Dust mites contained in house dust.  Cockroaches.  Pollen from trees or grass.  Mold.  Cigarette or tobacco smoke.  Air pollutants such as dust, household cleaners, hair sprays, aerosol sprays, paint fumes, strong chemicals, or strong odors.  Cold air or weather changes. Cold air may trigger inflammation. Winds increase molds and pollens in the air.  Strong emotions such as crying or laughing hard.  Stress.  Certain medicines such as aspirin or beta-blockers.  Sulfites in foods and drinks, such as dried fruits and wine.  Infections or inflammatory conditions, such as a flu, cold, or inflammation of the nasal membranes (rhinitis).  Gastroesophageal reflux disease (GERD). GERD is a condition where stomach acid backs up into your esophagus.  Exercise or strenuous activity. SIGNS AND SYMPTOMS   Wheezing.  Excessive coughing, particularly at night.  Chest tightness.  Shortness of breath. DIAGNOSIS  Your health care provider will ask you about your medical history  and perform a physical exam. A chest X-ray or blood testing may be performed to look for other causes of your symptoms or other conditions that may have triggered your asthma attack. TREATMENT  Treatment is aimed at reducing inflammation and opening up the airways in your lungs. Most asthma attacks are treated with inhaled medicines. These include quick relief or rescue medicines (such as bronchodilators) and controller medicines (such as inhaled corticosteroids). These medicines are sometimes given through an inhaler or a nebulizer. Systemic steroid medicine taken by mouth or given through an IV tube also can be used to reduce the inflammation when an attack is moderate or severe. Antibiotic medicines are only used if a bacterial infection is present.  HOME CARE INSTRUCTIONS   Rest.  Drink plenty of liquids. This helps the mucus to remain thin and be easily coughed up. Only use caffeine in moderation and do not use alcohol until you have recovered from your illness.  Do not smoke. Avoid being exposed to secondhand smoke.  You play a critical role in keeping yourself in good health. Avoid exposure to things that cause you to wheeze or to have breathing problems.  Keep your medicines up-to-date and available. Carefully follow your health care provider's treatment plan.  Take your medicine exactly as prescribed.  When pollen or pollution is bad, keep windows closed and use an air conditioner or go to places with air conditioning.  Asthma requires careful medical care. See your health care provider for a follow-up as advised. If you are more than [redacted] weeks pregnant and you were prescribed any new medicines, let your obstetrician know about the visit and how you are doing. Follow up with your health care provider as directed.  After you have recovered from your asthma attack, make an appointment with your outpatient doctor to talk about ways to reduce the likelihood of future attacks. If you do not  have a doctor who manages your asthma, make an appointment with a primary care doctor to discuss your asthma. SEEK IMMEDIATE MEDICAL CARE IF:   You are getting worse.  You have trouble breathing. If severe, call your local emergency services (911 in the [place]  You develop chest pain or discomfort.  You are vomiting.  You are not able to keep fluids down.  You are coughing up yellow, green, brown, or bloody sputum.  You have a fever and your symptoms suddenly get worse.  You have trouble swallowing. MAKE SURE YOU:   Understand these instructions.  Will watch your condition.  Will get help right away if you are not doing well or get worse.   This information is not intended to replace advice given to you by your health care provider. Make sure you discuss any questions you have with your health care provider.   Document Released: 11/07/2006 Document Revised: 07/28/2013 Document Reviewed: 01/28/2013 Elsevier Interactive Patient Education 2016 [place] Prevention While you may not be able to control the fact that you have asthma, you can take actions to prevent asthma attacks. The best way to prevent asthma attacks is to maintain good control of your asthma. You can achieve this by:  Taking your medicines as directed.  Avoiding things that can irritate your airways or make your asthma symptoms worse (asthma triggers).  Keeping track of how well your asthma is controlled and of any changes in your symptoms.  Responding quickly to worsening asthma symptoms (asthma attack).  Seeking emergency care when it is needed. WHAT ARE SOME WAYS TO PREVENT AN ASTHMA ATTACK? Have a Plan Work with your health care provider to create a written plan for managing and treating your asthma attacks (asthma action plan). This plan includes:  A list of your asthma triggers and how you can avoid them.  Information on when medicines should be taken and when their dosages  should be changed.  The use of a device that measures how well your lungs are working (peak flow meter). Monitor Your Asthma Use your peak flow meter and record your results in a journal every day. A drop in your peak flow numbers on one or more days may indicate the start of an asthma attack. This can happen even before you start to feel symptoms. You can prevent an asthma attack from getting worse by following the steps in your asthma action plan. Avoid Asthma Triggers Work with your asthma health care provider to find out what your asthma triggers are. This can be done by:  Allergy testing.  Keeping a journal that notes when asthma attacks occur and the factors that may have contributed to them.  Determining if there are other medical conditions that are making your asthma worse. Once you have determined your asthma triggers, take steps to avoid them. This may include avoiding excessive or prolonged exposure to:  Dust. Have someone dust and vacuum your home for you once or twice a week. Using a high-efficiency particulate arrestance (HEPA) vacuum is best.  Smoke. This includes campfire smoke, forest fire smoke, and secondhand smoke from tobacco products.  Pet dander. Avoid contact with animals that you know you are allergic to.  Allergens from trees, grasses or pollens. Avoid spending a lot of time outdoors when pollen  counts are high, and on very windy days.  Very cold, dry, or humid air.  Mold.  Foods that contain high amounts of sulfites.  Strong odors.  Outdoor air pollutants, such as [employer].  Indoor air pollutants, such as aerosol sprays and fumes from household cleaners.  Household pests, including dust mites and cockroaches, and pest droppings.  Certain medicines, including NSAIDs. Always talk to your health care provider before stopping or starting any new medicines. Medicines Take over-the-counter and prescription medicines only as told by your health care  provider. Many asthma attacks can be prevented by carefully following your medicine schedule. Taking your medicines correctly is especially important when you cannot avoid certain asthma triggers. Act Quickly If an asthma attack does happen, acting quickly can decrease how severe it is and how long it lasts. Take these steps:   Pay attention to your symptoms. If you are coughing, wheezing, or having difficulty breathing, do not wait to see if your symptoms go away on their own. Follow your asthma action plan.  If you have followed your asthma action plan and your symptoms are not improving, call your health care provider or seek immediate medical care at the nearest hospital. It is important to note how often you need to use your fast-acting rescue inhaler. If you are using your rescue inhaler more often, it may mean that your asthma is not under control. Adjusting your asthma treatment plan may help you to prevent future asthma attacks and help you to gain better control of your condition. HOW CAN I PREVENT AN ASTHMA ATTACK WHEN I EXERCISE? Follow advice from your health care provider about whether you should use your fast-acting inhaler before exercising. Many people with asthma experience exercise-induced bronchoconstriction (EIB). This condition often worsens during vigorous exercise in cold, humid, or dry environments. Usually, people with EIB can stay very active by pre-treating with a fast-acting inhaler before exercising.   This information is not intended to replace advice given to you by your health care provider. Make sure you discuss any questions you have with your health care provider.   Document Released: 07/11/2009 Document Revised: 04/13/2015 Document Reviewed: 12/23/2014 Elsevier Interactive Patient Education 2016 [employer]  Cough, Adult Coughing is a reflex that clears your throat and your airways. Coughing helps to heal and protect your lungs. It is normal to cough  occasionally, but a cough that happens with other symptoms or lasts a long time may be a sign of a condition that needs treatment. A cough may last only 2-3 weeks (acute), or it may last longer than 8 weeks (chronic). CAUSES Coughing is commonly caused by:  Breathing in substances that irritate your lungs.  A viral or bacterial respiratory infection.  Allergies.  Asthma.  Postnasal drip.  Smoking.  Acid backing up from the stomach into the esophagus (gastroesophageal reflux).  Certain medicines.  Chronic lung problems, including COPD (or rarely, lung cancer).  Other medical conditions such as heart failure. HOME CARE INSTRUCTIONS  Pay attention to any changes in your symptoms. Take these actions to help with your discomfort:  Take medicines only as told by your health care provider.  If you were prescribed an antibiotic medicine, take it as told by your health care provider. Do not stop taking the antibiotic even if you start to feel better.  Talk with your health care provider before you take a cough suppressant medicine.  Drink enough fluid to keep your urine clear or pale yellow.  If the air  is dry, use a cold steam vaporizer or humidifier in your bedroom or your home to help loosen secretions.  Avoid anything that causes you to cough at work or at home.  If your cough is worse at night, try sleeping in a semi-upright position.  Avoid cigarette smoke. If you smoke, quit smoking. If you need help quitting, ask your health care provider.  Avoid caffeine.  Avoid alcohol.  Rest as needed. SEEK MEDICAL CARE IF:   You have new symptoms.  You cough up pus.  Your cough does not get better after 2-3 weeks, or your cough gets worse.  You cannot control your cough with suppressant medicines and you are losing sleep.  You develop pain that is getting worse or pain that is not controlled with pain medicines.  You have a fever.  You have unexplained weight  loss.  You have night sweats. SEEK IMMEDIATE MEDICAL CARE IF:  You cough up blood.  You have difficulty breathing.  Your heartbeat is very fast.   This information is not intended to replace advice given to you by your health care provider. Make sure you discuss any questions you have with your health care provider.   Document Released: 01/19/2011 Document Revised: 04/13/2015 Document Reviewed: 09/29/2014 Elsevier Interactive Patient Education 2016 [employer].  How to Use an Inhaler Proper inhaler technique is very important. Good technique ensures that the medicine reaches the lungs. Poor technique results in depositing the medicine on the tongue and back of the throat rather than in the airways. If you do not use the inhaler with good technique, the medicine will not help you. STEPS TO FOLLOW IF USING AN INHALER WITHOUT AN EXTENSION TUBE  Remove the cap from the inhaler.  If you are using the inhaler for the first time, you will need to prime it. Shake the inhaler for 5 seconds and release four puffs into the air, away from your face. Ask your health care provider or pharmacist if you have questions about priming your inhaler.  Shake the inhaler for 5 seconds before each breath in (inhalation).  Position the inhaler so that the top of the canister faces up.  Put your index finger on the top of the medicine canister. Your thumb supports the bottom of the inhaler.  Open your mouth.  Either place the inhaler between your teeth and place your lips tightly around the mouthpiece, or hold the inhaler 1-2 inches away from your open mouth. If you are unsure of which technique to use, ask your health care provider.  Breathe out (exhale) normally and as completely as possible.  Press the canister down with your index finger to release the medicine.  At the same time as the canister is pressed, inhale deeply and slowly until your lungs are completely filled. This should take 4-6  seconds. Keep your tongue down.  Hold the medicine in your lungs for 5-10 seconds (10 seconds is best). This helps the medicine get into the small airways of your lungs.  Breathe out slowly, through pursed lips. Whistling is an example of pursed lips.  Wait at least 15-30 seconds between puffs. Continue with the above steps until you have taken the number of puffs your health care provider has ordered. Do not use the inhaler more than your health care provider tells you.  Replace the cap on the inhaler.  Follow the directions from your health care provider or the inhaler insert for cleaning the inhaler. STEPS TO FOLLOW IF USING AN  INHALER WITH AN EXTENSION (SPACER)  Remove the cap from the inhaler.  If you are using the inhaler for the first time, you will need to prime it. Shake the inhaler for 5 seconds and release four puffs into the air, away from your face. Ask your health care provider or pharmacist if you have questions about priming your inhaler.  Shake the inhaler for 5 seconds before each breath in (inhalation).  Place the open end of the spacer onto the mouthpiece of the inhaler.  Position the inhaler so that the top of the canister faces up and the spacer mouthpiece faces you.  Put your index finger on the top of the medicine canister. Your thumb supports the bottom of the inhaler and the spacer.  Breathe out (exhale) normally and as completely as possible.  Immediately after exhaling, place the spacer between your teeth and into your mouth. Close your lips tightly around the spacer.  Press the canister down with your index finger to release the medicine.  At the same time as the canister is pressed, inhale deeply and slowly until your lungs are completely filled. This should take 4-6 seconds. Keep your tongue down and out of the way.  Hold the medicine in your lungs for 5-10 seconds (10 seconds is best). This helps the medicine get into the small airways of your lungs.  Exhale.  Repeat inhaling deeply through the spacer mouthpiece. Again hold that breath for up to 10 seconds (10 seconds is best). Exhale slowly. If it is difficult to take this second deep breath through the spacer, breathe normally several times through the spacer. Remove the spacer from your mouth.  Wait at least 15-30 seconds between puffs. Continue with the above steps until you have taken the number of puffs your health care provider has ordered. Do not use the inhaler more than your health care provider tells you.  Remove the spacer from the inhaler, and place the cap on the inhaler.  Follow the directions from your health care provider or the inhaler insert for cleaning the inhaler and spacer. If you are using different kinds of inhalers, use your quick relief medicine to open the airways 10-15 minutes before using a steroid if instructed to do so by your health care provider. If you are unsure which inhalers to use and the order of using them, ask your health care provider, nurse, or respiratory therapist. If you are using a steroid inhaler, always rinse your mouth with water after your last puff, then gargle and spit out the water. Do not swallow the water. AVOID:  Inhaling before or after starting the spray of medicine. It takes practice to coordinate your breathing with triggering the spray.  Inhaling through the nose (rather than the mouth) when triggering the spray. HOW TO DETERMINE IF YOUR INHALER IS FULL OR NEARLY EMPTY You cannot know when an inhaler is empty by shaking it. A few inhalers are now being made with dose counters. Ask your health care provider for a prescription that has a dose counter if you feel you need that extra help. If your inhaler does not have a counter, ask your health care provider to help you determine the date you need to refill your inhaler. Write the refill date on a calendar or your inhaler canister. Refill your inhaler 7-10 days before it runs out. Be  sure to keep an adequate supply of medicine. This includes making sure it is not expired, and that you have a spare inhaler.  SEEK MEDICAL CARE IF:   Your symptoms are only partially relieved with your inhaler.  You are having trouble using your inhaler.  You have some increase in phlegm. SEEK IMMEDIATE MEDICAL CARE IF:   You feel little or no relief with your inhalers. You are still wheezing and are feeling shortness of breath or tightness in your chest or both.  You have dizziness, headaches, or a fast heart rate.  You have chills, fever, or night sweats.  You have a noticeable increase in phlegm production, or there is blood in the phlegm. MAKE SURE YOU:   Understand these instructions.  Will watch your condition.  Will get help right away if you are not doing well or get worse.   This information is not intended to replace advice given to you by your health care provider. Make sure you discuss any questions you have with your health care provider.   Document Released: 07/20/2000 Document Revised: 05/13/2013 Document Reviewed: 02/19/2013 Elsevier Interactive Patient Education 2016 [place] of Breath Shortness of breath means you have trouble breathing. Shortness of breath needs medical care right away. HOME CARE   Do not smoke.  Avoid being around chemicals or things (paint fumes, dust) that may bother your breathing.  Rest as needed. Slowly begin your normal activities.  Only take medicines as told by your doctor.  Keep all doctor visits as told. GET HELP RIGHT AWAY IF:   Your shortness of breath gets worse.  You feel lightheaded, pass out (faint), or have a cough that is not helped by medicine.  You cough up blood.  You have pain with breathing.  You have pain in your chest, arms, shoulders, or belly (abdomen).  You have a fever.  You cannot walk up stairs or exercise the way you normally do.  You do not get better in the time  expected.  You have a hard time doing normal activities even with rest.  You have problems with your medicines.  You have any new symptoms. MAKE SURE YOU:  Understand these instructions.  Will watch your condition.  Will get help right away if you are not doing well or get worse.   This information is not intended to replace advice given to you by your health care provider. Make sure you discuss any questions you have with your health care provider.   Document Released: 01/09/2008 Document Revised: 07/28/2013 Document Reviewed: 10/08/2011 Elsevier Interactive Patient Education [employer].

## 2015-12-02 NOTE — ED Notes (Signed)
C/o audible wheezing and productive cough with yellow phlegm x 1 week.  Using albuterol inhaler without relief.

## 2015-12-02 NOTE — ED Provider Notes (Signed)
CSN: [id number]     Arrival date & time 12/02/15  1953 History  By signing my name below, I, Sherri Shaw, attest that this documentation has been prepared under the direction and in the presence of [address] [employer]. Electronically Signed: Eustaquio Shaw, ED Scribe. 12/02/2015. 8:42 PM.   Chief Complaint  Patient presents with  . Wheezing   Patient is a 35 y.o. female presenting with wheezing. The history is provided by the patient and medical records. No language interpreter was used.  Wheezing Severity:  Moderate Severity compared to prior episodes:  Similar Onset quality:  Gradual Duration:  1 week Timing:  Constant Progression:  Unchanged Chronicity:  Recurrent Relieved by:  Nothing Worsened by:  Nothing tried Ineffective treatments:  Beta-agonist inhaler Associated symptoms: cough, shortness of breath and sputum production   Associated symptoms: no chest pain, no ear pain, no fever, no rhinorrhea and no sore throat   Risk factors: prior hospitalizations   Risk factors: no prior intubations      HPI Comments: Sherri Shaw is a 35 y.o. female with PMHx of chronic anemia, bipolar disorder, schizophrenia, and chronic Asthma, who presents to the Emergency Department complaining of gradual onset, constant, productive cough with yellow phlegm x 1 week. Pt also complains of wheezing and shortness of breath. She has hx of asthma and states this feels similar to previous exacerbations. Pt has been using her albuterol inhaler without relief. She reports that Prednisone has worked in the past with previous exacerbations. Most recent exacerbation was 10/06/15 when she needed to be hospitalized for her asthma attack. No recent sick contact with similar symptoms. No recent surgery, prolonged travel, or immobilization. No personal or known family Hx of DVT/PE. Denies nasal congestion, rhinorrhea, ear pain, ear drainage, sore throat, fever, chills, chest pain, hemoptysis, leg swelling, estrogen  use, abdominal pain, nausea, vomiting, diarrhea, constipation, dysuria, hematuria, weakness, numbness, tingling, body aches, or any other associated symptoms. No prior intubations for her asthma.    Past Medical History  Diagnosis Date  . Asthma   . Chronic bronchitis ([place])     "I get it q yr" (04/07/2013)  . Anemia   . History of blood transfusion 04/07/2013    "got my first transfusion today; got 4 units" (04/07/2013)  . Bipolar disorder [facility])     sister denies this hx on 04/07/2013  . Schizophrenia ([place])     "don't really know the type"/sister (04/07/2013)  . Microcytic anemia     Archie Endo 04/07/2013  . Exertional shortness of breath     ? due to anemia   Past Surgical History  Procedure Laterality Date  . No past surgeries    . Abdominal hysterectomy N/A 06/01/2013    Procedure: HYSTERECTOMY ABDOMINAL;  Surgeon: Lavonia Drafts, MD;  Location: [facility];  Service: Gynecology;  Laterality: N/A;  . Bilateral salpingectomy Bilateral 06/01/2013    Procedure: BILATERAL SALPINGECTOMY;  Surgeon: Lavonia Drafts, MD;  Location: [place] [facility];  Service: Gynecology;  Laterality: Bilateral;  . Cystoscopy w/ ureteral stent placement Bilateral 06/01/2013    Procedure: CYSTOSCOPY WITH BILATERAL STENT REPLACEMENT;  Surgeon: Irine Seal, MD;  Location: [facility];  Service: Urology;  Laterality: Bilateral;   Family History  Problem Relation Age of Onset  . Hypertension Mother   . Diabetes Mother   . Asthma Mother   . Stroke Mother   . Cancer Mother   . Asthma Brother   . Heart disease Maternal Aunt    Social History  Substance Use Topics  .  Smoking status: Never Smoker   . Smokeless tobacco: Never Used  . Alcohol Use: No   OB History    Gravida Para Term Preterm AB TAB SAB Ectopic Multiple Living   0 0 0 0 0 0 0 0 0 0      Review of Systems  Constitutional: Negative for fever and chills.  HENT: Negative for congestion, ear discharge, ear pain, rhinorrhea and sore throat.   Respiratory:  Positive for cough, sputum production, shortness of breath and wheezing.        No hemoptysis  Cardiovascular: Negative for chest pain and leg swelling.  Gastrointestinal: Negative for nausea, vomiting, abdominal pain, diarrhea and constipation.  Genitourinary: Negative for dysuria and hematuria.  Musculoskeletal: Negative for myalgias and arthralgias.  Allergic/Immunologic: Negative for immunocompromised state.  Neurological: Negative for weakness and numbness.  Psychiatric/Behavioral: Negative for confusion.   A complete 10 system review of systems was obtained and all systems are negative except as noted in the HPI and PMH.    Allergies  Review of patient's allergies indicates no known allergies.  Home Medications   Prior to Admission medications   Medication Sig Start Date End Date Taking? Authorizing Provider  albuterol (PROVENTIL HFA;VENTOLIN HFA) 108 (90 BASE) MCG/ACT inhaler Inhale 2 puffs into the lungs every 6 (six) hours as needed for wheezing or shortness of breath. 09/06/14   Juliet Rude, MD  ergocalciferol (VITAMIN D2) 50000 units capsule Take 1 capsule (50,000 Units total) by mouth once a week. 10/14/15   Argentina Donovan, PA-C  predniSONE (DELTASONE) 50 MG tablet Take 1 tablet by mouth daily for the next 4 days 10/08/15   Velvet Bathe, MD   BP 132/84 mmHg  Pulse 86  Temp(Src) 98.9 F (37.2 C) (Oral)  Resp 20  SpO2 94%  LMP 05/13/2013   Physical Exam  Constitutional: She is oriented to person, place, and time. Vital signs are normal. She appears well-developed and well-nourished.  Non-toxic appearance. No distress.  Afebrile, nontoxic, NAD  HENT:  Head: Normocephalic and atraumatic.  Mouth/Throat: Oropharynx is clear and moist and mucous membranes are normal.  Eyes: Conjunctivae and EOM are normal. Right eye exhibits no discharge. Left eye exhibits no discharge.  Neck: Normal range of motion. Neck supple.  Cardiovascular: Normal rate, regular rhythm, normal heart  sounds and intact distal pulses.  Exam reveals no gallop and no friction rub.   No murmur heard. Pulmonary/Chest: Effort normal. No respiratory distress. She has no decreased breath sounds. She has wheezes. She has rhonchi. She has no rales.  Diffuse expiratory wheezing in all lung fields with rhonchorous sounds throughout, no rales, no hypoxia or increased WOB, speaking in full sentences, SpO2 94% on RA  Abdominal: Soft. Normal appearance and bowel sounds are normal. She exhibits no distension. There is no tenderness. There is no rigidity, no rebound, no guarding, no CVA tenderness, no tenderness at McBurney's point and negative Murphy's sign.  Musculoskeletal: Normal range of motion.  MAE x4 Strength and sensation grossly intact Distal pulses intact Gait steady No pedal edema, neg homan's bilaterally  Neurological: She is alert and oriented to person, place, and time. She has normal strength. No sensory deficit.  Skin: Skin is warm, dry and intact. No rash noted.  Psychiatric: She has a normal mood and affect.  Nursing note and vitals reviewed.   ED Course  Procedures (including critical care time)  DIAGNOSTIC STUDIES: Oxygen Saturation is 94% on RA, adequate by my interpretation.    COORDINATION OF  CARE: 8:41 PM-Discussed treatment plan which includes CXR with pt at bedside and pt agreed to plan.   Labs Review Labs Reviewed - No data to display  Imaging Review Dg Chest 2 View  12/02/2015  CLINICAL DATA:  Wheezing shortness of breath and cough. Evaluate for pneumonia. EXAM: CHEST  2 VIEW COMPARISON:  10/06/2015 FINDINGS: Normal heart size and mediastinal contours. No acute infiltrate or edema. No effusion or pneumothorax. No osseous findings. IMPRESSION: Stable and negative chest. Electronically Signed   By: Monte Fantasia M.D.   On: 12/02/2015 21:28   I have personally reviewed and evaluated these images as part of my medical decision-making.   EKG Interpretation None       MDM   Final diagnoses:  Asthma exacerbation attacks, moderate persistent  Cough  Wheezing  SOB (shortness of breath)    35 y.o. female here with asthma exacerbation symptoms x1 wk. Cough with yellow sputum, wheezing, and SOB, states it feels like prior asthma attacks. She was recently admitted for asthma exacerbation on 10/06/15. No known sick contacts, no URI symptoms. No hemoptysis, hypoxia, tachycardia, or LE swelling, and no RFs for DVT/PE, doubt this as an etiology of her SOB. No CP complaint. On exam, diffuse expiratory wheezing and rhonchorous sounds, was given albuterol neb tx prior to eval with minimal improvement, will do duoneb tx and give prednisone now to see if we can improve symptoms. Will attempt up to 2 more additional duonebs, and potentially CAT tx if necessary to improve her symptoms. If CAT is necessary, will consider getting labs in case we need to admit. Will get CXR now to r/o PNA or other possible changes. Will reassess after neb tx and CXR  9:39 PM CXR neg for acute findings or PNA. Lung sounds somewhat improved after nebs but still wheezing, will repeat neb tx. Pt feeling slight improvement at this time. She will likely go home with albuterol inhaler, prednisone taper x3 days starting 4/29, and advised pt to use antihistamines and mucinex. Will reassess after next neb tx.  10:20 PM Some improvement in lung sounds, will do one more neb tx and then likely d/c home  10:47 PM Lung sounds and symptoms improved. Will send home with albuterol inhaler since pt is uninsured and these can be expensive. D/c with previously outlined plan as above. F/up with PCP in 5-7 days. I explained the diagnosis and have given explicit precautions to return to the ER including for any other new or worsening symptoms. The patient understands and accepts the medical plan as it's been dictated and I have answered their questions. Discharge instructions concerning home care and prescriptions have  been given. The patient is STABLE and is discharged to home in good condition.  I personally performed the services described in this documentation, which was scribed in my presence. The recorded information has been reviewed and is accurate.  BP 132/84 mmHg  Pulse 86  Temp(Src) 98.9 F (37.2 C) (Oral)  Resp 20  SpO2 94%  LMP 05/13/2013  Meds ordered this encounter  Medications  . albuterol (PROVENTIL) (2.5 MG/3ML) 0.083% nebulizer solution 5 mg    Sig:   . albuterol (PROVENTIL) (2.5 MG/3ML) 0.083% nebulizer solution    Sig:     Newnam, Leana   : cabinet override  . predniSONE (DELTASONE) tablet 60 mg    Sig:   . ipratropium (ATROVENT) nebulizer solution 0.5 mg    Sig:   . albuterol (PROVENTIL) (2.5 MG/3ML) 0.083% nebulizer solution 5 mg  Sig:   . albuterol (PROVENTIL) (2.5 MG/3ML) 0.083% nebulizer solution 5 mg    Sig:   . ipratropium (ATROVENT) nebulizer solution 0.5 mg    Sig:   . albuterol (PROVENTIL) (2.5 MG/3ML) 0.083% nebulizer solution 5 mg    Sig:   . ipratropium (ATROVENT) nebulizer solution 0.5 mg    Sig:   . predniSONE (DELTASONE) 20 MG tablet    Sig: 3 tabs po daily x 3 days starting on 12/03/15    Dispense:  9 tablet    Refill:  0    Order Specific Question:  Supervising Provider    Answer:  Noemi Chapel [[id number]]  . albuterol (PROVENTIL HFA;VENTOLIN HFA) 108 (90 Base) MCG/ACT inhaler    Sig: Inhale 2 puffs into the lungs every 4 (four) hours as needed for wheezing or shortness of breath (cough).    Dispense:  1 Inhaler    Refill:  0    Order Specific Question:  Supervising Provider    Answer:  MILLER, BRIAN [[id number]]  . albuterol (PROVENTIL HFA;VENTOLIN HFA) 108 (90 Base) MCG/ACT inhaler 2 puff    Sig:          Pauline Trainer Camprubi-Soms, PA-C 12/02/15 [place], MD 12/03/15 [phone]

## 2016-01-06 ENCOUNTER — Encounter (HOSPITAL_COMMUNITY): Payer: Self-pay | Admitting: Emergency Medicine

## 2016-01-06 DIAGNOSIS — Z79899 Other long term (current) drug therapy: Secondary | ICD-10-CM | POA: Insufficient documentation

## 2016-01-06 DIAGNOSIS — Z8659 Personal history of other mental and behavioral disorders: Secondary | ICD-10-CM | POA: Diagnosis not present

## 2016-01-06 DIAGNOSIS — Z862 Personal history of diseases of the blood and blood-forming organs and certain disorders involving the immune mechanism: Secondary | ICD-10-CM | POA: Insufficient documentation

## 2016-01-06 DIAGNOSIS — R0602 Shortness of breath: Secondary | ICD-10-CM | POA: Diagnosis present

## 2016-01-06 DIAGNOSIS — J45901 Unspecified asthma with (acute) exacerbation: Secondary | ICD-10-CM | POA: Diagnosis not present

## 2016-01-06 MED ORDER — ALBUTEROL SULFATE (2.5 MG/3ML) 0.083% IN NEBU
5.0000 mg | INHALATION_SOLUTION | Freq: Once | RESPIRATORY_TRACT | Status: AC
  Administered 2016-01-06: 5 mg via RESPIRATORY_TRACT

## 2016-01-06 MED ORDER — ALBUTEROL SULFATE (2.5 MG/3ML) 0.083% IN NEBU
INHALATION_SOLUTION | RESPIRATORY_TRACT | Status: AC
  Filled 2016-01-06: qty 6

## 2016-01-06 NOTE — ED Notes (Signed)
C/o asthma flare-up since 6pm whiile at work.  States there is no A/C at work and it was approx 80 degrees and she thinks it flared her asthma up.  Used inhaler without relief.

## 2016-01-07 ENCOUNTER — Emergency Department (HOSPITAL_COMMUNITY)
Admission: EM | Admit: 2016-01-07 | Discharge: 2016-01-07 | Disposition: A | Discharge status: Home / Self Care | Payer: Medicaid Other | Attending: Emergency Medicine | Admitting: Emergency Medicine

## 2016-01-07 ENCOUNTER — Telehealth: Payer: Self-pay | Admitting: *Deleted

## 2016-01-07 ENCOUNTER — Emergency Department (HOSPITAL_COMMUNITY): Payer: Medicaid Other

## 2016-01-07 DIAGNOSIS — J45901 Unspecified asthma with (acute) exacerbation: Secondary | ICD-10-CM

## 2016-01-07 LAB — CBC WITH DIFFERENTIAL/PLATELET
BASOS ABS: 0 10*3/uL (ref 0.0–0.1)
BASOS PCT: 0 %
EOS PCT: 5 %
Eosinophils Absolute: 0.7 10*3/uL (ref 0.0–0.7)
HCT: 40.4 % (ref 36.0–46.0)
Hemoglobin: 12.7 g/dL (ref 12.0–15.0)
Lymphocytes Relative: 20 %
Lymphs Abs: 2.6 10*3/uL (ref 0.7–4.0)
MCH: 28.3 pg (ref 26.0–34.0)
MCHC: 31.4 g/dL (ref 30.0–36.0)
MCV: 90.2 fL (ref 78.0–100.0)
MONO ABS: 1.2 10*3/uL — AB (ref 0.1–1.0)
Monocytes Relative: 9 %
NEUTROS ABS: 8.5 10*3/uL — AB (ref 1.7–7.7)
Neutrophils Relative %: 66 %
PLATELETS: 289 10*3/uL (ref 150–400)
RBC: 4.48 MIL/uL (ref 3.87–5.11)
RDW: 12.5 % (ref 11.5–15.5)
WBC: 12.9 10*3/uL — AB (ref 4.0–10.5)

## 2016-01-07 LAB — BASIC METABOLIC PANEL
ANION GAP: 7 (ref 5–15)
BUN: 11 mg/dL (ref 6–20)
CALCIUM: 9.1 mg/dL (ref 8.9–10.3)
CO2: 28 mmol/L (ref 22–32)
Chloride: 101 mmol/L (ref 101–111)
Creatinine, Ser: 0.75 mg/dL (ref 0.44–1.00)
Glucose, Bld: 96 mg/dL (ref 65–99)
Potassium: 3.9 mmol/L (ref 3.5–5.1)
Sodium: 136 mmol/L (ref 135–145)

## 2016-01-07 MED ORDER — ALBUTEROL (5 MG/ML) CONTINUOUS INHALATION SOLN
10.0000 mg/h | INHALATION_SOLUTION | Freq: Once | RESPIRATORY_TRACT | Status: AC
  Administered 2016-01-07: 10 mg/h via RESPIRATORY_TRACT
  Filled 2016-01-07: qty 20

## 2016-01-07 MED ORDER — PREDNISONE 10 MG PO TABS: 20.0000 mg | ORAL_TABLET | Freq: Every day | ORAL | Status: DC

## 2016-01-07 MED ORDER — IPRATROPIUM BROMIDE 0.02 % IN SOLN
0.5000 mg | Freq: Once | RESPIRATORY_TRACT | Status: AC
  Administered 2016-01-07: 0.5 mg via RESPIRATORY_TRACT
  Filled 2016-01-07: qty 2.5

## 2016-01-07 MED ORDER — ALBUTEROL SULFATE (2.5 MG/3ML) 0.083% IN NEBU
5.0000 mg | INHALATION_SOLUTION | Freq: Once | RESPIRATORY_TRACT | Status: AC
  Administered 2016-01-07: 5 mg via RESPIRATORY_TRACT
  Filled 2016-01-07: qty 6

## 2016-01-07 MED ORDER — METHYLPREDNISOLONE SODIUM SUCC 125 MG IJ SOLR
125.0000 mg | Freq: Once | INTRAMUSCULAR | Status: AC
Start: 2016-01-07 — End: 2016-01-07
  Administered 2016-01-07: 125 mg via INTRAVENOUS
  Filled 2016-01-07: qty 2

## 2016-01-07 MED ORDER — ALBUTEROL SULFATE HFA 108 (90 BASE) MCG/ACT IN AERS
2.0000 | INHALATION_SPRAY | RESPIRATORY_TRACT | Status: DC | PRN
  Administered 2016-01-07: 2 via RESPIRATORY_TRACT
  Filled 2016-01-07: qty 6.7

## 2016-01-07 NOTE — Telephone Encounter (Signed)
Reviewed chart and prescriptions received this am for this 35 y.o F seen in the ED this AM. Pt was treated for Asthma and given a RX for Deltasone 20 mg with 2 different directions, 1) take two tabs and 2) 5,5,4,4,3,3,2,2,1,1. #21. After reading the note which clearly states " 5 day burst" Pharmacist and RN discerned pt should receive later of the two directions . Prescriptions changed to read dose Pak. No further CM needs.

## 2016-01-07 NOTE — ED Provider Notes (Signed)
CSN: [id number]     Arrival date & time 01/06/16  2209 History   First MD Initiated Contact with Patient 01/07/16 0056     No chief complaint on file.    (Consider location/radiation/quality/duration/timing/severity/associated sxs/prior Treatment) HPI   A shunt has a past medical history of chronic bronchitis, asthma, anemia, history of blood transfusion, bipolar disorder, schizophrenia, microcytic anemia, exertional shortness of breath comes to the emergency department for evaluation of asthma exacerbation. She says that she has had to be admitted for this before but denies she's ever needed to be admitted. She states that she has had asthma much worse than this. She states that her asthma gets flared when it is hot outside. Being at work today when they would not turn on the before meals is approximately 80 and side causing her asthma flareup. She used her albuterol inhaler at home multiple times but it did not help. Now she's having significant shortness of breath with wheezing her symptoms started around 6 PM. She has had mild cough. She denies having any lower extremity swelling, chest pain, back pain, coughing, headache, ear pain, throat pain, nasal congestion, weakness, nausea, vomiting, diarrhea, rash.  Past Medical History  Diagnosis Date  . Asthma   . Chronic bronchitis ([place])     "I get it q yr" (04/07/2013)  . Anemia   . History of blood transfusion 04/07/2013    "got my first transfusion today; got 4 units" (04/07/2013)  . Bipolar disorder [facility])     sister denies this hx on 04/07/2013  . Schizophrenia ([place])     "don't really know the type"/sister (04/07/2013)  . Microcytic anemia     Archie Endo 04/07/2013  . Exertional shortness of breath     ? due to anemia   Past Surgical History  Procedure Laterality Date  . No past surgeries    . Abdominal hysterectomy N/A 06/01/2013    Procedure: HYSTERECTOMY ABDOMINAL;  Surgeon: Lavonia Drafts, MD;  Location: [facility];  Service: Gynecology;   Laterality: N/A;  . Bilateral salpingectomy Bilateral 06/01/2013    Procedure: BILATERAL SALPINGECTOMY;  Surgeon: Lavonia Drafts, MD;  Location: [facility];  Service: Gynecology;  Laterality: Bilateral;  . Cystoscopy w/ ureteral stent placement Bilateral 06/01/2013    Procedure: CYSTOSCOPY WITH BILATERAL STENT REPLACEMENT;  Surgeon: Irine Seal, MD;  Location: [place];  Service: Urology;  Laterality: Bilateral;   Family History  Problem Relation Age of Onset  . Hypertension Mother   . Diabetes Mother   . Asthma Mother   . Stroke Mother   . Cancer Mother   . Asthma Brother   . Heart disease Maternal Aunt    Social History  Substance Use Topics  . Smoking status: Never Smoker   . Smokeless tobacco: Never Used  . Alcohol Use: No   OB History    Gravida Para Term Preterm AB TAB SAB Ectopic Multiple Living   0 0 0 0 0 0 0 0 0 0      Review of Systems  Review of Systems All other systems negative except as documented in the HPI. All pertinent positives and negatives as reviewed in the HPI.   Allergies  Review of patient's allergies indicates no known allergies.  Home Medications   Prior to Admission medications   Medication Sig Start Date End Date Taking? Authorizing Provider  albuterol (PROVENTIL HFA;VENTOLIN HFA) 108 (90 Base) MCG/ACT inhaler Inhale 2 puffs into the lungs every 4 (four) hours as needed for wheezing or shortness of breath (  cough). 12/02/15  Yes Mercedes Camprubi-Soms, PA-C  ergocalciferol (VITAMIN D2) 50000 units capsule Take 1 capsule (50,000 Units total) by mouth once a week. 10/14/15  Yes Dionne Bucy McClung, PA-C  predniSONE (DELTASONE) 10 MG tablet Take 2 tablets (20 mg total) by mouth daily. 01/07/16   Kenidee Cregan Carlota Raspberry, PA-C   BP 101/58 mmHg  Pulse 105  Temp(Src) 98 F (36.7 C) (Oral)  Resp 17  SpO2 93%  LMP 05/13/2013 Physical Exam  Constitutional: She appears well-developed and well-nourished. No distress.  HENT:  Head: Normocephalic and atraumatic.   Right Ear: Tympanic membrane and ear canal normal.  Left Ear: Tympanic membrane and ear canal normal.  Nose: Nose normal.  Mouth/Throat: Uvula is midline, oropharynx is clear and moist and mucous membranes are normal.  Eyes: Pupils are equal, round, and reactive to light.  Neck: Normal range of motion. Neck supple.  Cardiovascular: Normal rate and regular rhythm.   Pulmonary/Chest: Accessory muscle usage present. No tachypnea and no bradypnea. No respiratory distress. She has wheezes (moderate to severe diffuse wheezing). She has no rhonchi. She has no rales.  Abdominal: Soft.  No signs of abdominal distention  Musculoskeletal:  No LE swelling  Neurological: She is alert.  Acting at baseline  Skin: Skin is warm and dry. No rash noted.  Nursing note and vitals reviewed.   ED Course  Procedures (including critical care time) Labs Review Labs Reviewed  CBC WITH DIFFERENTIAL/PLATELET - Abnormal; Notable for the following:    WBC 12.9 (*)    Neutro Abs 8.5 (*)    Monocytes Absolute 1.2 (*)    All other components within normal limits  BASIC METABOLIC PANEL    Imaging Review Dg Chest 2 View  01/07/2016  CLINICAL DATA:  Shortness of breath and wheezing. EXAM: CHEST  2 VIEW COMPARISON:  12/02/2015 FINDINGS: Normal heart size and mediastinal contours. No infiltrate or edema. No effusion or pneumothorax. No acute osseous findings. IMPRESSION: Negative chest. Electronically Signed   By: Monte Fantasia M.D.   On: 01/07/2016 04:26   I have personally reviewed and evaluated these images and lab results as part of my medical decision-making.   EKG Interpretation None      MDM   Final diagnoses:  Asthma exacerbation   CBC and BMP unremarkable. Chest xray also normal.  CRITICAL CARE Performed by: Linus Mako Total critical care time: 35 minutes Critical care time was exclusive of separately billable procedures and treating other patients. Critical care was necessary to  treat or prevent imminent or life-threatening deterioration. Critical care was time spent personally by me on the following activities: development of treatment plan with patient and/or surrogate as well as nursing, discussions with consultants, evaluation of patient's response to treatment, examination of patient, obtaining history from patient or surrogate, ordering and performing treatments and interventions, ordering and review of laboratory studies, ordering and review of radiographic studies, pulse oximetry and re-evaluation of patient's condition.  Medications  albuterol (PROVENTIL) (2.5 MG/3ML) 0.083% nebulizer solution (not administered)  albuterol (PROVENTIL HFA;VENTOLIN HFA) 108 (90 Base) MCG/ACT inhaler 2 puff (not administered)  albuterol (PROVENTIL) (2.5 MG/3ML) 0.083% nebulizer solution 5 mg (5 mg Nebulization Given 01/06/16 2220)  albuterol (PROVENTIL,VENTOLIN) solution continuous neb (10 mg/hr Nebulization Given 01/07/16 0218)  ipratropium (ATROVENT) nebulizer solution 0.5 mg (0.5 mg Nebulization Given 01/07/16 0218)  methylPREDNISolone sodium succinate (SOLU-MEDROL) 125 mg/2 mL injection 125 mg (125 mg Intravenous Given 01/07/16 0225)  albuterol (PROVENTIL) (2.5 MG/3ML) 0.083% nebulizer solution 5 mg (5 mg Nebulization  Given 01/07/16 0358)  ipratropium (ATROVENT) nebulizer solution 0.5 mg (0.5 mg Nebulization Given 01/07/16 0358)    Patient required multiple rounds of nebulizer treatments, including a continue hour long nebulizer and solumedrol. She was then monitored and showed significant improvement. She was able to walk without increased SOB or wheezing as well as maintained her O2 sats.  Patient ambulated in ED with O2 saturations maintained >90, no current signs of respiratory distress. Lung exam improved after nebulizer treatment. Prednisone given in the ED and pt will bd dc with 5 day burst. Pt states they are breathing at baseline. Pt has been instructed to continue using prescribed  medications and to speak with PCP about today's exacerbation.   Advised to come back to the ED as need if symptoms worsen or fail to improve.    Delos Haring, PA-C 01/07/16 [id number]  Ripley Fraise, MD 01/07/16 0700

## 2016-01-07 NOTE — ED Notes (Signed)
Ambulated PT. PT's initial SpO2 was 94% and pulse 94. While ambulating, the Pt's SpO2 stayed around 94%. The SpO2 momentarily dropped to 92% and momentarily went up to 96%. The Pt's pulse was between 110-120 bpm. Pt did not feel dizzy.

## 2016-01-07 NOTE — Discharge Instructions (Signed)
Asthma, Adult Asthma is a condition of the lungs in which the airways tighten and narrow. Asthma can make it hard to breathe. Asthma cannot be cured, but medicine and lifestyle changes can help control it. Asthma may be started (triggered) by:  Animal skin flakes (dander).  Dust.  Cockroaches.  Pollen.  Mold.  Smoke.  Cleaning products.  Hair sprays or aerosol sprays.  Paint fumes or strong smells.  Cold air, weather changes, and winds.  Crying or laughing hard.  Stress.  Certain medicines or drugs.  Foods, such as dried fruit, potato chips, and sparkling grape juice.  Infections or conditions (colds, flu).  Exercise.  Certain medical conditions or diseases.  Exercise or tiring activities. HOME CARE   Take medicine as told by your doctor.  Use a peak flow meter as told by your doctor. A peak flow meter is a tool that measures how well the lungs are working.  Record and keep track of the peak flow meter's readings.  Understand and use the asthma action plan. An asthma action plan is a written plan for taking care of your asthma and treating your attacks.  To help prevent asthma attacks:  Do not smoke. Stay away from secondhand smoke.  Change your heating and air conditioning filter often.  Limit your use of fireplaces and wood stoves.  Get rid of pests (such as roaches and mice) and their droppings.  Throw away plants if you see mold on them.  Clean your floors. Dust regularly. Use cleaning products that do not smell.  Have someone vacuum when you are not home. Use a vacuum cleaner with a HEPA filter if possible.  Replace carpet with wood, tile, or vinyl flooring. Carpet can trap animal skin flakes and dust.  Use allergy-proof pillows, mattress covers, and box spring covers.  Wash bed sheets and blankets every week in hot water and dry them in a dryer.  Use blankets that are made of polyester or cotton.  Clean bathrooms and kitchens with bleach.  If possible, have someone repaint the walls in these rooms with mold-resistant paint. Keep out of the rooms that are being cleaned and painted.  Wash hands often. GET HELP IF:  You have make a whistling sound when breaking (wheeze), have shortness of breath, or have a cough even if taking medicine to prevent attacks.  The colored mucus you cough up (sputum) is thicker than usual.  The colored mucus you cough up changes from clear or white to yellow, green, gray, or bloody.  You have problems from the medicine you are taking such as:  A rash.  Itching.  Swelling.  Trouble breathing.  You need reliever medicines more than 2-3 times a week.  Your peak flow measurement is still at 50-79% of your personal best after following the action plan for 1 hour.  You have a fever. GET HELP RIGHT AWAY IF:   You seem to be worse and are not responding to medicine during an asthma attack.  You are short of breath even at rest.  You get short of breath when doing very little activity.  You have trouble eating, drinking, or talking.  You have chest pain.  You have a fast heartbeat.  Your lips or fingernails start to turn blue.  You are light-headed, dizzy, or faint.  Your peak flow is less than 50% of your personal best.   This information is not intended to replace advice given to you by your health care provider. Make sure   you discuss any questions you have with your health care provider.   Document Released: 01/09/2008 Document Revised: 04/13/2015 Document Reviewed: 02/19/2013 Elsevier Interactive Patient Education 2016 [employer]  

## 2016-04-13 ENCOUNTER — Encounter (HOSPITAL_COMMUNITY): Payer: Self-pay | Admitting: Emergency Medicine

## 2016-04-13 ENCOUNTER — Emergency Department (HOSPITAL_COMMUNITY): Payer: Medicaid Other

## 2016-04-13 DIAGNOSIS — J45901 Unspecified asthma with (acute) exacerbation: Secondary | ICD-10-CM | POA: Insufficient documentation

## 2016-04-13 DIAGNOSIS — J45909 Unspecified asthma, uncomplicated: Secondary | ICD-10-CM | POA: Diagnosis present

## 2016-04-13 DIAGNOSIS — Z79899 Other long term (current) drug therapy: Secondary | ICD-10-CM | POA: Insufficient documentation

## 2016-04-13 MED ORDER — ALBUTEROL SULFATE (2.5 MG/3ML) 0.083% IN NEBU
2.5000 mg | INHALATION_SOLUTION | Freq: Once | RESPIRATORY_TRACT | Status: AC
  Administered 2016-04-13: 2.5 mg via RESPIRATORY_TRACT

## 2016-04-13 MED ORDER — ALBUTEROL SULFATE (2.5 MG/3ML) 0.083% IN NEBU
INHALATION_SOLUTION | RESPIRATORY_TRACT | Status: AC
  Filled 2016-04-13: qty 3

## 2016-04-13 NOTE — ED Triage Notes (Signed)
Pt st's she has been having problems with her asthma all day.  Pt st's she used her inhaler but did not get any relief.  Pt unable to speak in full sentences   HHN given in triage

## 2016-04-14 ENCOUNTER — Emergency Department (HOSPITAL_COMMUNITY)
Admission: EM | Admit: 2016-04-14 | Discharge: 2016-04-14 | Disposition: A | Discharge status: Home / Self Care | Payer: Medicaid Other | Attending: Emergency Medicine | Admitting: Emergency Medicine

## 2016-04-14 DIAGNOSIS — J45901 Unspecified asthma with (acute) exacerbation: Secondary | ICD-10-CM

## 2016-04-14 LAB — COMPREHENSIVE METABOLIC PANEL
ALBUMIN: 3.7 g/dL (ref 3.5–5.0)
ALK PHOS: 49 U/L (ref 38–126)
ALT: 25 U/L (ref 14–54)
AST: 37 U/L (ref 15–41)
Anion gap: 10 (ref 5–15)
BILIRUBIN TOTAL: 0.2 mg/dL — AB (ref 0.3–1.2)
BUN: 8 mg/dL (ref 6–20)
CALCIUM: 8.5 mg/dL — AB (ref 8.9–10.3)
CO2: 20 mmol/L — ABNORMAL LOW (ref 22–32)
Chloride: 106 mmol/L (ref 101–111)
Creatinine, Ser: 0.64 mg/dL (ref 0.44–1.00)
GFR calc Af Amer: >60 mL/min (ref >60)
GFR calc non Af Amer: >60 mL/min (ref >60)
GLUCOSE: 134 mg/dL — AB (ref 65–99)
Potassium: 3.9 mmol/L (ref 3.5–5.1)
Sodium: 136 mmol/L (ref 135–145)
TOTAL PROTEIN: 7.1 g/dL (ref 6.5–8.1)

## 2016-04-14 LAB — CBC
HEMATOCRIT: 37.5 % (ref 36.0–46.0)
HEMOGLOBIN: 11.8 g/dL — AB (ref 12.0–15.0)
MCH: 28.4 pg (ref 26.0–34.0)
MCHC: 31.5 g/dL (ref 30.0–36.0)
MCV: 90.1 fL (ref 78.0–100.0)
Platelets: 365 10*3/uL (ref 150–400)
RBC: 4.16 MIL/uL (ref 3.87–5.11)
RDW: 12.5 % (ref 11.5–15.5)
WBC: 12.4 10*3/uL — AB (ref 4.0–10.5)

## 2016-04-14 MED ORDER — ALBUTEROL SULFATE HFA 108 (90 BASE) MCG/ACT IN AERS
2.0000 | INHALATION_SPRAY | RESPIRATORY_TRACT | Status: DC
  Administered 2016-04-14: 2 via RESPIRATORY_TRACT
  Filled 2016-04-14: qty 6.7

## 2016-04-14 MED ORDER — IPRATROPIUM BROMIDE 0.02 % IN SOLN
0.5000 mg | Freq: Once | RESPIRATORY_TRACT | Status: AC
  Administered 2016-04-14: 0.5 mg via RESPIRATORY_TRACT
  Filled 2016-04-14: qty 2.5

## 2016-04-14 MED ORDER — METHYLPREDNISOLONE SODIUM SUCC 125 MG IJ SOLR
125.0000 mg | Freq: Once | INTRAMUSCULAR | Status: AC
  Administered 2016-04-14: 125 mg via INTRAVENOUS
  Filled 2016-04-14: qty 2

## 2016-04-14 MED ORDER — PREDNISONE 10 MG PO TABS: 60.0000 mg | ORAL_TABLET | Freq: Every day | ORAL | 0 refills | Status: DC

## 2016-04-14 MED ORDER — ALBUTEROL SULFATE HFA 108 (90 BASE) MCG/ACT IN AERS: 2.0000 | INHALATION_SPRAY | RESPIRATORY_TRACT | 0 refills | Status: DC | PRN

## 2016-04-14 MED ORDER — ALBUTEROL SULFATE (2.5 MG/3ML) 0.083% IN NEBU
10.0000 mg | INHALATION_SOLUTION | Freq: Once | RESPIRATORY_TRACT | Status: AC
  Administered 2016-04-14: 10 mg via RESPIRATORY_TRACT
  Filled 2016-04-14: qty 12

## 2016-04-14 MED ORDER — ALBUTEROL SULFATE (2.5 MG/3ML) 0.083% IN NEBU
5.0000 mg | INHALATION_SOLUTION | Freq: Once | RESPIRATORY_TRACT | Status: AC
  Administered 2016-04-14: 5 mg via RESPIRATORY_TRACT
  Filled 2016-04-14: qty 6

## 2016-04-14 NOTE — ED Provider Notes (Addendum)
[facility] DEPT Provider Note   CSN: [id number] Arrival date & time: 04/13/16  2046  By signing my name below, I, Arianna Nassar, attest that this documentation has been prepared under the direction and in the presence of Jola Schmidt, MD.  Electronically Signed: Julien Nordmann, ED Scribe. 04/14/16. 12:32 AM.   History   Chief Complaint Chief Complaint  Patient presents with  . Asthma     The history is provided by the patient. No language interpreter was used.   HPI Comments: Sherri Shaw is a 35 y.o. female who has a PMhx of asthma, chronic bronchitis, microcytic anemia presents to the Emergency Department complaining of constant, gradual worsening, moderate, shortness of breath onset 3-4 weeks ago. She has associated wheezing and productive cough that brings up yellow sputum. Pt states she currently has a cold and notes that when she has one her asthma flares up. She notes using her albuterol treatment to alleviate her symptoms with no relief and reports she has been out for a couple of days. Pt denies fever.   3:53 AM Pt says that her breathing has gotten better since the nebulizer treatment. She notes she would like one more treatment before going home. Pt also requests a work note for tomorrow.  Past Medical History:  Diagnosis Date  . Anemia   . Asthma   . Bipolar disorder [facility])    sister denies this hx on 04/07/2013  . Chronic bronchitis ([place])    "I get it q yr" (04/07/2013)  . Exertional shortness of breath    ? due to anemia  . History of blood transfusion 04/07/2013   "got my first transfusion today; got 4 units" (04/07/2013)  . Microcytic anemia    Archie Endo 04/07/2013  . Schizophrenia ([place])    "don't really know the type"/sister (04/07/2013)    Patient Active Problem List   Diagnosis Date Noted  . Asthma exacerbation 09/03/2014  . Schizo-affective schizophrenia ([place]) 04/10/2013  . Microcytic anemia 04/07/2013  . Uterine mass 04/07/2013  . Trichomonas 04/07/2013  .  Bipolar disorder ([place]) 04/07/2013  . Bilateral lower extremity edema 04/07/2013    Past Surgical History:  Procedure Laterality Date  . ABDOMINAL HYSTERECTOMY N/A 06/01/2013   Procedure: HYSTERECTOMY ABDOMINAL;  Surgeon: Lavonia Drafts, MD;  Location: [facility];  Service: Gynecology;  Laterality: N/A;  . BILATERAL SALPINGECTOMY Bilateral 06/01/2013   Procedure: BILATERAL SALPINGECTOMY;  Surgeon: Lavonia Drafts, MD;  Location: [facility];  Service: Gynecology;  Laterality: Bilateral;  . CYSTOSCOPY W/ URETERAL STENT PLACEMENT Bilateral 06/01/2013   Procedure: CYSTOSCOPY WITH BILATERAL STENT REPLACEMENT;  Surgeon: Irine Seal, MD;  Location: [facility];  Service: Urology;  Laterality: Bilateral;  . NO PAST SURGERIES      OB History    Gravida Para Term Preterm AB Living   0 0 0 0 0 0   SAB TAB Ectopic Multiple Live Births   0 0 0 0         Home Medications    Prior to Admission medications   Medication Sig Start Date End Date Taking? Authorizing Provider  albuterol (PROVENTIL HFA;VENTOLIN HFA) 108 (90 Base) MCG/ACT inhaler Inhale 2 puffs into the lungs every 4 (four) hours as needed for wheezing or shortness of breath (cough). 12/02/15   Mercedes Camprubi-Soms, PA-C  ergocalciferol (VITAMIN D2) 50000 units capsule Take 1 capsule (50,000 Units total) by mouth once a week. 10/14/15   Argentina Donovan, PA-C  predniSONE (DELTASONE) 10 MG tablet Take 2 tablets (20 mg total) by  mouth daily. 01/07/16   Delos Haring, PA-C    Family History Family History  Problem Relation Age of Onset  . Hypertension Mother   . Diabetes Mother   . Asthma Mother   . Stroke Mother   . Cancer Mother   . Asthma Brother   . Heart disease Maternal Aunt     Social History Social History  Substance Use Topics  . Smoking status: Never Smoker  . Smokeless tobacco: Never Used  . Alcohol use No     Allergies   Review of patient's allergies indicates no known allergies.   Review of Systems Review  of Systems  A complete 10 system review of systems was obtained and all systems are negative except as noted in the HPI and PMH.   Physical Exam Updated Vital Signs BP 143/91   Pulse 84   Temp 98.8 F (37.1 C) (Oral)   Resp 18   LMP 05/25/2013   SpO2 96%   Physical Exam  Constitutional: She is oriented to person, place, and time. She appears well-developed and well-nourished. No distress.  HENT:  Head: Normocephalic and atraumatic.  Eyes: EOM are normal.  Neck: Normal range of motion.  Cardiovascular: Normal rate, regular rhythm and normal heart sounds.   Pulmonary/Chest: Effort normal. She has wheezes.  Diffuse wheezing bilaterally  Abdominal: Soft. She exhibits no distension. There is no tenderness.  Musculoskeletal: Normal range of motion.  Neurological: She is alert and oriented to person, place, and time.  Skin: Skin is warm and dry.  Psychiatric: She has a normal mood and affect. Judgment normal.  Nursing note and vitals reviewed.    ED Treatments / Results  DIAGNOSTIC STUDIES: Oxygen Saturation is 96% on RA, adequate by my interpretation.  COORDINATION OF CARE: 12:29 AM Discussed treatment plan with pt at bedside and pt agreed to plan.  Labs (all labs ordered are listed, but only abnormal results are displayed) Labs Reviewed - No data to display  EKG  EKG Interpretation None       Radiology Dg Chest 2 View  Result Date: 04/13/2016 CLINICAL DATA:  Shortness of breath.  History of chronic bronchitis. EXAM: CHEST  2 VIEW COMPARISON:  Chest radiograph January 07, 2016 FINDINGS: Cardiomediastinal silhouette is normal. Mild bronchitic changes. No pleural effusions or focal consolidations. Trachea projects midline and there is no pneumothorax. Soft tissue planes and included osseous structures are non-suspicious. IMPRESSION: Mild bronchitic changes without focal consolidation. Electronically Signed   By: Elon Alas M.D.   On: 04/13/2016 22:16     Procedures Procedures (including critical care time)  Medications Ordered in ED Medications  albuterol (PROVENTIL) (2.5 MG/3ML) 0.083% nebulizer solution 5 mg (not administered)  albuterol (PROVENTIL HFA;VENTOLIN HFA) 108 (90 Base) MCG/ACT inhaler 2 puff (not administered)  albuterol (PROVENTIL) (2.5 MG/3ML) 0.083% nebulizer solution 2.5 mg (2.5 mg Nebulization Given 04/13/16 2054)  methylPREDNISolone sodium succinate (SOLU-MEDROL) 125 mg/2 mL injection 125 mg (125 mg Intravenous Given 04/14/16 0038)  albuterol (PROVENTIL) (2.5 MG/3ML) 0.083% nebulizer solution 10 mg (10 mg Nebulization Given 04/14/16 0051)  ipratropium (ATROVENT) nebulizer solution 0.5 mg (0.5 mg Nebulization Given 04/14/16 0051)     Initial Impression / Assessment and Plan / ED Course  I have reviewed the triage vital signs and the nursing notes.  Pertinent labs & imaging results that were available during my care of the patient were reviewed by me and considered in my medical decision making (see chart for details).  Clinical Course   Pt improved  at this time. Home with prednisone and albuterol for asthma exacerbation. Instructions to return to ER for new or worsening symptoms  Final Clinical Impressions(s) / ED Diagnoses   Final diagnoses:  Asthma exacerbation   I personally performed the services described in this documentation, which was scribed in my presence. The recorded information has been reviewed and is accurate.     New Prescriptions New Prescriptions   ALBUTEROL (PROVENTIL HFA;VENTOLIN HFA) 108 (90 BASE) MCG/ACT INHALER    Inhale 2 puffs into the lungs every 4 (four) hours as needed for wheezing or shortness of breath.   PREDNISONE (DELTASONE) 10 MG TABLET    Take 6 tablets (60 mg total) by mouth daily.     Jola Schmidt, MD 04/14/16 [id number]    Jola Schmidt, MD 04/14/16 [phone]

## 2016-05-11 ENCOUNTER — Encounter (HOSPITAL_COMMUNITY): Payer: Self-pay | Admitting: *Deleted

## 2016-05-11 ENCOUNTER — Emergency Department (HOSPITAL_COMMUNITY): Payer: Medicaid Other

## 2016-05-11 ENCOUNTER — Emergency Department (HOSPITAL_COMMUNITY)
Admission: EM | Admit: 2016-05-11 | Discharge: 2016-05-12 | Disposition: A | Discharge status: Home / Self Care | Payer: Medicaid Other | Attending: Emergency Medicine | Admitting: Emergency Medicine

## 2016-05-11 DIAGNOSIS — R0602 Shortness of breath: Secondary | ICD-10-CM | POA: Diagnosis present

## 2016-05-11 DIAGNOSIS — J4541 Moderate persistent asthma with (acute) exacerbation: Secondary | ICD-10-CM | POA: Diagnosis not present

## 2016-05-11 MED ORDER — ALBUTEROL SULFATE (2.5 MG/3ML) 0.083% IN NEBU
5.0000 mg | INHALATION_SOLUTION | Freq: Once | RESPIRATORY_TRACT | Status: AC
  Administered 2016-05-11: 5 mg via RESPIRATORY_TRACT

## 2016-05-11 MED ORDER — ALBUTEROL SULFATE (2.5 MG/3ML) 0.083% IN NEBU
INHALATION_SOLUTION | RESPIRATORY_TRACT | Status: AC
  Filled 2016-05-11: qty 6

## 2016-05-11 NOTE — ED Triage Notes (Signed)
Pt c/o cold symptoms x 2 days. Pt reports wheezing onset tonight. Has been using an inhaler without improvement. Wheezing noted throughout

## 2016-05-12 MED ORDER — PREDNISONE 20 MG PO TABS: ORAL_TABLET | ORAL | 0 refills | Status: DC

## 2016-05-12 MED ORDER — ALBUTEROL SULFATE HFA 108 (90 BASE) MCG/ACT IN AERS
2.0000 | INHALATION_SPRAY | Freq: Four times a day (QID) | RESPIRATORY_TRACT | Status: DC | PRN
  Administered 2016-05-12: 2 via RESPIRATORY_TRACT
  Filled 2016-05-12: qty 6.7

## 2016-05-12 MED ORDER — ALBUTEROL SULFATE (2.5 MG/3ML) 0.083% IN NEBU
5.0000 mg | INHALATION_SOLUTION | Freq: Once | RESPIRATORY_TRACT | Status: AC
  Administered 2016-05-12: 5 mg via RESPIRATORY_TRACT

## 2016-05-12 NOTE — ED Notes (Signed)
Spoke with Respiratory via phone.  I was advised that the patient's peak flow was 100 prior to treatment and 150 after treatment.  Will advise provider.

## 2016-05-12 NOTE — ED Provider Notes (Signed)
[facility] DEPT Provider Note   CSN: [id number] Arrival date & time: 05/11/16  2316  By signing my name below, I, Reola Mosher, attest that this documentation has been prepared under the direction and in the presence of Etta Quill, NP. Electronically Signed: Reola Mosher, ED Scribe. 05/12/16. 12:12 AM.  History   Chief Complaint Chief Complaint  Patient presents with  . Asthma   The history is provided by the patient. No language interpreter was used.  Wheezing   This is a recurrent problem. The current episode started more than 2 days ago. The problem occurs constantly. Progression since onset: waxing and waning. Associated symptoms include rhinorrhea, sore throat, cough and sputum production. Pertinent negatives include no fever, no vomiting and no ear pain. She has tried beta-agonist inhalers for the symptoms. The treatment provided mild relief. She has had no prior hospitalizations. She has had prior ED visits. She has had no prior ICU admissions. Her past medical history is significant for asthma.   HPI Comments: Sherri Shaw is a 35 y.o. female with a h/o asthma, who presents to the Emergency Department complaining of constant, moderate wheezing with associated shortness of breath onset ~1 week ago. Pt reports associated productive cough with yellow sputum, rhinorrhea, and mild sore throat secondary to her wheezing. Pt has been using her at home rescue inhaler with mild relief of her symptoms, but notes that that her wheezing will always return. Pt has a h/o asthma and notes that her symptoms today are similar to her symptoms in the past with asthma. No exacerbating or precipitating factors noted. Denies fever, chills, nausea, vomiting, ear pain, or any other associated symptoms.   Past Medical History:  Diagnosis Date  . Anemia   . Asthma   . Bipolar disorder [facility])    sister denies this hx on 04/07/2013  . Chronic bronchitis ([place])    "I get it q yr" (04/07/2013)    . Exertional shortness of breath    ? due to anemia  . History of blood transfusion 04/07/2013   "got my first transfusion today; got 4 units" (04/07/2013)  . Microcytic anemia    Archie Endo 04/07/2013  . Schizophrenia ([place])    "don't really know the type"/sister (04/07/2013)   Patient Active Problem List   Diagnosis Date Noted  . Asthma exacerbation 09/03/2014  . Schizo-affective schizophrenia ([place]) 04/10/2013  . Microcytic anemia 04/07/2013  . Uterine mass 04/07/2013  . Trichomonas 04/07/2013  . Bipolar disorder ([place]) 04/07/2013  . Bilateral lower extremity edema 04/07/2013   Past Surgical History:  Procedure Laterality Date  . ABDOMINAL HYSTERECTOMY N/A 06/01/2013   Procedure: HYSTERECTOMY ABDOMINAL;  Surgeon: Lavonia Drafts, MD;  Location: [facility];  Service: Gynecology;  Laterality: N/A;  . BILATERAL SALPINGECTOMY Bilateral 06/01/2013   Procedure: BILATERAL SALPINGECTOMY;  Surgeon: Lavonia Drafts, MD;  Location: [facility];  Service: Gynecology;  Laterality: Bilateral;  . CYSTOSCOPY W/ URETERAL STENT PLACEMENT Bilateral 06/01/2013   Procedure: CYSTOSCOPY WITH BILATERAL STENT REPLACEMENT;  Surgeon: Irine Seal, MD;  Location: [facility];  Service: Urology;  Laterality: Bilateral;  . NO PAST SURGERIES     OB History    Gravida Para Term Preterm AB Living   0 0 0 0 0 0   SAB TAB Ectopic Multiple Live Births   0 0 0 0       Home Medications    Prior to Admission medications   Medication Sig Start Date End Date Taking? Authorizing Provider  albuterol (PROVENTIL HFA;VENTOLIN HFA)  108 (90 Base) MCG/ACT inhaler Inhale 2 puffs into the lungs every 4 (four) hours as needed for wheezing or shortness of breath. 04/14/16   Jola Schmidt, MD  ergocalciferol (VITAMIN D2) 50000 units capsule Take 1 capsule (50,000 Units total) by mouth once a week. Patient not taking: Reported on 04/14/2016 10/14/15   Argentina Donovan, PA-C  predniSONE (DELTASONE) 10 MG tablet Take 6 tablets (60 mg total) by  mouth daily. 04/14/16   Jola Schmidt, MD   Family History Family History  Problem Relation Age of Onset  . Hypertension Mother   . Diabetes Mother   . Asthma Mother   . Stroke Mother   . Cancer Mother   . Asthma Brother   . Heart disease Maternal Aunt    Social History Social History  Substance Use Topics  . Smoking status: Never Smoker  . Smokeless tobacco: Never Used  . Alcohol use No   Allergies   Review of patient's allergies indicates no known allergies.  Review of Systems Review of Systems  Constitutional: Negative for fever.  HENT: Positive for rhinorrhea and sore throat. Negative for ear pain.   Respiratory: Positive for cough, sputum production, shortness of breath and wheezing.   Gastrointestinal: Negative for nausea and vomiting.  All other systems reviewed and are negative.  Physical Exam Updated Vital Signs BP 126/77   Pulse 75   Temp 97.9 F (36.6 C)   Resp 18   LMP 05/25/2013   SpO2 99%   Physical Exam  Constitutional: She appears well-developed and well-nourished.  HENT:  Head: Normocephalic.  Right Ear: External ear normal.  Left Ear: External ear normal.  Nose: Nose normal.  Mouth/Throat: No oropharyngeal exudate.  Mild oropharyngeal erythema but without exudates. No signs of peritonsillar abscess.   Eyes: Conjunctivae are normal.  Cardiovascular: Normal rate, regular rhythm and normal heart sounds.   No murmur heard. Pulmonary/Chest: Effort normal. No respiratory distress. She has wheezes. She has no rales.  Bilateral wheezing noted throughout.   Abdominal: She exhibits no distension.  Musculoskeletal: Normal range of motion.  Neurological: She is alert.  Skin: Skin is warm and dry.  Psychiatric: She has a normal mood and affect. Her behavior is normal.  Nursing note and vitals reviewed.  ED Treatments / Results  DIAGNOSTIC STUDIES: Oxygen Saturation is 99% on RA, normal by my interpretation.   COORDINATION OF CARE: 12:09  AM-Discussed next steps with pt. Pt verbalized understanding and is agreeable with the plan.   Labs (all labs ordered are listed, but only abnormal results are displayed) Labs Reviewed - No data to display  Radiology No results found.  Procedures Procedures (including critical care time)  Medications Ordered in ED Medications  albuterol (PROVENTIL) (2.5 MG/3ML) 0.083% nebulizer solution 5 mg (5 mg Nebulization Given 05/11/16 2323)   Initial Impression / Assessment and Plan / ED Course  I have reviewed the triage vital signs and the nursing notes.  Pertinent labs & imaging results that were available during my care of the patient were reviewed by me and considered in my medical decision making (see chart for details).  Clinical Course  Patient with mild signs and symptoms of asthma/RAD. Oxygen saturation is above 90%. No accessory muscle use, no cyanosis. Treated in the ED.  Patient feels improved after treatment. Will discharge with prednisone and albuterol HFA. Pt instructed to follow up with PCP. Patient is hemodynamically stable. Discussed return precautions. Pt appears safe for discharge.     Final Clinical Impressions(s) /  ED Diagnoses   Final diagnoses:  Moderate persistent asthma with exacerbation   New Prescriptions New Prescriptions   PREDNISONE (DELTASONE) 20 MG TABLET    3 tabs po day one, then 2 tabs daily x 4 days   I personally performed the services described in this documentation, which was scribed in my presence. The recorded information has been reviewed and is accurate.     Etta Quill, NP 05/12/16 0225    Orpah Greek, MD 05/12/16 2329

## 2016-05-12 NOTE — Progress Notes (Signed)
RT did peak flow with patient before and after treatment. Peak flow before the treatment was 100. Patient given ALB treatment. Peak flow improved to 150. Patient states she does not have nebulizer at home. Patient could benefit from home nebulizer.

## 2016-06-20 ENCOUNTER — Emergency Department (HOSPITAL_COMMUNITY): Payer: Medicaid Other

## 2016-06-20 ENCOUNTER — Emergency Department (HOSPITAL_COMMUNITY)
Admission: EM | Admit: 2016-06-20 | Discharge: 2016-06-20 | Disposition: A | Discharge status: Home / Self Care | Payer: Medicaid Other | Attending: Emergency Medicine | Admitting: Emergency Medicine

## 2016-06-20 DIAGNOSIS — Z79899 Other long term (current) drug therapy: Secondary | ICD-10-CM | POA: Diagnosis not present

## 2016-06-20 DIAGNOSIS — J4541 Moderate persistent asthma with (acute) exacerbation: Secondary | ICD-10-CM | POA: Diagnosis not present

## 2016-06-20 DIAGNOSIS — R0602 Shortness of breath: Secondary | ICD-10-CM | POA: Diagnosis present

## 2016-06-20 MED ORDER — ALBUTEROL SULFATE (2.5 MG/3ML) 0.083% IN NEBU
5.0000 mg | INHALATION_SOLUTION | Freq: Once | RESPIRATORY_TRACT | Status: AC
  Administered 2016-06-20: 5 mg via RESPIRATORY_TRACT

## 2016-06-20 MED ORDER — PREDNISONE 20 MG PO TABS
60.0000 mg | ORAL_TABLET | Freq: Once | ORAL | Status: AC
  Administered 2016-06-20: 60 mg via ORAL
  Filled 2016-06-20: qty 3

## 2016-06-20 MED ORDER — ALBUTEROL SULFATE HFA 108 (90 BASE) MCG/ACT IN AERS
2.0000 | INHALATION_SPRAY | Freq: Once | RESPIRATORY_TRACT | Status: AC
  Administered 2016-06-20: 2 via RESPIRATORY_TRACT
  Filled 2016-06-20: qty 6.7

## 2016-06-20 MED ORDER — ALBUTEROL SULFATE (2.5 MG/3ML) 0.083% IN NEBU
INHALATION_SOLUTION | RESPIRATORY_TRACT | Status: AC
  Filled 2016-06-20: qty 6

## 2016-06-20 MED ORDER — ALBUTEROL SULFATE HFA 108 (90 BASE) MCG/ACT IN AERS: 2.0000 | INHALATION_SPRAY | RESPIRATORY_TRACT | 0 refills | Status: DC | PRN

## 2016-06-20 MED ORDER — PREDNISONE 20 MG PO TABS: 40.0000 mg | ORAL_TABLET | Freq: Every day | ORAL | 0 refills | Status: DC

## 2016-06-20 NOTE — ED Triage Notes (Signed)
Pt states she has had a cold for 2 weeks and has been increasingly getting more short of breath with activity. Pt denies having a fever. Pt denies taking OTC cold medications.

## 2016-06-20 NOTE — ED Provider Notes (Signed)
[facility] DEPT Provider Note   CSN: [id number] Arrival date & time: 06/20/16  0243     History   Chief Complaint Chief Complaint  Patient presents with  . Shortness of Breath    HPI Sherri Shaw is a 35 y.o. female.  HPI  This is a 35 year old female with a history of asthma who presents with shortness of breath. Patient reports 2 week history of increasing shortness of breath. Mostly with activity. She reports she has had no upper respiratory infection which she feels it's contributing. She denies fever. She does have a history of asthma. Does not have an inhaler.  She denies chest pain. She does report productive cough.  Past Medical History:  Diagnosis Date  . Anemia   . Asthma   . Bipolar disorder [facility])    sister denies this hx on 04/07/2013  . Chronic bronchitis ([place])    "I get it q yr" (04/07/2013)  . Exertional shortness of breath    ? due to anemia  . History of blood transfusion 04/07/2013   "got my first transfusion today; got 4 units" (04/07/2013)  . Microcytic anemia    Archie Endo 04/07/2013  . Schizophrenia ([place])    "don't really know the type"/sister (04/07/2013)    Patient Active Problem List   Diagnosis Date Noted  . Asthma exacerbation 09/03/2014  . Schizo-affective schizophrenia ([place]) 04/10/2013  . Microcytic anemia 04/07/2013  . Uterine mass 04/07/2013  . Trichomonas 04/07/2013  . Bipolar disorder ([place]) 04/07/2013  . Bilateral lower extremity edema 04/07/2013    Past Surgical History:  Procedure Laterality Date  . ABDOMINAL HYSTERECTOMY N/A 06/01/2013   Procedure: HYSTERECTOMY ABDOMINAL;  Surgeon: Lavonia Drafts, MD;  Location: [facility];  Service: Gynecology;  Laterality: N/A;  . BILATERAL SALPINGECTOMY Bilateral 06/01/2013   Procedure: BILATERAL SALPINGECTOMY;  Surgeon: Lavonia Drafts, MD;  Location: [facility];  Service: Gynecology;  Laterality: Bilateral;  . CYSTOSCOPY W/ URETERAL STENT PLACEMENT Bilateral 06/01/2013   Procedure:  CYSTOSCOPY WITH BILATERAL STENT REPLACEMENT;  Surgeon: Irine Seal, MD;  Location: [facility];  Service: Urology;  Laterality: Bilateral;  . NO PAST SURGERIES      OB History    Gravida Para Term Preterm AB Living   0 0 0 0 0 0   SAB TAB Ectopic Multiple Live Births   0 0 0 0         Home Medications    Prior to Admission medications   Medication Sig Start Date End Date Taking? Authorizing Provider  albuterol (PROVENTIL HFA;VENTOLIN HFA) 108 (90 Base) MCG/ACT inhaler Inhale 2 puffs into the lungs every 4 (four) hours as needed for wheezing or shortness of breath. 06/20/16   Merryl Hacker, MD  ergocalciferol (VITAMIN D2) 50000 units capsule Take 1 capsule (50,000 Units total) by mouth once a week. Patient not taking: Reported on 04/14/2016 10/14/15   Argentina Donovan, PA-C  predniSONE (DELTASONE) 20 MG tablet Take 2 tablets (40 mg total) by mouth daily. 06/20/16   Merryl Hacker, MD    Family History Family History  Problem Relation Age of Onset  . Hypertension Mother   . Diabetes Mother   . Asthma Mother   . Stroke Mother   . Cancer Mother   . Asthma Brother   . Heart disease Maternal Aunt     Social History Social History  Substance Use Topics  . Smoking status: Never Smoker  . Smokeless tobacco: Never Used  . Alcohol use No     Allergies  Patient has no known allergies.   Review of Systems Review of Systems  Constitutional: Negative for fever.  Respiratory: Positive for cough, shortness of breath and wheezing.   Cardiovascular: Negative for chest pain.  All other systems reviewed and are negative.    Physical Exam Updated Vital Signs BP 109/80 (BP Location: Right Arm)   Pulse 66   Temp 97.3 F (36.3 C) (Oral)   Resp 20   Ht 5\' 2"  (1.575 m)   LMP 05/25/2013   SpO2 98%   Physical Exam  Constitutional: She is oriented to person, place, and time. She appears well-developed and well-nourished.  HENT:  Head: Normocephalic and atraumatic.    Cardiovascular: Normal rate, regular rhythm and normal heart sounds.   Pulmonary/Chest: Effort normal. No respiratory distress. She has wheezes.  Diffuse expiratory wheezing, fair air movement  Abdominal: Soft. There is no tenderness.  Neurological: She is alert and oriented to person, place, and time.  Skin: Skin is warm and dry.  Psychiatric: She has a normal mood and affect.  Nursing note and vitals reviewed.    ED Treatments / Results  Labs (all labs ordered are listed, but only abnormal results are displayed) Labs Reviewed - No data to display  EKG  EKG Interpretation None       Radiology Dg Chest 2 View  Result Date: 06/20/2016 CLINICAL DATA:  Dyspnea tonight. EXAM: CHEST  2 VIEW COMPARISON:  05/11/2016 FINDINGS: The lungs are clear. The pulmonary vasculature is normal. Heart size is normal. Hilar and mediastinal contours are unremarkable. There is no pleural effusion. IMPRESSION: No active cardiopulmonary disease. Electronically Signed   By: Andreas Newport M.D.   On: 06/20/2016 03:21    Procedures Procedures (including critical care time)  Medications Ordered in ED Medications  albuterol (PROVENTIL) (2.5 MG/3ML) 0.083% nebulizer solution 5 mg (5 mg Nebulization Given 06/20/16 0302)  albuterol (PROVENTIL HFA;VENTOLIN HFA) 108 (90 Base) MCG/ACT inhaler 2 puff (2 puffs Inhalation Given 06/20/16 0534)  predniSONE (DELTASONE) tablet 60 mg (60 mg Oral Given 06/20/16 0534)     Initial Impression / Assessment and Plan / ED Course  I have reviewed the triage vital signs and the nursing notes.  Pertinent labs & imaging results that were available during my care of the patient were reviewed by me and considered in my medical decision making (see chart for details).  Clinical Course     patient presents with upper respiratory symptoms. Wheezing on exam. History of asthma. Patient given an inhaler and prednisone. Reports improvement on recheck. Able to ambulate and  maintain pulse ox. Will discharge home with a short course of prednisone and an inhaler.  Final Clinical Impressions(s) / ED Diagnoses   Final diagnoses:  Moderate persistent asthma with exacerbation    New Prescriptions Discharge Medication List as of 06/20/2016  6:14 AM       Merryl Hacker, MD 06/20/16 [phone]

## 2016-06-20 NOTE — ED Notes (Signed)
Pt was ambulated in hall on room air. Pt maintained an SpO2 of  97-100%. Pt tolerated ambulation well.

## 2017-06-16 ENCOUNTER — Emergency Department (HOSPITAL_COMMUNITY): Payer: Self-pay

## 2017-06-16 ENCOUNTER — Encounter (HOSPITAL_COMMUNITY): Payer: Self-pay

## 2017-06-16 ENCOUNTER — Other Ambulatory Visit: Payer: Self-pay

## 2017-06-16 ENCOUNTER — Emergency Department (HOSPITAL_COMMUNITY)
Admission: EM | Admit: 2017-06-16 | Discharge: 2017-06-16 | Disposition: A | Discharge status: Home / Self Care | Payer: Self-pay | Attending: Emergency Medicine | Admitting: Emergency Medicine

## 2017-06-16 DIAGNOSIS — J40 Bronchitis, not specified as acute or chronic: Secondary | ICD-10-CM

## 2017-06-16 DIAGNOSIS — J4541 Moderate persistent asthma with (acute) exacerbation: Secondary | ICD-10-CM | POA: Insufficient documentation

## 2017-06-16 MED ORDER — ALBUTEROL (5 MG/ML) CONTINUOUS INHALATION SOLN
15.0000 mg/h | INHALATION_SOLUTION | RESPIRATORY_TRACT | Status: DC
  Administered 2017-06-16: 15 mg/h via RESPIRATORY_TRACT
  Filled 2017-06-16: qty 20

## 2017-06-16 MED ORDER — ALBUTEROL SULFATE HFA 108 (90 BASE) MCG/ACT IN AERS
2.0000 | INHALATION_SPRAY | RESPIRATORY_TRACT | Status: DC
  Filled 2017-06-16: qty 6.7

## 2017-06-16 MED ORDER — IPRATROPIUM-ALBUTEROL 0.5-2.5 (3) MG/3ML IN SOLN: 3.0000 mL | Freq: Once | RESPIRATORY_TRACT | Status: DC

## 2017-06-16 MED ORDER — IPRATROPIUM BROMIDE 0.02 % IN SOLN
1.5000 mg | Freq: Once | RESPIRATORY_TRACT | Status: AC
  Administered 2017-06-16: 1.5 mg via RESPIRATORY_TRACT
  Filled 2017-06-16: qty 7.5

## 2017-06-16 MED ORDER — PREDNISONE 20 MG PO TABS
60.0000 mg | ORAL_TABLET | Freq: Once | ORAL | Status: AC
  Administered 2017-06-16: 20 mg via ORAL
  Filled 2017-06-16: qty 3

## 2017-06-16 MED ORDER — PREDNISONE 20 MG PO TABS: ORAL_TABLET | ORAL | 0 refills | Status: DC

## 2017-06-16 MED ORDER — AZITHROMYCIN 250 MG PO TABS: ORAL_TABLET | ORAL | 0 refills | Status: DC

## 2017-06-16 NOTE — ED Notes (Signed)
Pt states she does not have a primary care provider to get medication/prescriptions from. Pt stated every time she has an asthma attack / flair up, pt goes to ED for medical treatment.

## 2017-06-16 NOTE — ED Notes (Signed)
Bed: WA09 Expected date:  Expected time:  Means of arrival:  Comments: 36 yo SOB- neb given

## 2017-06-16 NOTE — ED Provider Notes (Signed)
[place] DEPT Provider Note   CSN: [id number] Arrival date & time: 06/16/17  0539     History   Chief Complaint Chief Complaint  Patient presents with  . Shortness of Breath    HPI Sherri Shaw is a 36 y.o. female.  HPI Short of breath and cough for 1 week. No CP. No fever. Yellow sputum with cough. Out of inhalers for a month. No lower extremity swelling or pain. At onset upper URI symptoms. Non smoker. Past Medical History:  Diagnosis Date  . Anemia   . Asthma   . Bipolar disorder [facility])    sister denies this hx on 04/07/2013  . Chronic bronchitis ([place])    "I get it q yr" (04/07/2013)  . Exertional shortness of breath    ? due to anemia  . History of blood transfusion 04/07/2013   "got my first transfusion today; got 4 units" (04/07/2013)  . Microcytic anemia    Archie Endo 04/07/2013  . Schizophrenia ([place])    "don't really know the type"/sister (04/07/2013)    Patient Active Problem List   Diagnosis Date Noted  . Asthma exacerbation 09/03/2014  . Schizo-affective schizophrenia ([place]) 04/10/2013  . Microcytic anemia 04/07/2013  . Uterine mass 04/07/2013  . Trichomonas 04/07/2013  . Bipolar disorder ([place]) 04/07/2013  . Bilateral lower extremity edema 04/07/2013    Past Surgical History:  Procedure Laterality Date  . HYSTERECTOMY ABDOMINAL WITH SALPINGECTOMY    . NO PAST SURGERIES      OB History    Gravida Para Term Preterm AB Living   0 0 0 0 0 0   SAB TAB Ectopic Multiple Live Births   0 0 0 0         Home Medications    Prior to Admission medications   Medication Sig Start Date End Date Taking? Authorizing Provider  albuterol (PROVENTIL HFA;VENTOLIN HFA) 108 (90 Base) MCG/ACT inhaler Inhale 2 puffs into the lungs every 4 (four) hours as needed for wheezing or shortness of breath. Patient not taking: Reported on 06/16/2017 06/20/16   Horton, Barbette Hair, MD  azithromycin (ZITHROMAX Z-PAK) 250 MG tablet Take 2 tablets on day 1 and  1 tablet daily for 4 days 06/16/17   Charlesetta Shanks, MD  ergocalciferol (VITAMIN D2) 50000 units capsule Take 1 capsule (50,000 Units total) by mouth once a week. Patient not taking: Reported on 04/14/2016 10/14/15   Argentina Donovan, PA-C  predniSONE (DELTASONE) 20 MG tablet Take 2 tablets (40 mg total) by mouth daily. Patient not taking: Reported on 06/16/2017 06/20/16   Horton, Barbette Hair, MD  predniSONE (DELTASONE) 20 MG tablet 2 tabs po daily x 4 days 06/16/17   Charlesetta Shanks, MD    Family History Family History  Problem Relation Age of Onset  . Hypertension Mother   . Diabetes Mother   . Asthma Mother   . Stroke Mother   . Cancer Mother   . Asthma Brother   . Heart disease Maternal Aunt     Social History Social History   Tobacco Use  . Smoking status: Never Smoker  . Smokeless tobacco: Never Used  Substance Use Topics  . Alcohol use: No  . Drug use: No     Allergies   Patient has no known allergies.   Review of Systems Review of Systems 10 Systems reviewed and are negative for acute change except as noted in the HPI.   Physical Exam Updated Vital Signs BP 111/66   Pulse  94   Temp 98.3 F (36.8 C) (Oral)   Resp 15   Ht 5\' 2"  (1.575 m)   Wt 70.3 kg (155 lb)   LMP 05/25/2013   SpO2 100%   BMI 28.35 kg/m   Physical Exam  Constitutional: She is oriented to person, place, and time. She appears well-developed and well-nourished. No distress.  No respiratory distress at rest.  HENT:  Head: Normocephalic and atraumatic.  Eyes: EOM are normal. Pupils are equal, round, and reactive to light.  Neck: Neck supple.  Cardiovascular: Normal rate, regular rhythm, normal heart sounds and intact distal pulses.  Pulmonary/Chest: Effort normal. She has wheezes.  Wheeze throughout lung fields. Adequate airflow to bases.  Abdominal: Soft. Bowel sounds are normal. She exhibits no distension. There is no tenderness.  Musculoskeletal: Normal range of motion. She  exhibits no edema or tenderness.  Neurological: She is alert and oriented to person, place, and time. She has normal strength. Coordination normal. GCS eye subscore is 4. GCS verbal subscore is 5. GCS motor subscore is 6.  Skin: Skin is warm, dry and intact.  Psychiatric: She has a normal mood and affect.     ED Treatments / Results  Labs (all labs ordered are listed, but only abnormal results are displayed) Labs Reviewed - No data to display  EKG  EKG Interpretation None       Radiology Dg Chest 2 View  Result Date: 06/16/2017 CLINICAL DATA:  Wheezing and shortness of breath EXAM: CHEST  2 VIEW COMPARISON:  June 20, 2016 FINDINGS: The lungs are clear. The heart size and pulmonary vascularity are normal. No adenopathy. No bone lesions. IMPRESSION: No edema or consolidation. Electronically Signed   By: Lowella Grip III M.D.   On: 06/16/2017 10:34    Procedures Procedures (including critical care time)  Medications Ordered in ED Medications  albuterol (PROVENTIL,VENTOLIN) solution continuous neb (15 mg/hr Nebulization New Bag/Given 06/16/17 1059)  albuterol (PROVENTIL HFA;VENTOLIN HFA) 108 (90 Base) MCG/ACT inhaler 2 puff (not administered)  ipratropium (ATROVENT) nebulizer solution 1.5 mg (1.5 mg Nebulization Given 06/16/17 1059)  predniSONE (DELTASONE) tablet 60 mg (20 mg Oral Given 06/16/17 1100)     Initial Impression / Assessment and Plan / ED Course  I have reviewed the triage vital signs and the nursing notes.  Pertinent labs & imaging results that were available during my care of the patient were reviewed by me and considered in my medical decision making (see chart for details).     Recheck: Patient has ambulated around the emergency department with room air saturation remaining mid 90s.  Patient developed some tachypnea but reports she feels much better than previously.  Back in the room, she is speaking in full sentences without distress. Final Clinical  Impressions(s) / ED Diagnoses   Final diagnoses:  Moderate persistent asthma with exacerbation  Bronchitis  Patient has a history of asthma.  She is not getting routine medical care for the asthma.  She reports she has been out of any inhalers for at least a month or more.  She reports she has developed productive cough and then increased wheezing and shortness of breath.  No fever, no leg pain, no lower extremity swelling.  X-ray does not show focal pneumonia.  Was treated with continuous nebulizer and Solu-Medrol.  She has shown significant improvement.  Plan will be for out Patient prednisone burst, Zithromax and albuterol every 4 hours at least for the next 24 hours and then as needed.  Patient is  counseled on the importance of returning immediately should her breathing become more difficult, she develop fever or other concerning symptoms.  She is also counseled on how important it is to find a primary care doctor to get baseline asthma maintenance therapy.  ED Discharge Orders        Ordered    predniSONE (DELTASONE) 20 MG tablet     06/16/17 1409    azithromycin (ZITHROMAX Z-PAK) 250 MG tablet     06/16/17 1409       Charlesetta Shanks, MD 06/16/17 1422

## 2017-06-16 NOTE — Discharge Instructions (Signed)
1.  Use the albuterol inhaler with spacer chamber.  Take 2 puffs every 4 hours for the next 24 hours.  You may then use it as needed. 2.  Take prednisone and Zithromax as prescribed. 3.  Return immediately to the emergency department if you are developing worsening shortness of breath, chest pain or difficulty breathing. 4.  You need routine management of your asthma.  It is very important that you have a primary care doctor managing it.  Either go to [facility] and fill out new patient paperwork or use the referral number your discharge instructions to find a family doctor.  Having asthma that is not routinely managed is very dangerous.

## 2017-06-16 NOTE — ED Notes (Signed)
Ambulate RR 27 O2 99% HR 119

## 2017-06-16 NOTE — ED Triage Notes (Signed)
Pt called EMS due to past 7 days SOB has been getting worse. Pt has not been out of medications for unknown time. Pt is coughing up some yellow mucous. Pt was 92% on room air, and 100% on neb treatment.

## 2017-07-31 ENCOUNTER — Inpatient Hospital Stay (HOSPITAL_COMMUNITY)
Admission: EM | Admit: 2017-07-31 | Discharge: 2017-08-02 | DRG: 202 | Disposition: A | Discharge status: Home / Self Care | Payer: Self-pay | Attending: Internal Medicine | Admitting: Internal Medicine

## 2017-07-31 ENCOUNTER — Encounter (HOSPITAL_COMMUNITY): Payer: Self-pay | Admitting: Emergency Medicine

## 2017-07-31 ENCOUNTER — Emergency Department (HOSPITAL_COMMUNITY): Payer: Self-pay

## 2017-07-31 DIAGNOSIS — Z9079 Acquired absence of other genital organ(s): Secondary | ICD-10-CM

## 2017-07-31 DIAGNOSIS — A419 Sepsis, unspecified organism: Secondary | ICD-10-CM | POA: Diagnosis present

## 2017-07-31 DIAGNOSIS — J9601 Acute respiratory failure with hypoxia: Secondary | ICD-10-CM | POA: Diagnosis present

## 2017-07-31 DIAGNOSIS — E876 Hypokalemia: Secondary | ICD-10-CM | POA: Diagnosis present

## 2017-07-31 DIAGNOSIS — D72829 Elevated white blood cell count, unspecified: Secondary | ICD-10-CM | POA: Diagnosis not present

## 2017-07-31 DIAGNOSIS — Z90710 Acquired absence of both cervix and uterus: Secondary | ICD-10-CM

## 2017-07-31 DIAGNOSIS — F319 Bipolar disorder, unspecified: Secondary | ICD-10-CM | POA: Diagnosis present

## 2017-07-31 DIAGNOSIS — E872 Acidosis: Secondary | ICD-10-CM | POA: Diagnosis present

## 2017-07-31 DIAGNOSIS — T380X5A Adverse effect of glucocorticoids and synthetic analogues, initial encounter: Secondary | ICD-10-CM | POA: Diagnosis not present

## 2017-07-31 DIAGNOSIS — J45901 Unspecified asthma with (acute) exacerbation: Secondary | ICD-10-CM | POA: Diagnosis present

## 2017-07-31 DIAGNOSIS — Z9114 Patient's other noncompliance with medication regimen: Secondary | ICD-10-CM

## 2017-07-31 DIAGNOSIS — Z23 Encounter for immunization: Secondary | ICD-10-CM

## 2017-07-31 DIAGNOSIS — J4541 Moderate persistent asthma with (acute) exacerbation: Principal | ICD-10-CM | POA: Diagnosis present

## 2017-07-31 DIAGNOSIS — F209 Schizophrenia, unspecified: Secondary | ICD-10-CM | POA: Diagnosis present

## 2017-07-31 DIAGNOSIS — Z960 Presence of urogenital implants: Secondary | ICD-10-CM | POA: Diagnosis present

## 2017-07-31 DIAGNOSIS — D649 Anemia, unspecified: Secondary | ICD-10-CM | POA: Diagnosis present

## 2017-07-31 DIAGNOSIS — D509 Iron deficiency anemia, unspecified: Secondary | ICD-10-CM | POA: Diagnosis present

## 2017-07-31 DIAGNOSIS — Z825 Family history of asthma and other chronic lower respiratory diseases: Secondary | ICD-10-CM

## 2017-07-31 MED ORDER — IPRATROPIUM BROMIDE 0.02 % IN SOLN
0.5000 mg | RESPIRATORY_TRACT | Status: AC
  Administered 2017-07-31: 0.5 mg via RESPIRATORY_TRACT
  Filled 2017-07-31: qty 2.5

## 2017-07-31 MED ORDER — PREDNISONE 20 MG PO TABS
60.0000 mg | ORAL_TABLET | Freq: Once | ORAL | Status: AC
  Administered 2017-07-31: 60 mg via ORAL
  Filled 2017-07-31: qty 3

## 2017-07-31 MED ORDER — ALBUTEROL (5 MG/ML) CONTINUOUS INHALATION SOLN
15.0000 mg/h | INHALATION_SOLUTION | RESPIRATORY_TRACT | Status: AC
Start: 2017-07-31 — End: 2017-07-31
  Administered 2017-07-31: 15 mg/h via RESPIRATORY_TRACT
  Filled 2017-07-31 (×2): qty 20

## 2017-07-31 MED ORDER — ALBUTEROL SULFATE (2.5 MG/3ML) 0.083% IN NEBU
5.0000 mg | INHALATION_SOLUTION | Freq: Once | RESPIRATORY_TRACT | Status: AC
  Administered 2017-07-31: 5 mg via RESPIRATORY_TRACT

## 2017-07-31 NOTE — ED Provider Notes (Signed)
[place] EMERGENCY DEPARTMENT Provider Note   CSN: [id number] Arrival date & time: 07/31/17  1651     History   Chief Complaint Chief Complaint  Patient presents with  . Asthma    HPI Sherri Shaw is a 36 y.o. female.  HPI Patient presents to the emergency department with cough and wheezing.  Patient states that she started with the symptoms 4 days ago.  Patient states that she was out of her inhalers as well.  Patient states nothing seems to make the condition better or worse.  The patient denies chest pain, headache,blurred vision, neck pain, fever, weakness, numbness, dizziness, anorexia, edema, abdominal pain, nausea, vomiting, diarrhea, rash, back pain, dysuria, hematemesis, bloody stool, near syncope, or syncope. Past Medical History:  Diagnosis Date  . Anemia   . Asthma   . Bipolar disorder [facility])    sister denies this hx on 04/07/2013  . Chronic bronchitis ([place])    "I get it q yr" (04/07/2013)  . Exertional shortness of breath    ? due to anemia  . History of blood transfusion 04/07/2013   "got my first transfusion today; got 4 units" (04/07/2013)  . Microcytic anemia    Archie Endo 04/07/2013  . Schizophrenia ([place])    "don't really know the type"/sister (04/07/2013)    Patient Active Problem List   Diagnosis Date Noted  . Asthma exacerbation 09/03/2014  . Schizo-affective schizophrenia ([place]) 04/10/2013  . Microcytic anemia 04/07/2013  . Uterine mass 04/07/2013  . Trichomonas 04/07/2013  . Bipolar disorder ([place]) 04/07/2013  . Bilateral lower extremity edema 04/07/2013    Past Surgical History:  Procedure Laterality Date  . ABDOMINAL HYSTERECTOMY N/A 06/01/2013   Procedure: HYSTERECTOMY ABDOMINAL;  Surgeon: Lavonia Drafts, MD;  Location: [facility];  Service: Gynecology;  Laterality: N/A;  . BILATERAL SALPINGECTOMY Bilateral 06/01/2013   Procedure: BILATERAL SALPINGECTOMY;  Surgeon: Lavonia Drafts, MD;  Location: [facility];  Service:  Gynecology;  Laterality: Bilateral;  . CYSTOSCOPY W/ URETERAL STENT PLACEMENT Bilateral 06/01/2013   Procedure: CYSTOSCOPY WITH BILATERAL STENT REPLACEMENT;  Surgeon: Irine Seal, MD;  Location: [facility];  Service: Urology;  Laterality: Bilateral;  . HYSTERECTOMY ABDOMINAL WITH SALPINGECTOMY    . NO PAST SURGERIES      OB History    Gravida Para Term Preterm AB Living   0 0 0 0 0 0   SAB TAB Ectopic Multiple Live Births   0 0 0 0         Home Medications    Prior to Admission medications   Medication Sig Start Date End Date Taking? Authorizing Provider  albuterol (PROVENTIL HFA;VENTOLIN HFA) 108 (90 Base) MCG/ACT inhaler Inhale 2 puffs into the lungs every 4 (four) hours as needed for wheezing or shortness of breath. Patient not taking: Reported on 07/31/2017 06/20/16   Horton, Barbette Hair, MD  azithromycin (ZITHROMAX Z-PAK) 250 MG tablet Take 2 tablets on day 1 and 1 tablet daily for 4 days Patient not taking: Reported on 07/31/2017 06/16/17   Charlesetta Shanks, MD  ergocalciferol (VITAMIN D2) 50000 units capsule Take 1 capsule (50,000 Units total) by mouth once a week. Patient not taking: Reported on 07/31/2017 10/14/15   Argentina Donovan, PA-C  predniSONE (DELTASONE) 20 MG tablet Take 2 tablets (40 mg total) by mouth daily. Patient not taking: Reported on 07/31/2017 06/20/16   Horton, Barbette Hair, MD  predniSONE (DELTASONE) 20 MG tablet 2 tabs po daily x 4 days Patient not taking: Reported on 07/31/2017 06/16/17  Charlesetta Shanks, MD    Family History Family History  Problem Relation Age of Onset  . Hypertension Mother   . Diabetes Mother   . Asthma Mother   . Stroke Mother   . Cancer Mother   . Asthma Brother   . Heart disease Maternal Aunt     Social History Social History   Tobacco Use  . Smoking status: Never Smoker  . Smokeless tobacco: Never Used  Substance Use Topics  . Alcohol use: No  . Drug use: No     Allergies   Patient has no known  allergies.   Review of Systems Review of Systems All other systems negative except as documented in the HPI. All pertinent positives and negatives as reviewed in the HPI.  Physical Exam Updated Vital Signs BP 115/73   Pulse 87   Temp 98.5 F (36.9 C) (Oral)   Resp 20   LMP 05/25/2013   SpO2 99%   Physical Exam  Constitutional: She is oriented to person, place, and time. She appears well-developed and well-nourished. No distress.  HENT:  Head: Normocephalic and atraumatic.  Mouth/Throat: Oropharynx is clear and moist.  Eyes: Pupils are equal, round, and reactive to light.  Neck: Normal range of motion. Neck supple.  Cardiovascular: Normal rate, regular rhythm and normal heart sounds. Exam reveals no gallop and no friction rub.  No murmur heard. Pulmonary/Chest: Effort normal. No accessory muscle usage or stridor. No tachypnea. No respiratory distress. She has no decreased breath sounds. She has wheezes. She has no rhonchi. She has no rales.  Abdominal: Soft. Bowel sounds are normal. She exhibits no distension. There is no tenderness.  Neurological: She is alert and oriented to person, place, and time. She exhibits normal muscle tone. Coordination normal.  Skin: Skin is warm and dry. Capillary refill takes less than 2 seconds. No rash noted. No erythema.  Psychiatric: She has a normal mood and affect. Her behavior is normal.  Nursing note and vitals reviewed.    ED Treatments / Results  Labs (all labs ordered are listed, but only abnormal results are displayed) Labs Reviewed - No data to display  EKG  EKG Interpretation None       Radiology Dg Chest 2 View  Result Date: 07/31/2017 CLINICAL DATA:  Asthma, congestion, SOB, CP when coughing hx of bronchitis EXAM: CHEST  2 VIEW COMPARISON:  Chest x-rays dated 06/16/2017 and 06/20/2016. FINDINGS: The heart size and mediastinal contours are within normal limits. Both lungs are clear. The visualized skeletal structures are  unremarkable. IMPRESSION: No active cardiopulmonary disease. No evidence of pneumonia or pulmonary edema. Electronically Signed   By: Franki Cabot M.D.   On: 07/31/2017 17:41    Procedures Procedures (including critical care time)  Medications Ordered in ED Medications  albuterol (PROVENTIL) (2.5 MG/3ML) 0.083% nebulizer solution 5 mg (5 mg Nebulization Given 07/31/17 1705)     Initial Impression / Assessment and Plan / ED Course  I have reviewed the triage vital signs and the nursing notes.  Pertinent labs & imaging results that were available during my care of the patient were reviewed by me and considered in my medical decision making (see chart for details).     Patient will need an hour-long neb treatment along with steroids.  The patient will be reassessed after that.  Patient has been otherwise stable except for slightly low pulse oximetry.  Final Clinical Impressions(s) / ED Diagnoses   Final diagnoses:  None    ED Discharge  Orders    None       Dalia Heading, PA-C 08/01/17 0014    Macarthur Critchley, MD 08/06/17 2330

## 2017-07-31 NOTE — ED Triage Notes (Signed)
Pt to ER for evaluation of shortness of breath related to asthma. States does not have inhalers due to not having a PCP. States for one week this has been going on. Pt audibly wheezing. Inspiratory and expiratory noted when auscultating. SpO2 90% on RA, on neb treatment, 100%

## 2017-08-01 ENCOUNTER — Other Ambulatory Visit: Payer: Self-pay

## 2017-08-01 ENCOUNTER — Encounter (HOSPITAL_COMMUNITY): Payer: Self-pay | Admitting: Internal Medicine

## 2017-08-01 DIAGNOSIS — D649 Anemia, unspecified: Secondary | ICD-10-CM | POA: Diagnosis present

## 2017-08-01 DIAGNOSIS — F209 Schizophrenia, unspecified: Secondary | ICD-10-CM | POA: Insufficient documentation

## 2017-08-01 DIAGNOSIS — E876 Hypokalemia: Secondary | ICD-10-CM | POA: Diagnosis present

## 2017-08-01 DIAGNOSIS — A419 Sepsis, unspecified organism: Secondary | ICD-10-CM | POA: Diagnosis present

## 2017-08-01 DIAGNOSIS — J9601 Acute respiratory failure with hypoxia: Secondary | ICD-10-CM | POA: Diagnosis present

## 2017-08-01 LAB — BASIC METABOLIC PANEL
ANION GAP: 7 (ref 5–15)
Anion gap: 9 (ref 5–15)
BUN: 7 mg/dL (ref 6–20)
BUN: 8 mg/dL (ref 6–20)
CALCIUM: 8.6 mg/dL — AB (ref 8.9–10.3)
CALCIUM: 8.6 mg/dL — AB (ref 8.9–10.3)
CO2: 24 mmol/L (ref 22–32)
CO2: 22 mmol/L (ref 22–32)
CREATININE: 0.69 mg/dL (ref 0.44–1.00)
Chloride: 101 mmol/L (ref 101–111)
Chloride: 108 mmol/L (ref 101–111)
Creatinine, Ser: 0.71 mg/dL (ref 0.44–1.00)
GFR calc Af Amer: >60 mL/min (ref >60)
GFR calc Af Amer: >60 mL/min (ref >60)
GLUCOSE: 179 mg/dL — AB (ref 65–99)
GLUCOSE: 195 mg/dL — AB (ref 65–99)
POTASSIUM: 3.2 mmol/L — AB (ref 3.5–5.1)
Potassium: 4.4 mmol/L (ref 3.5–5.1)
SODIUM: 134 mmol/L — AB (ref 135–145)
Sodium: 137 mmol/L (ref 135–145)

## 2017-08-01 LAB — RESPIRATORY PANEL BY PCR
ADENOVIRUS-RVPPCR: NOT DETECTED
Bordetella pertussis: NOT DETECTED
CORONAVIRUS NL63-RVPPCR: NOT DETECTED
CORONAVIRUS OC43-RVPPCR: NOT DETECTED
Chlamydophila pneumoniae: NOT DETECTED
Coronavirus 229E: NOT DETECTED
Coronavirus HKU1: NOT DETECTED
INFLUENZA A-RVPPCR: NOT DETECTED
INFLUENZA B-RVPPCR: NOT DETECTED
METAPNEUMOVIRUS-RVPPCR: NOT DETECTED
MYCOPLASMA PNEUMONIAE-RVPPCR: NOT DETECTED
PARAINFLUENZA VIRUS 1-RVPPCR: NOT DETECTED
PARAINFLUENZA VIRUS 2-RVPPCR: NOT DETECTED
PARAINFLUENZA VIRUS 3-RVPPCR: NOT DETECTED
PARAINFLUENZA VIRUS 4-RVPPCR: NOT DETECTED
RESPIRATORY SYNCYTIAL VIRUS-RVPPCR: NOT DETECTED
RHINOVIRUS / ENTEROVIRUS - RVPPCR: NOT DETECTED

## 2017-08-01 LAB — CBC WITH DIFFERENTIAL/PLATELET
Basophils Absolute: 0 10*3/uL (ref 0.0–0.1)
Basophils Relative: 0 %
EOS ABS: 0.1 10*3/uL (ref 0.0–0.7)
Eosinophils Relative: 1 %
HCT: 36.3 % (ref 36.0–46.0)
Hemoglobin: 11.7 g/dL — ABNORMAL LOW (ref 12.0–15.0)
LYMPHS ABS: 1.2 10*3/uL (ref 0.7–4.0)
LYMPHS PCT: 9 %
MCH: 28.1 pg (ref 26.0–34.0)
MCHC: 32.2 g/dL (ref 30.0–36.0)
MCV: 87.3 fL (ref 78.0–100.0)
MONO ABS: 0.2 10*3/uL (ref 0.1–1.0)
MONOS PCT: 1 %
Neutro Abs: 11.9 10*3/uL — ABNORMAL HIGH (ref 1.7–7.7)
Neutrophils Relative %: 89 %
Platelets: 269 10*3/uL (ref 150–400)
RBC: 4.16 MIL/uL (ref 3.87–5.11)
RDW: 12.8 % (ref 11.5–15.5)
WBC: 13.3 10*3/uL — AB (ref 4.0–10.5)

## 2017-08-01 LAB — HIV ANTIBODY (ROUTINE TESTING W REFLEX): HIV Screen 4th Generation wRfx: NONREACTIVE

## 2017-08-01 LAB — INFLUENZA PANEL BY PCR (TYPE A & B)
Influenza A By PCR: NEGATIVE
Influenza B By PCR: NEGATIVE

## 2017-08-01 LAB — STREP PNEUMONIAE URINARY ANTIGEN: STREP PNEUMO URINARY ANTIGEN: NEGATIVE

## 2017-08-01 LAB — MAGNESIUM: Magnesium: 2.2 mg/dL (ref 1.7–2.4)

## 2017-08-01 LAB — LACTIC ACID, PLASMA
LACTIC ACID, VENOUS: 6.7 mmol/L — AB (ref 0.5–1.9)
LACTIC ACID, VENOUS: 3.9 mmol/L — AB (ref 0.5–1.9)
Lactic Acid, Venous: 2.2 mmol/L (ref 0.5–1.9)

## 2017-08-01 LAB — PROCALCITONIN: Procalcitonin: <0.1 ng/mL

## 2017-08-01 MED ORDER — POTASSIUM CHLORIDE 20 MEQ/15ML (10%) PO SOLN
40.0000 meq | Freq: Once | ORAL | Status: AC
  Administered 2017-08-01: 40 meq via ORAL
  Filled 2017-08-01: qty 30

## 2017-08-01 MED ORDER — ALBUTEROL SULFATE (2.5 MG/3ML) 0.083% IN NEBU
2.5000 mg | INHALATION_SOLUTION | RESPIRATORY_TRACT | Status: DC | PRN
  Administered 2017-08-01: 2.5 mg via RESPIRATORY_TRACT

## 2017-08-01 MED ORDER — ALBUTEROL SULFATE (2.5 MG/3ML) 0.083% IN NEBU
5.0000 mg | INHALATION_SOLUTION | RESPIRATORY_TRACT | Status: DC | PRN
  Filled 2017-08-01: qty 6

## 2017-08-01 MED ORDER — ALBUTEROL SULFATE (2.5 MG/3ML) 0.083% IN NEBU
5.0000 mg | INHALATION_SOLUTION | Freq: Once | RESPIRATORY_TRACT | Status: AC
  Administered 2017-08-01: 5 mg via RESPIRATORY_TRACT
  Filled 2017-08-01: qty 6

## 2017-08-01 MED ORDER — IPRATROPIUM BROMIDE 0.02 % IN SOLN
0.5000 mg | RESPIRATORY_TRACT | Status: DC
  Administered 2017-08-01 (×2): 0.5 mg via RESPIRATORY_TRACT
  Filled 2017-08-01 (×2): qty 2.5

## 2017-08-01 MED ORDER — METHYLPREDNISOLONE SODIUM SUCC 125 MG IJ SOLR
60.0000 mg | Freq: Three times a day (TID) | INTRAMUSCULAR | Status: DC
  Administered 2017-08-01 – 2017-08-02 (×4): 60 mg via INTRAVENOUS
  Filled 2017-08-01 (×4): qty 2

## 2017-08-01 MED ORDER — ZOLPIDEM TARTRATE 5 MG PO TABS: 5.0000 mg | ORAL_TABLET | Freq: Every evening | ORAL | Status: DC | PRN

## 2017-08-01 MED ORDER — ONDANSETRON HCL 4 MG/2ML IJ SOLN: 4.0000 mg | Freq: Three times a day (TID) | INTRAMUSCULAR | Status: DC | PRN

## 2017-08-01 MED ORDER — AZITHROMYCIN 250 MG PO TABS
250.0000 mg | ORAL_TABLET | Freq: Every day | ORAL | Status: DC
  Administered 2017-08-02: 250 mg via ORAL
  Filled 2017-08-01: qty 1

## 2017-08-01 MED ORDER — SODIUM CHLORIDE 0.9 % IV SOLN
INTRAVENOUS | Status: DC
  Administered 2017-08-01 – 2017-08-02 (×4): via INTRAVENOUS

## 2017-08-01 MED ORDER — ACETAMINOPHEN 325 MG PO TABS: 650.0000 mg | ORAL_TABLET | Freq: Four times a day (QID) | ORAL | Status: DC | PRN

## 2017-08-01 MED ORDER — GUAIFENESIN ER 600 MG PO TB12: 1200.0000 mg | ORAL_TABLET | Freq: Two times a day (BID) | ORAL | Status: DC

## 2017-08-01 MED ORDER — SODIUM CHLORIDE 0.9 % IV BOLUS (SEPSIS): 1000.0000 mL | Freq: Once | INTRAVENOUS | Status: DC

## 2017-08-01 MED ORDER — SODIUM CHLORIDE 0.9 % IV BOLUS (SEPSIS)
2000.0000 mL | Freq: Once | INTRAVENOUS | Status: AC
  Administered 2017-08-01: 2000 mL via INTRAVENOUS

## 2017-08-01 MED ORDER — INFLUENZA VAC SPLIT QUAD 0.5 ML IM SUSY
0.5000 mL | PREFILLED_SYRINGE | INTRAMUSCULAR | Status: AC
  Administered 2017-08-02: 0.5 mL via INTRAMUSCULAR
  Filled 2017-08-01: qty 0.5

## 2017-08-01 MED ORDER — METHYLPREDNISOLONE SODIUM SUCC 125 MG IJ SOLR
125.0000 mg | Freq: Once | INTRAMUSCULAR | Status: AC
  Administered 2017-08-01: 125 mg via INTRAVENOUS
  Filled 2017-08-01: qty 2

## 2017-08-01 MED ORDER — MAGNESIUM SULFATE 2 GM/50ML IV SOLN
2.0000 g | Freq: Once | INTRAVENOUS | Status: AC
  Administered 2017-08-01: 2 g via INTRAVENOUS
  Filled 2017-08-01: qty 50

## 2017-08-01 MED ORDER — IPRATROPIUM BROMIDE 0.02 % IN SOLN
0.5000 mg | Freq: Once | RESPIRATORY_TRACT | Status: AC
  Administered 2017-08-01: 0.5 mg via RESPIRATORY_TRACT
  Filled 2017-08-01: qty 2.5

## 2017-08-01 MED ORDER — ENOXAPARIN SODIUM 40 MG/0.4ML ~~LOC~~ SOLN
40.0000 mg | Freq: Every day | SUBCUTANEOUS | Status: DC
  Administered 2017-08-01 – 2017-08-02 (×2): 40 mg via SUBCUTANEOUS
  Filled 2017-08-01 (×3): qty 0.4

## 2017-08-01 MED ORDER — DM-GUAIFENESIN ER 30-600 MG PO TB12: 1.0000 | ORAL_TABLET | Freq: Two times a day (BID) | ORAL | Status: DC | PRN

## 2017-08-01 MED ORDER — AZITHROMYCIN 250 MG PO TABS
500.0000 mg | ORAL_TABLET | Freq: Every day | ORAL | Status: AC
  Administered 2017-08-01: 500 mg via ORAL
  Filled 2017-08-01: qty 2

## 2017-08-01 MED ORDER — IPRATROPIUM-ALBUTEROL 0.5-2.5 (3) MG/3ML IN SOLN
3.0000 mL | Freq: Four times a day (QID) | RESPIRATORY_TRACT | Status: DC
  Administered 2017-08-01 – 2017-08-02 (×5): 3 mL via RESPIRATORY_TRACT
  Filled 2017-08-01 (×5): qty 3

## 2017-08-01 NOTE — Progress Notes (Signed)
The patient was admitted early this AM after midnight and H and P has been reviewed and I am in agreement with the current Assessment and Plan done by Dr. Ivor Costa. Additional changes to the Plan of Care have been made accordingly. The patient is an obese AAF with a PMH of Asthma, Schizophrenia, Bipolar Disorder, Microcytic Anemia, Chronic Bronchitis, and other comorbids who presented to the ED with a cc of Cough and SOB. Patient has a hx of Asthma and states she has been having a Cough with yellow colored sputum and progressively worsening SOB. She was evaluated and found to be Hypoxic on Room Air.CXR was Negative for Acute Cardiopulmonary Disease. She was admitted for Acute Respiratory Failure with Hypoxia from Asthma Exacerbation. Will continue IV Steroids and Neb Treatments as patient continued to have wheezing. Lactic Acid was Elevated at 6.7 so will continue IVF Rehydration and repeat Lactic Acid later this afternoon. Respiratory Virus Panel and Influenza A/B PCR was negative. Will continue to monitor patient's response to clinical intervention and repeat blood work this Afternoon and in AM.

## 2017-08-01 NOTE — Plan of Care (Signed)
Pt oriented to room, unit, and plan of care. Educated on use of call light system and method of reporting concerns. Verbalizes understanding of education. Pt resting comfortably in bed. Will continue to monitor patient closely.

## 2017-08-01 NOTE — ED Provider Notes (Signed)
Assumed care from [place] at shift change.  See prior notes for full H&P.  Briefly, 36 y.o. F here with asthma exacerbation.  O2 sats on arrival 88%.  Does not use home O2.  CXR was obtained which is negative.    Plan:  Currently on hour long CAT.  Will reassess, if remains hypoxic will need admission.  1:23 AM On repeat assessment, patient still with significant wheezes.  Her oxygen saturation is between 90-92% on room air with good waveform.  When she begins moving in the bed it drops into the high 80s.  Patient has required admission for asthma exacerbations in the past.  No prior intubations.  At this point, she will require admission.  Patient was given oral steroids earlier, will give her IV Solu-Medrol as well as IV magnesium and additional nebs.  Basic blood work pending.  CXR was clear.  Discussed with hospitalist service, Dr. Blaine Hamper-- he will admit for ongoing care.   CRITICAL CARE Performed by: Larene Pickett   Total critical care time: 45 minutes  Critical care time was exclusive of separately billable procedures and treating other patients.  Critical care was necessary to treat or prevent imminent or life-threatening deterioration.  Critical care was time spent personally by me on the following activities: development of treatment plan with patient and/or surrogate as well as nursing, discussions with consultants, evaluation of patient's response to treatment, examination of patient, obtaining history from patient or surrogate, ordering and performing treatments and interventions, ordering and review of laboratory studies, ordering and review of radiographic studies, pulse oximetry and re-evaluation of patient's condition.    Larene Pickett, PA-C 08/01/17 0356    Rex Kras, Wenda Overland, MD 08/01/17 0830

## 2017-08-01 NOTE — H&P (Signed)
History and Physical    Sherri Shaw UXN:[id number] DOB: 07-01-1981 DOA: 07/31/2017  Referring MD/NP/PA:   PCP: Patient, No Pcp Per   Patient coming from:  The patient is coming from home.  At baseline, pt is independent for most of ADL.   Chief Complaint: cough and SOB  HPI: Sherri Shaw is a 36 y.o. female with medical history significant of asthma, schizophrenia, bipolar disorder, anemia, who presents with cough and shortness of breath.  Patient states that she has been having cough and shortness of breath for almost month, which has been progressively getting worse. She coughs up yellow colored sputum. She has runny nose, but no sore throat. She has headache, denies fever or chills. Patient does not have chest pain at rest, but has some chest pain when she has severe coughing. Patient speaks in full sentence. Patient does not have nausea, vomiting, diarrhea or abdominal pain. No symptoms for UTI or unilateral weakness.  ED Course: pt was found to have WBC 13.3, potassium 3.2, creatinine normal, temperature normal, tachycardia, oxygen saturation 88% on room air, negative chest x-ray for infiltration. Patient is placed on telemetry bed for observation.  Review of Systems:   General: no fevers, chills, no body weight gain, has fatigue HEENT: no blurry vision, hearing changes or sore throat Respiratory: has dyspnea, coughing, wheezing CV: has chest pain, no palpitations GI: no nausea, vomiting, abdominal pain, diarrhea, constipation GU: no dysuria, burning on urination, increased urinary frequency, hematuria  Ext: no leg edema Neuro: no unilateral weakness, numbness, or tingling, no vision change or hearing loss Skin: no rash, no skin tear. MSK: No muscle spasm, no deformity, no limitation of range of movement in spin Heme: No easy bruising.  Travel history: No recent long distant travel.  Allergy: No Known Allergies  Past Medical History:  Diagnosis Date  . Anemia   . Asthma     . Bipolar disorder [facility])    sister denies this hx on 04/07/2013  . Chronic bronchitis ([place])    "I get it q yr" (04/07/2013)  . Exertional shortness of breath    ? due to anemia  . History of blood transfusion 04/07/2013   "got my first transfusion today; got 4 units" (04/07/2013)  . Microcytic anemia    Archie Endo 04/07/2013  . Schizophrenia ([place])    "don't really know the type"/sister (04/07/2013)    Past Surgical History:  Procedure Laterality Date  . ABDOMINAL HYSTERECTOMY N/A 06/01/2013   Procedure: HYSTERECTOMY ABDOMINAL;  Surgeon: Lavonia Drafts, MD;  Location: [facility];  Service: Gynecology;  Laterality: N/A;  . BILATERAL SALPINGECTOMY Bilateral 06/01/2013   Procedure: BILATERAL SALPINGECTOMY;  Surgeon: Lavonia Drafts, MD;  Location: [facility];  Service: Gynecology;  Laterality: Bilateral;  . CYSTOSCOPY W/ URETERAL STENT PLACEMENT Bilateral 06/01/2013   Procedure: CYSTOSCOPY WITH BILATERAL STENT REPLACEMENT;  Surgeon: Irine Seal, MD;  Location: [facility];  Service: Urology;  Laterality: Bilateral;  . HYSTERECTOMY ABDOMINAL WITH SALPINGECTOMY    . NO PAST SURGERIES      Social History:  reports that  has never smoked. she has never used smokeless tobacco. She reports that she does not drink alcohol or use drugs.  Family History:  Family History  Problem Relation Age of Onset  . Hypertension Mother   . Diabetes Mother   . Asthma Mother   . Stroke Mother   . Cancer Mother   . Asthma Brother   . Heart disease Maternal Aunt      Prior to Admission medications  Medication Sig Start Date End Date Taking? Authorizing Provider  albuterol (PROVENTIL HFA;VENTOLIN HFA) 108 (90 Base) MCG/ACT inhaler Inhale 2 puffs into the lungs every 4 (four) hours as needed for wheezing or shortness of breath. Patient not taking: Reported on 07/31/2017 06/20/16   Horton, Barbette Hair, MD  azithromycin (ZITHROMAX Z-PAK) 250 MG tablet Take 2 tablets on day 1 and 1 tablet daily for 4 days Patient not  taking: Reported on 07/31/2017 06/16/17   Charlesetta Shanks, MD  ergocalciferol (VITAMIN D2) 50000 units capsule Take 1 capsule (50,000 Units total) by mouth once a week. Patient not taking: Reported on 07/31/2017 10/14/15   Argentina Donovan, PA-C  predniSONE (DELTASONE) 20 MG tablet Take 2 tablets (40 mg total) by mouth daily. Patient not taking: Reported on 07/31/2017 06/20/16   Horton, Barbette Hair, MD  predniSONE (DELTASONE) 20 MG tablet 2 tabs po daily x 4 days Patient not taking: Reported on 07/31/2017 06/16/17   Charlesetta Shanks, MD    Physical Exam: Vitals:   07/31/17 2112 07/31/17 2245 08/01/17 0102 08/01/17 0213  BP:  (!) 141/87 100/65 124/80  Pulse: 87 92 83 81  Resp: 20  17 18   Temp:      TempSrc:      SpO2: 99% 95% 100% 100%   General: Not in acute distress HEENT:       Eyes: PERRL, EOMI, no scleral icterus.       ENT: No discharge from the ears and nose, no pharynx injection, no tonsillar enlargement.        Neck: No JVD, no bruit, no mass felt. Heme: No neck lymph node enlargement. Cardiac: S1/S2, RRR, No murmurs, No gallops or rubs. Respiratory:  Has wheezing bilaterally. GI: Soft, nondistended, nontender, no rebound pain, no organomegaly, BS present. GU: No hematuria Ext: No pitting leg edema bilaterally. 2+DP/PT pulse bilaterally. Musculoskeletal: No joint deformities, No joint redness or warmth, no limitation of ROM in spin. Skin: No rashes.  Neuro: Alert, oriented X3, cranial nerves II-XII grossly intact, moves all extremities normally. Psych: Patient is not psychotic, no suicidal or hemocidal ideation.  Labs on Admission: I have personally reviewed following labs and imaging studies  CBC: Recent Labs  Lab 08/01/17 0203  WBC 13.3*  NEUTROABS 11.9*  HGB 11.7*  HCT 36.3  MCV 87.3  PLT 742   Basic Metabolic Panel: Recent Labs  Lab 08/01/17 0203  NA 134*  K 3.2*  CL 101  CO2 24  GLUCOSE 179*  BUN 7  CREATININE 0.69  CALCIUM 8.6*   GFR: CrCl  cannot be calculated (Unknown ideal weight.). Liver Function Tests: No results for input(s): AST, ALT, ALKPHOS, BILITOT, PROT, ALBUMIN in the last 168 hours. No results for input(s): LIPASE, AMYLASE in the last 168 hours. No results for input(s): AMMONIA in the last 168 hours. Coagulation Profile: No results for input(s): INR, PROTIME in the last 168 hours. Cardiac Enzymes: No results for input(s): CKTOTAL, CKMB, CKMBINDEX, TROPONINI in the last 168 hours. BNP (last 3 results) No results for input(s): PROBNP in the last 8760 hours. HbA1C: No results for input(s): HGBA1C in the last 72 hours. CBG: No results for input(s): GLUCAP in the last 168 hours. Lipid Profile: No results for input(s): CHOL, HDL, LDLCALC, TRIG, CHOLHDL, LDLDIRECT in the last 72 hours. Thyroid Function Tests: No results for input(s): TSH, T4TOTAL, FREET4, T3FREE, THYROIDAB in the last 72 hours. Anemia Panel: No results for input(s): VITAMINB12, FOLATE, FERRITIN, TIBC, IRON, RETICCTPCT in the last 72 hours. Urine  analysis:    Component Value Date/Time   COLORURINE YELLOW 04/06/2013 2155   APPEARANCEUR CLOUDY (A) 04/06/2013 2155   LABSPEC 1.020 04/06/2013 2155   PHURINE 7.5 04/06/2013 2155   GLUCOSEU NEGATIVE 04/06/2013 2155   HGBUR NEGATIVE 04/06/2013 2155   BILIRUBINUR NEGATIVE 04/06/2013 2155   KETONESUR NEGATIVE 04/06/2013 2155   PROTEINUR NEGATIVE 04/06/2013 2155   UROBILINOGEN 1.0 04/06/2013 2155   NITRITE NEGATIVE 04/06/2013 2155   LEUKOCYTESUR MODERATE (A) 04/06/2013 2155   Sepsis Labs: @LABRCNTIP (procalcitonin:4,lacticidven:4) )No results found for this or any previous visit (from the past 240 hour(s)).   Radiological Exams on Admission: Dg Chest 2 View  Result Date: 07/31/2017 CLINICAL DATA:  Asthma, congestion, SOB, CP when coughing hx of bronchitis EXAM: CHEST  2 VIEW COMPARISON:  Chest x-rays dated 06/16/2017 and 06/20/2016. FINDINGS: The heart size and mediastinal contours are within normal  limits. Both lungs are clear. The visualized skeletal structures are unremarkable. IMPRESSION: No active cardiopulmonary disease. No evidence of pneumonia or pulmonary edema. Electronically Signed   By: Franki Cabot M.D.   On: 07/31/2017 17:41     EKG:   Not done in ED, will get one.   Assessment/Plan Principal Problem:   Asthma exacerbation Active Problems:   Bipolar disorder (HCC)   Anemia   Sepsis ([place])   Hypokalemia   Acute respiratory failure with hypoxia (HCC)   Acute respiratory failure with hypoxia due to asthma exacerbation and sepsis: Patient has productive cough, shortness of breath, wheezing on auscultation, negative chest x-ray, clinically consistent with asthma exacerbation. She meets criteria for sepsis with leukocytosis, tachycardia. Pending lactic acid level. Currently hemodynamically stable.  -will place on tele bed for obs -Nebulizers: scheduled Duoneb and prn albuterol -Solu-Medrol 60 mg IV q8h  -Z pak -Mucinex for cough  -Urine S. pneumococcal antigen -Follow up blood culture x2, sputum culture, respiratory virus panel, Flu pcr -will get Procalcitonin and trend lactic acid levels per sepsis protocol. -IVF: 2L of NS bolus in ED, followed by 100 cc/h   Hypokalemia: K= 3.2 on admission. - Repleted - pt received 2 g of magnesium sulfate for asthma exacerbation in ED  Bipolar disorder [facility]): not taking meds at home. Seems stable -observe   Anemia: Hemoglobin stable, 11.7 -Follow-up CBC  DVT ppx:   SQ Lovenox Code Status: Full code Family Communication: None at bed side.  Disposition Plan:  Anticipate discharge back to previous home environment Consults called:  none Admission status: Obs / tele    Date of Service 08/01/2017    Ivor Costa [facility] Pager [phone]  If 7PM-7AM, please contact night-coverage [email or web address] Password [email or web address] 08/01/2017, 3:26 AM

## 2017-08-01 NOTE — Progress Notes (Signed)
CRITICAL VALUE ALERT  Critical Value: lactic acid 6.7  Date & Time Notied:  08/01/17 @0830   Provider Notified: Dr. Alfredia Ferguson notified at [phone]  Orders Received/Actions taken: BMP, Magnesium and lactic acid labs ordered

## 2017-08-01 NOTE — Progress Notes (Signed)
Pt ambulated to bathroom with standby assistance. Pt tolerated well. Will continue to monitor.

## 2017-08-02 DIAGNOSIS — J9601 Acute respiratory failure with hypoxia: Secondary | ICD-10-CM

## 2017-08-02 DIAGNOSIS — E876 Hypokalemia: Secondary | ICD-10-CM

## 2017-08-02 DIAGNOSIS — F319 Bipolar disorder, unspecified: Secondary | ICD-10-CM

## 2017-08-02 DIAGNOSIS — F209 Schizophrenia, unspecified: Secondary | ICD-10-CM

## 2017-08-02 DIAGNOSIS — D649 Anemia, unspecified: Secondary | ICD-10-CM

## 2017-08-02 DIAGNOSIS — J4541 Moderate persistent asthma with (acute) exacerbation: Principal | ICD-10-CM

## 2017-08-02 DIAGNOSIS — R6510 Systemic inflammatory response syndrome (SIRS) of non-infectious origin without acute organ dysfunction: Secondary | ICD-10-CM

## 2017-08-02 LAB — CBC WITH DIFFERENTIAL/PLATELET
Basophils Absolute: 0 10*3/uL (ref 0.0–0.1)
Basophils Relative: 0 %
EOS PCT: 0 %
Eosinophils Absolute: 0 10*3/uL (ref 0.0–0.7)
HCT: 35.2 % — ABNORMAL LOW (ref 36.0–46.0)
HEMOGLOBIN: 11.4 g/dL — AB (ref 12.0–15.0)
LYMPHS ABS: 1.3 10*3/uL (ref 0.7–4.0)
LYMPHS PCT: 6 %
MCH: 28.6 pg (ref 26.0–34.0)
MCHC: 32.4 g/dL (ref 30.0–36.0)
MCV: 88.2 fL (ref 78.0–100.0)
Monocytes Absolute: 1.1 10*3/uL — ABNORMAL HIGH (ref 0.1–1.0)
Monocytes Relative: 5 %
Neutro Abs: 20 10*3/uL — ABNORMAL HIGH (ref 1.7–7.7)
Neutrophils Relative %: 89 %
PLATELETS: 243 10*3/uL (ref 150–400)
RBC: 3.99 MIL/uL (ref 3.87–5.11)
RDW: 13.5 % (ref 11.5–15.5)
WBC: 22.4 10*3/uL — AB (ref 4.0–10.5)

## 2017-08-02 LAB — COMPREHENSIVE METABOLIC PANEL
ALK PHOS: 44 U/L (ref 38–126)
ALT: 14 U/L (ref 14–54)
AST: 25 U/L (ref 15–41)
Albumin: 3.4 g/dL — ABNORMAL LOW (ref 3.5–5.0)
Anion gap: 11 (ref 5–15)
BUN: 7 mg/dL (ref 6–20)
CALCIUM: 8.5 mg/dL — AB (ref 8.9–10.3)
CO2: 21 mmol/L — ABNORMAL LOW (ref 22–32)
CREATININE: 0.79 mg/dL (ref 0.44–1.00)
Chloride: 106 mmol/L (ref 101–111)
Glucose, Bld: 130 mg/dL — ABNORMAL HIGH (ref 65–99)
Potassium: 3.8 mmol/L (ref 3.5–5.1)
Sodium: 138 mmol/L (ref 135–145)
TOTAL PROTEIN: 6.8 g/dL (ref 6.5–8.1)
Total Bilirubin: 0.5 mg/dL (ref 0.3–1.2)

## 2017-08-02 LAB — LACTIC ACID, PLASMA: Lactic Acid, Venous: 1.8 mmol/L (ref 0.5–1.9)

## 2017-08-02 LAB — MAGNESIUM: MAGNESIUM: 2 mg/dL (ref 1.7–2.4)

## 2017-08-02 LAB — PHOSPHORUS: PHOSPHORUS: 2.5 mg/dL (ref 2.5–4.6)

## 2017-08-02 MED ORDER — ALBUTEROL SULFATE HFA 108 (90 BASE) MCG/ACT IN AERS: 2.0000 | INHALATION_SPRAY | RESPIRATORY_TRACT | 0 refills | Status: DC | PRN

## 2017-08-02 MED ORDER — IPRATROPIUM-ALBUTEROL 0.5-2.5 (3) MG/3ML IN SOLN: 3.0000 mL | Freq: Three times a day (TID) | RESPIRATORY_TRACT | Status: DC

## 2017-08-02 MED ORDER — PREDNISONE 10 MG (21) PO TBPK: ORAL_TABLET | ORAL | 0 refills | Status: DC

## 2017-08-02 MED ORDER — IPRATROPIUM-ALBUTEROL 0.5-2.5 (3) MG/3ML IN SOLN: 3.0000 mL | Freq: Four times a day (QID) | RESPIRATORY_TRACT | 0 refills | Status: DC | PRN

## 2017-08-02 MED ORDER — AZITHROMYCIN 250 MG PO TABS: 250.0000 mg | ORAL_TABLET | Freq: Every day | ORAL | 0 refills | Status: AC

## 2017-08-02 MED ORDER — ALBUTEROL SULFATE (2.5 MG/3ML) 0.083% IN NEBU: 2.5000 mg | INHALATION_SOLUTION | RESPIRATORY_TRACT | Status: DC | PRN

## 2017-08-02 MED ORDER — SODIUM CHLORIDE 0.9 % IV BOLUS (SEPSIS)
1000.0000 mL | Freq: Once | INTRAVENOUS | Status: AC
  Administered 2017-08-02: 1000 mL via INTRAVENOUS

## 2017-08-02 MED ORDER — DM-GUAIFENESIN ER 30-600 MG PO TB12: 1.0000 | ORAL_TABLET | Freq: Two times a day (BID) | ORAL | 0 refills | Status: DC | PRN

## 2017-08-02 NOTE — Progress Notes (Signed)
Pt discharged to home. PIV removed, AVS reviewed. Home nebulizer delivered to pt prior to dc. Pt left unit with neb and personal belongings, prescriptions and [facility] letter. Pt to follow up with new PCP appointment.

## 2017-08-02 NOTE — Progress Notes (Signed)
Pts lactic acid was reported to [facility] as being elevated. New lab orders and NS bolus orders.

## 2017-08-02 NOTE — Care Management Note (Signed)
Case Management Note  Patient Details  Name: Sherri Shaw MRN: [id number] Date of Birth: Jan 15, 1981  Subjective/Objective:                 Spoke to patient at bedside. Appointment made at [facility], entered on AVS. Patient provided with [facility] letter and order placed for nebulizer per CM consult. It will be dleivered to room prior to DC.    Action/Plan:   Expected Discharge Date:                  Expected Discharge Plan:  Home/Self Care  In-House Referral:     Discharge planning Services  CM Consult, [facility], [facility]  Post Acute Care Choice:    Choice offered to:     DME Arranged:    DME Agency:     HH Arranged:    HH Agency:     Status of Service:  Completed, signed off  If discussed at [employer], dates discussed:    Additional Comments:  Carles Collet, RN 08/02/2017, 10:48 AM

## 2017-08-02 NOTE — Discharge Summary (Signed)
Physician Discharge Summary  Sherri Shaw JKK:[id number] DOB: 10/11/1980 DOA: 07/31/2017  PCP: Patient, No Pcp Per  Admit date: 07/31/2017 Discharge date: 08/02/2017  Admitted From: Home Disposition:  Home  Recommendations for Outpatient Follow-up:  1. Follow up and Establish with PCP in 1-2 weeks; Appointment made 2. Please obtain CMP/CBC, Mag, Phos in one week 3. Repeat CXR as an outpatient if necessary 4. Please follow up on the following pending results:  Home Health: No  Equipment/Devices: None   Discharge Condition: Stable  CODE STATUS: FULL CODE Diet recommendation: Heart Healthy Diet  Brief/Interim Summary: Sherri Shaw is a 36 y.o. female with medical history significant of asthma, schizophrenia, bipolar disorder, anemia, who presented with cough and shortness of breath.  Patient states that she has been having cough and shortness of breath for almost month, which has been progressively getting worse. She coughs up yellow colored sputum. She has runny nose, but no sore throat. She has headache, denies fever or chills. Patient does not have chest pain at rest, but has some chest pain when she has severe coughing. Patient speaks in full sentence. She was admitted for an Asthma Exacerbation and treated with Nebs, Steroids, and placed on Azithromycin with improvement. She ambulated without desaturating and was deemed medically stable to D/C home and will be D/C'd with a Home Nebulizer and prn DuoNebs as well as be written for a refill for her rescue inhaler. She will need to follow up with PCP at D/C.   Discharge Diagnoses:  Principal Problem:   Asthma exacerbation Active Problems:   Bipolar disorder ([place])   Anemia   Sepsis ([place])   Hypokalemia   Acute respiratory failure with hypoxia (HCC)  Acute Fespiratory Failure with hypoxia due to asthma exacerbation:  -Patient has productive cough, shortness of breath, wheezing on auscultation, negative chest x-ray, clinically  consistent with asthma exacerbation.  -She meets criteria for SIRS with leukocytosis, tachycardia. Lactic Level was 6.7. Remained Hemodynamically Stable -Admitted to [facility] with Telemetry  -Nebulizers: scheduled Duoneb and prn albuterol; Given Nebulizer and Albuterol Inhaler at D/C -Solu-Medrol 60 mg IV q8h changed to po Sterapred -C/w Azithromycin at D/C  -Mucinex for cough  -Urine S. pneumococcal antigen Negative -Influenza A/B and Respiratory Virus Panel Negative  -Follow up blood culture x2 showed NGTD at 1 day -Sputum Cx -IVF: 2L of NS bolus in ED, followed by 100 cc/h and adequately rehydrated  -Walk Screen done and patient did not require any Home O2 -Follow up with PCP   SIRS from Asthma Exacerbation She meets criteria for SIRS with leukocytosis, tachycardia. Lactic Level was 6.7. Remained Hemodynamically Stable -IVF: 2L of NS bolus in ED, followed by 100 cc/h and adequately rehydrated  -SIRS improved   Hypokalemia:  -K= 3.2 on admission and improved to 3.6=8 -Follow up with PCP for Repeat CMP  Bipolar disorder [facility]):  -Not taking meds at home. Seems stable -Follow up PCP   Microcytic Anemia:  -Hb/Hct went from 11.7/36.3 -> 11.4/25.2 -Hb Stabel -Outpatient work up with Anemia Panel and repeat CBC at PCP office   Leukocytosis -WBC went from 13.3 -> 22.4 -Likely from Steroid Demargination from IV Steroids -No S/Sx of Infection -Follow up PCP CMP  Lactic Acidosis -Patiant's Lactic Acid went to 6.7 -Improved with IV Hydration and now 1.8  Discharge Instructions  Discharge Instructions    Call MD for:  difficulty breathing, headache or visual disturbances   Complete by:  As directed    Call MD for:  extreme fatigue  Complete by:  As directed    Call MD for:  hives   Complete by:  As directed    Call MD for:  persistant dizziness or light-headedness   Complete by:  As directed    Call MD for:  persistant nausea and vomiting   Complete by:  As directed     Call MD for:  redness, tenderness, or signs of infection (pain, swelling, redness, odor or green/yellow discharge around incision site)   Complete by:  As directed    Call MD for:  severe uncontrolled pain   Complete by:  As directed    Call MD for:  temperature >100.4   Complete by:  As directed    Diet - low sodium heart healthy   Complete by:  As directed    Discharge instructions   Complete by:  As directed    Follow up and Establish with PCP. Take all medications as prescribed. If symptoms change or worsen please return to the ED for evaluation.   Increase activity slowly   Complete by:  As directed      Allergies as of 08/02/2017   No Known Allergies     Medication List    STOP taking these medications   predniSONE 20 MG tablet Commonly known as:  DELTASONE Replaced by:  predniSONE 10 MG (21) Tbpk tablet     TAKE these medications   albuterol 108 (90 Base) MCG/ACT inhaler Commonly known as:  PROVENTIL HFA;VENTOLIN HFA Inhale 2 puffs into the lungs every 4 (four) hours as needed for wheezing or shortness of breath.   azithromycin 250 MG tablet Commonly known as:  ZITHROMAX Take 1 tablet (250 mg total) by mouth daily for 4 days. What changed:    how much to take  how to take this  when to take this  additional instructions   dextromethorphan-guaiFENesin 30-600 MG 12hr tablet Commonly known as:  MUCINEX DM Take 1 tablet by mouth 2 (two) times daily as needed for cough.   ergocalciferol 50000 units capsule Commonly known as:  VITAMIN D2 Take 1 capsule (50,000 Units total) by mouth once a week.   ipratropium-albuterol 0.5-2.5 (3) MG/3ML Soln Commonly known as:  DUONEB Take 3 mLs by nebulization every 6 (six) hours as needed.   predniSONE 10 MG (21) Tbpk tablet Commonly known as:  STERAPRED UNI-PAK 21 TAB Take 6 Tablets Day 1, 5 Tablets Day 2, 4 Tables Day 3, 3 Tablets Day 4, 2 Tablets Day 5, 1 Tablet Day 6, and Stop Day 7 Replaces:  predniSONE 20 MG  tablet      Follow-up [place]. Go on 08/15/2017.   Specialty:  Internal Medicine Why:  09:30 appointment time. Please call to reschedule if you cannot make it to appointment. This is to establish your primary care provider.  Contact information: [facility] [place] [facility].   Why:  nebulizer to be delivered to room prior to DC Contact information: [address] [address] [phone]          No Known Allergies  Consultations:  None  Procedures/Studies: Dg Chest 2 View  Result Date: 07/31/2017 CLINICAL DATA:  Asthma, congestion, SOB, CP when coughing hx of bronchitis EXAM: CHEST  2 VIEW COMPARISON:  Chest x-rays dated 06/16/2017 and 06/20/2016. FINDINGS: The heart size and mediastinal contours are within  normal limits. Both lungs are clear. The visualized skeletal structures are unremarkable. IMPRESSION: No active cardiopulmonary disease. No evidence of pneumonia or pulmonary edema. Electronically Signed   By: Franki Cabot M.D.   On: 07/31/2017 17:41    Subjective: Seen and examined and was breathing better. No CP. States she is coughing up sputum. Still wheezing some but not as bad as she was. Ready to go home.  Discharge Exam: Vitals:   08/02/17 1124 08/02/17 1439  BP:  123/83  Pulse:  63  Resp:  19  Temp:  97.9 F (36.6 C)  SpO2: 99% 98%   Vitals:   08/02/17 0955 08/02/17 0957 08/02/17 1124 08/02/17 1439  BP:    123/83  Pulse: 95 79  63  Resp: (!) 25 19  19   Temp:    97.9 F (36.6 C)  TempSrc:    Oral  SpO2: 97% 99% 99% 98%  Weight:      Height:       General: Pt is alert, awake, not in acute distress Cardiovascular: RRR, S1/S2 +, no rubs, no gallops Respiratory: Diminished bilaterally with some wheezing, no rhonchi; Patient not tachypenic  Abdominal: Soft, NT, ND, bowel sounds + Extremities: no edema, no  cyanosis  The results of significant diagnostics from this hospitalization (including imaging, microbiology, ancillary and laboratory) are listed below for reference.    Microbiology: Recent Results (from the past 240 hour(s))  Respiratory Panel by PCR     Status: None   Collection Time: 08/01/17  3:23 AM  Result Value Ref Range Status   Adenovirus NOT DETECTED NOT DETECTED Final   Coronavirus 229E NOT DETECTED NOT DETECTED Final   Coronavirus HKU1 NOT DETECTED NOT DETECTED Final   Coronavirus NL63 NOT DETECTED NOT DETECTED Final   Coronavirus OC43 NOT DETECTED NOT DETECTED Final   Metapneumovirus NOT DETECTED NOT DETECTED Final   Rhinovirus / Enterovirus NOT DETECTED NOT DETECTED Final   Influenza A NOT DETECTED NOT DETECTED Final   Influenza B NOT DETECTED NOT DETECTED Final   Parainfluenza Virus 1 NOT DETECTED NOT DETECTED Final   Parainfluenza Virus 2 NOT DETECTED NOT DETECTED Final   Parainfluenza Virus 3 NOT DETECTED NOT DETECTED Final   Parainfluenza Virus 4 NOT DETECTED NOT DETECTED Final   Respiratory Syncytial Virus NOT DETECTED NOT DETECTED Final   Bordetella pertussis NOT DETECTED NOT DETECTED Final   Chlamydophila pneumoniae NOT DETECTED NOT DETECTED Final   Mycoplasma pneumoniae NOT DETECTED NOT DETECTED Final  Culture, blood (x 2)     Status: None (Preliminary result)   Collection Time: 08/01/17  5:44 AM  Result Value Ref Range Status   Specimen Description BLOOD RIGHT ANTECUBITAL  Final   Special Requests IN PEDIATRIC BOTTLE Blood Culture adequate volume  Final   Culture NO GROWTH 1 DAY  Final   Report Status PENDING  Incomplete  Culture, blood (x 2)     Status: None (Preliminary result)   Collection Time: 08/01/17  6:08 AM  Result Value Ref Range Status   Specimen Description BLOOD RIGHT HAND  Final   Special Requests IN PEDIATRIC BOTTLE Blood Culture adequate volume  Final   Culture NO GROWTH 1 DAY  Final   Report Status PENDING  Incomplete    Labs: BNP  (last 3 results) No results for input(s): BNP in the last 8760 hours. Basic Metabolic Panel: Recent Labs  Lab 08/01/17 0203 08/01/17 1501 08/02/17 0846  NA 134* 137 138  K 3.2* 4.4 3.8  CL  101 108 106  CO2 24 22 21*  GLUCOSE 179* 195* 130*  BUN 7 8 7   CREATININE 0.69 0.71 0.79  CALCIUM 8.6* 8.6* 8.5*  MG  --  2.2 2.0  PHOS  --   --  2.5   Liver Function Tests: Recent Labs  Lab 08/02/17 0846  AST 25  ALT 14  ALKPHOS 44  BILITOT 0.5  PROT 6.8  ALBUMIN 3.4*   No results for input(s): LIPASE, AMYLASE in the last 168 hours. No results for input(s): AMMONIA in the last 168 hours. CBC: Recent Labs  Lab 08/01/17 0203 08/02/17 0846  WBC 13.3* 22.4*  NEUTROABS 11.9* 20.0*  HGB 11.7* 11.4*  HCT 36.3 35.2*  MCV 87.3 88.2  PLT 269 243   Cardiac Enzymes: No results for input(s): CKTOTAL, CKMB, CKMBINDEX, TROPONINI in the last 168 hours. BNP: Invalid input(s): POCBNP CBG: No results for input(s): GLUCAP in the last 168 hours. D-Dimer No results for input(s): DDIMER in the last 72 hours. Hgb A1c No results for input(s): HGBA1C in the last 72 hours. Lipid Profile No results for input(s): CHOL, HDL, LDLCALC, TRIG, CHOLHDL, LDLDIRECT in the last 72 hours. Thyroid function studies No results for input(s): TSH, T4TOTAL, T3FREE, THYROIDAB in the last 72 hours.  Invalid input(s): FREET3 Anemia work up No results for input(s): VITAMINB12, FOLATE, FERRITIN, TIBC, IRON, RETICCTPCT in the last 72 hours. Urinalysis    Component Value Date/Time   COLORURINE YELLOW 04/06/2013 2155   APPEARANCEUR CLOUDY (A) 04/06/2013 2155   LABSPEC 1.020 04/06/2013 2155   PHURINE 7.5 04/06/2013 2155   GLUCOSEU NEGATIVE 04/06/2013 2155   HGBUR NEGATIVE 04/06/2013 2155   BILIRUBINUR NEGATIVE 04/06/2013 2155   KETONESUR NEGATIVE 04/06/2013 2155   PROTEINUR NEGATIVE 04/06/2013 2155   UROBILINOGEN 1.0 04/06/2013 2155   NITRITE NEGATIVE 04/06/2013 2155   LEUKOCYTESUR MODERATE (A) 04/06/2013  2155   Sepsis Labs Invalid input(s): PROCALCITONIN,  WBC,  LACTICIDVEN Microbiology Recent Results (from the past 240 hour(s))  Respiratory Panel by PCR     Status: None   Collection Time: 08/01/17  3:23 AM  Result Value Ref Range Status   Adenovirus NOT DETECTED NOT DETECTED Final   Coronavirus 229E NOT DETECTED NOT DETECTED Final   Coronavirus HKU1 NOT DETECTED NOT DETECTED Final   Coronavirus NL63 NOT DETECTED NOT DETECTED Final   Coronavirus OC43 NOT DETECTED NOT DETECTED Final   Metapneumovirus NOT DETECTED NOT DETECTED Final   Rhinovirus / Enterovirus NOT DETECTED NOT DETECTED Final   Influenza A NOT DETECTED NOT DETECTED Final   Influenza B NOT DETECTED NOT DETECTED Final   Parainfluenza Virus 1 NOT DETECTED NOT DETECTED Final   Parainfluenza Virus 2 NOT DETECTED NOT DETECTED Final   Parainfluenza Virus 3 NOT DETECTED NOT DETECTED Final   Parainfluenza Virus 4 NOT DETECTED NOT DETECTED Final   Respiratory Syncytial Virus NOT DETECTED NOT DETECTED Final   Bordetella pertussis NOT DETECTED NOT DETECTED Final   Chlamydophila pneumoniae NOT DETECTED NOT DETECTED Final   Mycoplasma pneumoniae NOT DETECTED NOT DETECTED Final  Culture, blood (x 2)     Status: None (Preliminary result)   Collection Time: 08/01/17  5:44 AM  Result Value Ref Range Status   Specimen Description BLOOD RIGHT ANTECUBITAL  Final   Special Requests IN PEDIATRIC BOTTLE Blood Culture adequate volume  Final   Culture NO GROWTH 1 DAY  Final   Report Status PENDING  Incomplete  Culture, blood (x 2)     Status: None (Preliminary result)  Collection Time: 08/01/17  6:08 AM  Result Value Ref Range Status   Specimen Description BLOOD RIGHT HAND  Final   Special Requests IN PEDIATRIC BOTTLE Blood Culture adequate volume  Final   Culture NO GROWTH 1 DAY  Final   Report Status PENDING  Incomplete   Time coordinating discharge: 35 minutes  SIGNED:  Kerney Elbe, DO [facility] 08/02/2017, 7:59  PM Pager [phone]  If 7PM-7AM, please contact night-coverage [email or web address] Password [email or web address]

## 2017-08-02 NOTE — Progress Notes (Signed)
SATURATION QUALIFICATIONS: (This note is used to comply with regulatory documentation for home oxygen)  Patient Saturations on Room Air at Rest = 99%  Patient Saturations on Room Air while Ambulating = 94-100%

## 2017-08-06 LAB — CULTURE, BLOOD (ROUTINE X 2)
CULTURE: NO GROWTH
Culture: NO GROWTH
Special Requests: ADEQUATE
Special Requests: ADEQUATE

## 2017-08-15 ENCOUNTER — Ambulatory Visit (INDEPENDENT_AMBULATORY_CARE_PROVIDER_SITE_OTHER): Payer: Self-pay | Admitting: Family Medicine

## 2017-08-15 ENCOUNTER — Encounter: Payer: Self-pay | Admitting: Family Medicine

## 2017-08-15 VITALS — BP 97/50 | HR 69 | Temp 98.2°F | Resp 16 | Ht 59.5 in | Wt 176.0 lb

## 2017-08-15 DIAGNOSIS — D649 Anemia, unspecified: Secondary | ICD-10-CM

## 2017-08-15 DIAGNOSIS — Z23 Encounter for immunization: Secondary | ICD-10-CM

## 2017-08-15 DIAGNOSIS — E669 Obesity, unspecified: Secondary | ICD-10-CM

## 2017-08-15 DIAGNOSIS — J4530 Mild persistent asthma, uncomplicated: Secondary | ICD-10-CM

## 2017-08-15 DIAGNOSIS — Z131 Encounter for screening for diabetes mellitus: Secondary | ICD-10-CM

## 2017-08-15 LAB — POCT GLYCOSYLATED HEMOGLOBIN (HGB A1C): HEMOGLOBIN A1C: 5.2

## 2017-08-15 MED ORDER — IPRATROPIUM BROMIDE 0.02 % IN SOLN
0.5000 mg | Freq: Once | RESPIRATORY_TRACT | Status: AC
  Administered 2017-08-15: 0.5 mg via RESPIRATORY_TRACT

## 2017-08-15 MED ORDER — MONTELUKAST SODIUM 10 MG PO TABS: 10.0000 mg | ORAL_TABLET | Freq: Every day | ORAL | 5 refills | Status: DC

## 2017-08-15 MED ORDER — FLUTICASONE FUROATE-VILANTEROL 100-25 MCG/INH IN AEPB: 1.0000 | INHALATION_SPRAY | Freq: Every day | RESPIRATORY_TRACT | 5 refills | Status: DC

## 2017-08-15 MED ORDER — ALBUTEROL SULFATE HFA 108 (90 BASE) MCG/ACT IN AERS: 2.0000 | INHALATION_SPRAY | RESPIRATORY_TRACT | 5 refills | Status: DC | PRN

## 2017-08-15 MED ORDER — ALBUTEROL SULFATE (2.5 MG/3ML) 0.083% IN NEBU
2.5000 mg | INHALATION_SOLUTION | Freq: Once | RESPIRATORY_TRACT | Status: AC
  Administered 2017-08-15: 2.5 mg via RESPIRATORY_TRACT

## 2017-08-15 NOTE — Patient Instructions (Signed)
For mild persistent asthma, will start the following medication regimen.   Breo ellipta 1 puff in am Singular 10 mg at bedtime   You are up to date on all vaccinations  You are anemia. Recommend an iron rich diet. Includes: organ meats, green leafy veggies, beans, and/or protein sources (peanut butter, legumes, etc)  You will return in 1 month for labs    You will return in 3 months for pap smear

## 2017-08-15 NOTE — Progress Notes (Signed)
Subjective:    Patient ID: Sherri Shaw, female    DOB: 1980-09-27, 37 y.o.   MRN: [id number]  HPI  Sherri Shaw, a 37 year old female with a history of asthma and anemia presents to establish care. Patient has primarily been using the emergency department for all primary needs. Patient has had frequent emergency room visits for asthma exacerbation.  Asthma exacerbation is typically triggered by changes in weather. Medications used in the past have included short acting bronchodilators. Symptoms have imporved since most recent emergency room visit. Patient denies shortness of breath, wheezing, or persistent coughing. She also has a history of anemia. Anemia was found by ER visit.  It has been present for several months.      Past Medical History:  Diagnosis Date  . Anemia   . Asthma   . Bipolar disorder [facility])    sister denies this hx on 04/07/2013  . Chronic bronchitis ([place])    "I get it q yr" (04/07/2013)  . Exertional shortness of breath    ? due to anemia  . History of blood transfusion 04/07/2013   "got my first transfusion today; got 4 units" (04/07/2013)  . Microcytic anemia    Archie Endo 04/07/2013  . Schizophrenia ([place])    "don't really know the type"/sister (04/07/2013)   Immunization History  Administered Date(s) Administered  . Influenza,inj,Quad PF,6+ Mos 09/04/2014, 08/02/2017  . Pneumococcal Polysaccharide-23 04/08/2013, 09/04/2014  . Tdap 08/15/2017   Review of Systems  Constitutional: Negative.   HENT: Negative.   Respiratory: Negative.  Negative for chest tightness, shortness of breath and wheezing.   Cardiovascular: Negative.   Gastrointestinal: Negative.   Endocrine: Negative for polydipsia, polyphagia and polyuria.  Genitourinary: Negative.   Musculoskeletal: Negative.   Skin: Negative.   Neurological: Negative.   Hematological: Negative.   Psychiatric/Behavioral: Negative.        Objective:   Physical Exam  Constitutional: She is oriented to person, place,  and time. She appears well-developed and well-nourished.  HENT:  Head: Normocephalic.  Eyes: Pupils are equal, round, and reactive to light.  Neck: Normal range of motion. Neck supple.  Cardiovascular: Normal rate and normal heart sounds.  Pulmonary/Chest: Effort normal and breath sounds normal. No respiratory distress. She has no wheezes.  Abdominal: Soft. Bowel sounds are normal.  Musculoskeletal: Normal range of motion.  Neurological: She is alert and oriented to person, place, and time. She has normal reflexes.  Skin: Skin is warm and dry.  Psychiatric: She has a normal mood and affect. Her behavior is normal. Judgment and thought content normal.      BP (!) 97/50 (BP Location: Left Arm, Patient Position: Sitting, Cuff Size: Large)   Pulse 69   Temp 98.2 F (36.8 C) (Oral)   Resp 16   Ht 4' 11.5" (1.511 m)   Wt 176 lb (79.8 kg)   LMP 05/25/2013   SpO2 100%   BMI 34.95 kg/m  Assessment & Plan:  1. Mild persistent asthma without complication - albuterol (PROVENTIL HFA;VENTOLIN HFA) 108 (90 Base) MCG/ACT inhaler; Inhale 2 puffs into the lungs every 4 (four) hours as needed for wheezing or shortness of breath.  Dispense: 1 Inhaler; Refill: 5 - montelukast (SINGULAIR) 10 MG tablet; Take 1 tablet (10 mg total) by mouth at bedtime.  Dispense: 30 tablet; Refill: 5 - fluticasone furoate-vilanterol (BREO ELLIPTA) 100-25 MCG/INH AEPB; Inhale 1 puff into the lungs daily.  Dispense: 60 each; Refill: 5 - albuterol (PROVENTIL) (2.5 MG/3ML) 0.083% nebulizer solution 2.5  mg - ipratropium (ATROVENT) nebulizer solution 0.5 mg  2. Anemia, unspecified type - CBC; Future  3. Obesity (BMI 30-39.9) Recommend a lowfat, low carbohydrate diet divided over 5-6 small meals, increase water intake to 6-8 glasses, and 150 minutes per week of cardiovascular exercise.   - TSH  4. Need for Tdap vaccination - Tdap vaccine greater than or equal to 7yo IM  5. Screening for diabetes mellitus - Hemoglobin  A1c   RTC: 6 months for asthma  Donia Pounds  MSN, FNP-C [facility] [address] [place], [place] [address] [phone]

## 2017-08-16 LAB — TSH: TSH: 2.15 u[IU]/mL (ref 0.450–4.500)

## 2017-09-03 ENCOUNTER — Emergency Department (HOSPITAL_COMMUNITY)
Admission: EM | Admit: 2017-09-03 | Discharge: 2017-09-03 | Disposition: A | Discharge status: Home / Self Care | Payer: Self-pay | Attending: Emergency Medicine | Admitting: Emergency Medicine

## 2017-09-03 ENCOUNTER — Encounter (HOSPITAL_COMMUNITY): Payer: Self-pay

## 2017-09-03 ENCOUNTER — Emergency Department (HOSPITAL_COMMUNITY): Payer: Self-pay

## 2017-09-03 ENCOUNTER — Other Ambulatory Visit: Payer: Self-pay

## 2017-09-03 DIAGNOSIS — Z79899 Other long term (current) drug therapy: Secondary | ICD-10-CM | POA: Insufficient documentation

## 2017-09-03 DIAGNOSIS — J45909 Unspecified asthma, uncomplicated: Secondary | ICD-10-CM | POA: Insufficient documentation

## 2017-09-03 LAB — CBC WITH DIFFERENTIAL/PLATELET
BASOS PCT: 0 %
Basophils Absolute: 0 10*3/uL (ref 0.0–0.1)
Eosinophils Absolute: 1 10*3/uL — ABNORMAL HIGH (ref 0.0–0.7)
Eosinophils Relative: 9 %
HCT: 38 % (ref 36.0–46.0)
HEMOGLOBIN: 12.1 g/dL (ref 12.0–15.0)
LYMPHS ABS: 1.6 10*3/uL (ref 0.7–4.0)
Lymphocytes Relative: 15 %
MCH: 27.9 pg (ref 26.0–34.0)
MCHC: 31.8 g/dL (ref 30.0–36.0)
MCV: 87.8 fL (ref 78.0–100.0)
MONOS PCT: 3 %
Monocytes Absolute: 0.3 10*3/uL (ref 0.1–1.0)
NEUTROS ABS: 7.9 10*3/uL — AB (ref 1.7–7.7)
NEUTROS PCT: 73 %
Platelets: 368 10*3/uL (ref 150–400)
RBC: 4.33 MIL/uL (ref 3.87–5.11)
RDW: 13.2 % (ref 11.5–15.5)
WBC: 10.9 10*3/uL — ABNORMAL HIGH (ref 4.0–10.5)

## 2017-09-03 LAB — BASIC METABOLIC PANEL
Anion gap: 6 (ref 5–15)
BUN: 6 mg/dL (ref 6–20)
CHLORIDE: 102 mmol/L (ref 101–111)
CO2: 29 mmol/L (ref 22–32)
CREATININE: 0.71 mg/dL (ref 0.44–1.00)
Calcium: 8.7 mg/dL — ABNORMAL LOW (ref 8.9–10.3)
GFR calc non Af Amer: >60 mL/min (ref >60)
Glucose, Bld: 123 mg/dL — ABNORMAL HIGH (ref 65–99)
POTASSIUM: 3.6 mmol/L (ref 3.5–5.1)
Sodium: 137 mmol/L (ref 135–145)

## 2017-09-03 MED ORDER — IPRATROPIUM-ALBUTEROL 0.5-2.5 (3) MG/3ML IN SOLN
3.0000 mL | Freq: Once | RESPIRATORY_TRACT | Status: AC
  Administered 2017-09-03: 3 mL via RESPIRATORY_TRACT
  Filled 2017-09-03: qty 3

## 2017-09-03 MED ORDER — ALBUTEROL SULFATE (2.5 MG/3ML) 0.083% IN NEBU: 2.5000 mg | INHALATION_SOLUTION | Freq: Four times a day (QID) | RESPIRATORY_TRACT | 12 refills | Status: DC | PRN

## 2017-09-03 MED ORDER — PREDNISONE 10 MG PO TABS: 40.0000 mg | ORAL_TABLET | Freq: Every day | ORAL | 0 refills | Status: DC

## 2017-09-03 NOTE — ED Notes (Signed)
Bed: QN99 Expected date:  Expected time:  Means of arrival:  Comments: EMS-asthma

## 2017-09-03 NOTE — ED Triage Notes (Signed)
Pt arrived via EMS from work place. Pt reports to EMS that she has been having  SOB for the past few days an has exacerbated today. Pt has Hx of asthma.    Enroute pt was given  20mg  of Albuterol, 1 mg Atrovent , 125mg  Solumedrol, and 2 mg Mg sulfate

## 2017-09-03 NOTE — ED Provider Notes (Addendum)
[facility] DEPT Provider Note   CSN: [id number] Arrival date & time: 09/03/17  1442     History   Chief Complaint Chief Complaint  Patient presents with  . Shortness of Breath    HPI Sherri Shaw is a 37 y.o. female.  37 year old female with prior history of schizoaffective disorder and asthma presents with complaint of shortness of breath.  Patient reports that her asthma has begun to "act up" over the last 2-3 days.  She does not currently take prednisone.  She reports that her last admission for an asthma exacerbation was at the end of December 2018.  She denies any prior admissions to the ICU or prior intubations.  She reports increasing cough and wheezing over the last 2-3 days.  She denies fever.  She reports her cough is mildly productive.  EMS transported her to the ED today.  She was already given several breathing treatments, 125mg  of solu-Medrol, and 2 grams of magnesium.  She reports that she feels improved at this time.  She is able to speak in full sentences without significant distress.   The history is provided by the patient.  Wheezing   This is a recurrent problem. The current episode started more than 2 days ago. The problem occurs every several days. The problem has not changed since onset.Associated symptoms include cough. Pertinent negatives include no chest pain, no fever, no vomiting and no rash. It is unknown what precipitated the problem. She has tried ipratropium inhalers for the symptoms. The treatment provided mild relief. She has had prior hospitalizations. She has had prior ED visits. She has had no prior ICU admissions. Her past medical history is significant for asthma.    Past Medical History:  Diagnosis Date  . Anemia   . Asthma   . Bipolar disorder [facility])    sister denies this hx on 04/07/2013  . Chronic bronchitis ([place])    "I get it q yr" (04/07/2013)  . Exertional shortness of breath    ? due to anemia  . History  of blood transfusion 04/07/2013   "got my first transfusion today; got 4 units" (04/07/2013)  . Microcytic anemia    Archie Endo 04/07/2013  . Schizophrenia ([place])    "don't really know the type"/sister (04/07/2013)    Patient Active Problem List   Diagnosis Date Noted  . Sepsis ([place]) 08/01/2017  . Hypokalemia 08/01/2017  . Acute respiratory failure with hypoxia ([place]) 08/01/2017  . Anemia   . Schizophrenia ([place])   . Asthma exacerbation 09/03/2014  . Schizo-affective schizophrenia ([place]) 04/10/2013  . Microcytic anemia 04/07/2013  . Uterine mass 04/07/2013  . Trichomonas 04/07/2013  . Bipolar disorder ([place]) 04/07/2013  . Bilateral lower extremity edema 04/07/2013    Past Surgical History:  Procedure Laterality Date  . ABDOMINAL HYSTERECTOMY N/A 06/01/2013   Procedure: HYSTERECTOMY ABDOMINAL;  Surgeon: Lavonia Drafts, MD;  Location: [facility];  Service: Gynecology;  Laterality: N/A;  . BILATERAL SALPINGECTOMY Bilateral 06/01/2013   Procedure: BILATERAL SALPINGECTOMY;  Surgeon: Lavonia Drafts, MD;  Location: [facility];  Service: Gynecology;  Laterality: Bilateral;  . CYSTOSCOPY W/ URETERAL STENT PLACEMENT Bilateral 06/01/2013   Procedure: CYSTOSCOPY WITH BILATERAL STENT REPLACEMENT;  Surgeon: Irine Seal, MD;  Location: [facility];  Service: Urology;  Laterality: Bilateral;  . HYSTERECTOMY ABDOMINAL WITH SALPINGECTOMY    . NO PAST SURGERIES      OB History    Gravida Para Term Preterm AB Living   0 0 0 0 0 0  SAB TAB Ectopic Multiple Live Births   0 0 0 0         Home Medications    Prior to Admission medications   Medication Sig Start Date End Date Taking? Authorizing Provider  albuterol (PROVENTIL HFA;VENTOLIN HFA) 108 (90 Base) MCG/ACT inhaler Inhale 2 puffs into the lungs every 4 (four) hours as needed for wheezing or shortness of breath. 08/15/17   Dorena Dew, FNP  dextromethorphan-guaiFENesin [facility] DM) 30-600 MG 12hr tablet Take 1 tablet by mouth 2 (two) times  daily as needed for cough. 08/02/17   Raiford Noble Latif, DO  ergocalciferol (VITAMIN D2) 50000 units capsule Take 1 capsule (50,000 Units total) by mouth once a week. 10/14/15   Argentina Donovan, PA-C  fluticasone furoate-vilanterol (BREO ELLIPTA) 100-25 MCG/INH AEPB Inhale 1 puff into the lungs daily. 08/15/17   Dorena Dew, FNP  ipratropium-albuterol (DUONEB) 0.5-2.5 (3) MG/3ML SOLN Take 3 mLs by nebulization every 6 (six) hours as needed. 08/02/17   Sheikh, Omair Latif, DO  montelukast (SINGULAIR) 10 MG tablet Take 1 tablet (10 mg total) by mouth at bedtime. 08/15/17   Dorena Dew, FNP  predniSONE (STERAPRED UNI-PAK 21 TAB) 10 MG (21) TBPK tablet Take 6 Tablets Day 1, 5 Tablets Day 2, 4 Tables Day 3, 3 Tablets Day 4, 2 Tablets Day 5, 1 Tablet Day 6, and Stop Day 7 08/02/17   Kerney Elbe, DO    Family History Family History  Problem Relation Age of Onset  . Hypertension Mother   . Diabetes Mother   . Asthma Mother   . Stroke Mother   . Cancer Mother   . Asthma Brother   . Heart disease Maternal Aunt     Social History Social History   Tobacco Use  . Smoking status: Never Smoker  . Smokeless tobacco: Never Used  Substance Use Topics  . Alcohol use: No  . Drug use: No     Allergies   Patient has no known allergies.   Review of Systems Review of Systems  Constitutional: Negative for fever.  Respiratory: Positive for cough and wheezing.   Cardiovascular: Negative for chest pain.  Gastrointestinal: Negative for vomiting.  Skin: Negative for rash.  All other systems reviewed and are negative.    Physical Exam Updated Vital Signs BP 124/88   Pulse 83   Temp 98.4 F (36.9 C) (Oral)   Resp 19   Ht 4\' 11"  (1.499 m)   Wt 79.8 kg (176 lb)   LMP 05/25/2013   SpO2 91%   BMI 35.55 kg/m   Physical Exam  Constitutional: She is oriented to person, place, and time. She appears well-developed and well-nourished. No distress.  HENT:  Head:  Normocephalic and atraumatic.  Mouth/Throat: Oropharynx is clear and moist.  Eyes: Conjunctivae and EOM are normal. Pupils are equal, round, and reactive to light.  Neck: Normal range of motion. Neck supple.  Cardiovascular: Normal rate, regular rhythm and normal heart sounds.  Pulmonary/Chest: Effort normal. No respiratory distress.  Mild diffuse expiratory wheezes present in all lung fields.  Abdominal: Soft. She exhibits no distension. There is no tenderness.  Musculoskeletal: Normal range of motion. She exhibits no edema or deformity.  Neurological: She is alert and oriented to person, place, and time.  Skin: Skin is warm and dry.  Psychiatric: She has a normal mood and affect.  Nursing note and vitals reviewed.    ED Treatments / Results  Labs (all labs ordered are listed,  but only abnormal results are displayed) Labs Reviewed  CBC WITH DIFFERENTIAL/PLATELET - Abnormal; Notable for the following components:      Result Value   WBC 10.9 (*)    Neutro Abs 7.9 (*)    Eosinophils Absolute 1.0 (*)    All other components within normal limits  BASIC METABOLIC PANEL - Abnormal; Notable for the following components:   Glucose, Bld 123 (*)    Calcium 8.7 (*)    All other components within normal limits    EKG  EKG Interpretation None       Radiology Dg Chest Port 1 View  Result Date: 09/03/2017 CLINICAL DATA:  Acute exacerbation of the patient's chronic asthma associated with cough and shortness of breath. EXAM: PORTABLE CHEST 1 VIEW COMPARISON:  07/31/2017, 06/16/2017 and earlier. FINDINGS: Cardiac silhouette and mediastinal contours normal in appearance for the AP portable technique. Pulmonary parenchyma clear. Bronchovascular markings normal. Pulmonary vascularity normal. No pneumothorax. No visible pleural effusions. IMPRESSION: No acute cardiopulmonary disease. Electronically Signed   By: Evangeline Dakin M.D.   On: 09/03/2017 15:23    Procedures Procedures (including  critical care time)  Medications Ordered in ED Medications  ipratropium-albuterol (DUONEB) 0.5-2.5 (3) MG/3ML nebulizer solution 3 mL (not administered)     Initial Impression / Assessment and Plan / ED Course  I have reviewed the triage vital signs and the nursing notes.  Pertinent labs & imaging results that were available during my care of the patient were reviewed by me and considered in my medical decision making (see chart for details).     MDM  Screen complete  Presentation is consistent with the patient's typical asthma.  She feels significantly improved upon arrival after EMS treatment.  Will screen for more significant pathology and continue treatment and re-evaluate.  1745 Patient feels improved and desires DC home - Wheezes are improved on exam - will administer one additional duo neb treatment and if patient shows continued improvement I will likely DC home   1930 Patient feels improved.  Repeat exam reveals trace expiratory wheezes but no significant respiratory effort.  Patient understands the need for close follow-up.  Patient offered further observation or even overnight admission, however, she declines and would prefer to be discharged.  Labs and chest x-ray do not reveal more significant acute pathology.  Initial treatment for her asthma exacerbation seems to improve her status.  She is stable and comfortable at time of discharge home.  Final Clinical Impressions(s) / ED Diagnoses   Final diagnoses:  Moderate asthma, unspecified whether complicated, unspecified whether persistent    ED Discharge Orders        Ordered    albuterol (PROVENTIL) (2.5 MG/3ML) 0.083% nebulizer solution  Every 6 hours PRN     09/03/17 1930    predniSONE (DELTASONE) 10 MG tablet  Daily     09/03/17 1930       Valarie Merino, MD 09/03/17 1931    Valarie Merino, MD 09/03/17 1932

## 2017-09-05 MED FILL — ?MONTELUKAST SOD 10 MG TAB: 10 | 30 days supply | Qty: 30 | Fill #0

## 2017-09-05 MED FILL — !VENTOLIN HFA INHALER: 108 (90 BAS | 16 days supply | Qty: 18 | Fill #0

## 2017-09-05 MED FILL — **BREO ELLIPTA 100-25 MCG I: 100-25MCG | 28 days supply | Qty: 56 | Fill #0

## 2017-09-16 ENCOUNTER — Ambulatory Visit: Payer: Self-pay | Attending: Family Medicine

## 2017-09-16 ENCOUNTER — Other Ambulatory Visit (INDEPENDENT_AMBULATORY_CARE_PROVIDER_SITE_OTHER): Payer: Self-pay

## 2017-09-16 DIAGNOSIS — D649 Anemia, unspecified: Secondary | ICD-10-CM

## 2017-09-17 LAB — CBC
HEMOGLOBIN: 12 g/dL (ref 11.1–15.9)
Hematocrit: 38.7 % (ref 34.0–46.6)
MCH: 28.6 pg (ref 26.6–33.0)
MCHC: 31 g/dL — ABNORMAL LOW (ref 31.5–35.7)
MCV: 92 fL (ref 79–97)
Platelets: 288 10*3/uL (ref 150–379)
RBC: 4.2 x10E6/uL (ref 3.77–5.28)
RDW: 13.6 % (ref 12.3–15.4)
WBC: 6.2 10*3/uL (ref 3.4–10.8)

## 2017-10-10 MED FILL — !VENTOLIN HFA INHALER: 108 (90 BAS | 16 days supply | Qty: 18 | Fill #1

## 2017-10-10 MED FILL — **BREO ELLIPTA 100-25 MCG I: 100-25MCG | 14 days supply | Qty: 28 | Fill #1

## 2017-11-13 ENCOUNTER — Ambulatory Visit: Payer: Self-pay | Admitting: Family Medicine

## 2017-11-22 ENCOUNTER — Ambulatory Visit: Payer: Self-pay | Admitting: Family Medicine

## 2017-12-02 ENCOUNTER — Encounter: Payer: Self-pay | Admitting: Family Medicine

## 2017-12-02 ENCOUNTER — Ambulatory Visit (INDEPENDENT_AMBULATORY_CARE_PROVIDER_SITE_OTHER): Payer: Self-pay | Admitting: Family Medicine

## 2017-12-02 VITALS — BP 104/68 | HR 64 | Temp 97.6°F | Resp 16 | Ht 59.0 in | Wt 163.0 lb

## 2017-12-02 DIAGNOSIS — Z01419 Encounter for gynecological examination (general) (routine) without abnormal findings: Secondary | ICD-10-CM

## 2017-12-02 LAB — POCT URINALYSIS DIP (MANUAL ENTRY)
BILIRUBIN UA: NEGATIVE
GLUCOSE UA: NEGATIVE mg/dL
Ketones, POC UA: NEGATIVE mg/dL
Leukocytes, UA: NEGATIVE
Nitrite, UA: NEGATIVE
Protein Ur, POC: NEGATIVE mg/dL
RBC UA: NEGATIVE
SPEC GRAV UA: 1.015 (ref 1.010–1.025)
Urobilinogen, UA: >=8 E.U./dL — AB
pH, UA: 8 (ref 5.0–8.0)

## 2017-12-02 NOTE — Progress Notes (Signed)
Subjective:     Sherri Shaw is a 37 y.o. female with a history of asthma and schizophrenia presents for a routine gynecological exam.  Patient states that she has not had a Pap smear over the past 5 years.  Patient has never been sexually active.  She denies vaginal discharge, vaginal itching, urinary frequency, urgency, or dysuria.  Patient does not perform monthly self breast exams.  Sherri Shaw states that she is up-to-date with vaccinations.  She does not have balanced diet or exercises routinely. Body mass index is 32.92 kg/m.  Past Medical History:  Diagnosis Date  . Anemia   . Asthma   . Bipolar disorder [facility])    sister denies this hx on 04/07/2013  . Chronic bronchitis ([place])    "I get it q yr" (04/07/2013)  . Exertional shortness of breath    ? due to anemia  . History of blood transfusion 04/07/2013   "got my first transfusion today; got 4 units" (04/07/2013)  . Microcytic anemia    Sherri Shaw 04/07/2013  . Schizophrenia ([place])    "don't really know the type"/sister (04/07/2013)    Gynecologic History Patient's last menstrual period was 05/25/2013.   Obstetric History OB History  Gravida Para Term Preterm AB Living  0 0 0 0 0 0  SAB TAB Ectopic Multiple Live Births  0 0 0 0    Review of Systems  Constitutional: Negative.   HENT: Negative.   Eyes: Negative.   Respiratory: Negative.   Gastrointestinal: Negative.   Genitourinary: Negative.   Musculoskeletal: Negative.   Skin: Negative.   Neurological: Negative.   Psychiatric/Behavioral: Negative.      Objective:  Physical Exam  Constitutional: She is well-developed, well-nourished, and in no distress.  Cardiovascular: Normal rate, regular rhythm and normal heart sounds.  Pulmonary/Chest: Effort normal and breath sounds normal. Right breast exhibits no inverted nipple, no mass, no nipple discharge and no skin change. Left breast exhibits no inverted nipple, no mass, no nipple discharge and no skin change.  Abdominal: Soft. Bowel  sounds are normal.  Genitourinary: Uterus normal, cervix normal, right adnexa normal and left adnexa normal. Vaginal discharge found.  Skin: Skin is warm and dry.  Psychiatric: Mood, memory, affect and judgment normal.    Assessment:    Healthy female exam.    Plan:  1. Pap smear, as part of routine gynecological examination Recommend monthly self-breast examinations Will followup by phone with any abnormal laboratory results Recommend a lowfat, low carbohydrate diet divided over 5-6 small meals, increase water intake to 6-8 glasses, and 150 minutes per week of cardiovascular exercise.   - POCT urinalysis dipstick - IGP,CtNgTv,Apt HPV,rfx16/18,45  Education reviewed: depression evaluation, low fat, low cholesterol diet, safe sex/STD prevention, self breast exams, skin cancer screening and weight bearing exercise. Contraception: abstinence.    RTC:  1 year for annual physical exam   Donia Pounds  MSN, FNP-C [facility] [address] [place], [place] [address] [phone]

## 2017-12-05 ENCOUNTER — Telehealth: Payer: Self-pay

## 2017-12-05 LAB — IGP,CTNGTV,APT HPV,RFX16/18,45
CHLAMYDIA, NUC. ACID AMP: NEGATIVE
Gonococcus, Nuc. Acid Amp: NEGATIVE
HPV APTIMA: NEGATIVE
PAP Smear Comment: 0
Trich vag by NAA: NEGATIVE

## 2017-12-05 NOTE — Telephone Encounter (Signed)
-----   Message from Dorena Dew,  sent at 12/05/2017  1:26 PM EDT ----- Regarding: lab results Please inform patient that Pap smear is negative, will repeat in 3 years per current guidelines.  Donia Pounds  MSN, FNP-C [facility] [address] [place], [place] [address] [phone]

## 2017-12-05 NOTE — Telephone Encounter (Signed)
Called and left a message that recent testing was normal and if she had any questions to call back to our office. Thanks!

## 2017-12-07 NOTE — Patient Instructions (Signed)

## 2018-05-08 ENCOUNTER — Emergency Department (HOSPITAL_COMMUNITY): Payer: Self-pay

## 2018-05-08 ENCOUNTER — Inpatient Hospital Stay (HOSPITAL_COMMUNITY)
Admission: EM | Admit: 2018-05-08 | Discharge: 2018-05-10 | DRG: 202 | Disposition: A | Discharge status: Home / Self Care | Payer: Self-pay | Attending: Internal Medicine | Admitting: Internal Medicine

## 2018-05-08 ENCOUNTER — Encounter (HOSPITAL_COMMUNITY): Payer: Self-pay | Admitting: Emergency Medicine

## 2018-05-08 ENCOUNTER — Other Ambulatory Visit: Payer: Self-pay

## 2018-05-08 DIAGNOSIS — J4541 Moderate persistent asthma with (acute) exacerbation: Principal | ICD-10-CM | POA: Diagnosis present

## 2018-05-08 DIAGNOSIS — F209 Schizophrenia, unspecified: Secondary | ICD-10-CM | POA: Diagnosis present

## 2018-05-08 DIAGNOSIS — Z7951 Long term (current) use of inhaled steroids: Secondary | ICD-10-CM

## 2018-05-08 DIAGNOSIS — D72829 Elevated white blood cell count, unspecified: Secondary | ICD-10-CM | POA: Diagnosis present

## 2018-05-08 DIAGNOSIS — R0902 Hypoxemia: Secondary | ICD-10-CM

## 2018-05-08 DIAGNOSIS — J45901 Unspecified asthma with (acute) exacerbation: Secondary | ICD-10-CM | POA: Diagnosis present

## 2018-05-08 DIAGNOSIS — Z23 Encounter for immunization: Secondary | ICD-10-CM

## 2018-05-08 DIAGNOSIS — J9601 Acute respiratory failure with hypoxia: Secondary | ICD-10-CM | POA: Diagnosis present

## 2018-05-08 LAB — I-STAT BETA HCG BLOOD, ED (MC, WL, AP ONLY)

## 2018-05-08 LAB — CBC WITH DIFFERENTIAL/PLATELET
Basophils Absolute: 0 10*3/uL (ref 0.0–0.1)
Basophils Relative: 0 %
EOS ABS: 1.1 10*3/uL — AB (ref 0.0–0.7)
EOS PCT: 9 %
HCT: 38.9 % (ref 36.0–46.0)
Hemoglobin: 12.5 g/dL (ref 12.0–15.0)
LYMPHS ABS: 2.1 10*3/uL (ref 0.7–4.0)
Lymphocytes Relative: 18 %
MCH: 28.6 pg (ref 26.0–34.0)
MCHC: 32.1 g/dL (ref 30.0–36.0)
MCV: 89 fL (ref 78.0–100.0)
MONOS PCT: 7 %
Monocytes Absolute: 0.9 10*3/uL (ref 0.1–1.0)
Neutro Abs: 8 10*3/uL — ABNORMAL HIGH (ref 1.7–7.7)
Neutrophils Relative %: 66 %
Platelets: 270 10*3/uL (ref 150–400)
RBC: 4.37 MIL/uL (ref 3.87–5.11)
RDW: 13.1 % (ref 11.5–15.5)
WBC: 12.1 10*3/uL — AB (ref 4.0–10.5)

## 2018-05-08 LAB — BASIC METABOLIC PANEL
Anion gap: 9 (ref 5–15)
BUN: 9 mg/dL (ref 6–20)
CHLORIDE: 106 mmol/L (ref 98–111)
CO2: 28 mmol/L (ref 22–32)
CREATININE: 0.81 mg/dL (ref 0.44–1.00)
Calcium: 9.2 mg/dL (ref 8.9–10.3)
GFR calc Af Amer: >60 mL/min (ref >60)
GFR calc non Af Amer: >60 mL/min (ref >60)
GLUCOSE: 105 mg/dL — AB (ref 70–99)
Potassium: 3.8 mmol/L (ref 3.5–5.1)
SODIUM: 143 mmol/L (ref 135–145)

## 2018-05-08 MED ORDER — ALBUTEROL (5 MG/ML) CONTINUOUS INHALATION SOLN
10.0000 mg/h | INHALATION_SOLUTION | Freq: Once | RESPIRATORY_TRACT | Status: AC
  Administered 2018-05-08: 10 mg/h via RESPIRATORY_TRACT
  Filled 2018-05-08: qty 20

## 2018-05-08 MED ORDER — ONDANSETRON HCL 4 MG/2ML IJ SOLN: 4.0000 mg | Freq: Four times a day (QID) | INTRAMUSCULAR | Status: DC | PRN

## 2018-05-08 MED ORDER — MAGNESIUM SULFATE 2 GM/50ML IV SOLN
2.0000 g | Freq: Once | INTRAVENOUS | Status: AC
  Administered 2018-05-08: 2 g via INTRAVENOUS
  Filled 2018-05-08: qty 50

## 2018-05-08 MED ORDER — ACETAMINOPHEN 650 MG RE SUPP: 650.0000 mg | Freq: Four times a day (QID) | RECTAL | Status: DC | PRN

## 2018-05-08 MED ORDER — ONDANSETRON HCL 4 MG PO TABS: 4.0000 mg | ORAL_TABLET | Freq: Four times a day (QID) | ORAL | Status: DC | PRN

## 2018-05-08 MED ORDER — ALBUTEROL SULFATE (2.5 MG/3ML) 0.083% IN NEBU: 2.5000 mg | INHALATION_SOLUTION | RESPIRATORY_TRACT | Status: DC | PRN

## 2018-05-08 MED ORDER — METHYLPREDNISOLONE SODIUM SUCC 125 MG IJ SOLR
125.0000 mg | Freq: Once | INTRAMUSCULAR | Status: AC
  Administered 2018-05-08: 125 mg via INTRAVENOUS
  Filled 2018-05-08: qty 2

## 2018-05-08 MED ORDER — ALBUTEROL SULFATE (2.5 MG/3ML) 0.083% IN NEBU
2.5000 mg | INHALATION_SOLUTION | RESPIRATORY_TRACT | Status: DC
  Administered 2018-05-08 – 2018-05-09 (×2): 2.5 mg via RESPIRATORY_TRACT
  Filled 2018-05-08 (×2): qty 3

## 2018-05-08 MED ORDER — INFLUENZA VAC SPLIT QUAD 0.5 ML IM SUSY
0.5000 mL | PREFILLED_SYRINGE | INTRAMUSCULAR | Status: AC
  Administered 2018-05-09: 0.5 mL via INTRAMUSCULAR
  Filled 2018-05-08: qty 0.5

## 2018-05-08 MED ORDER — METHYLPREDNISOLONE SODIUM SUCC 40 MG IJ SOLR
40.0000 mg | Freq: Two times a day (BID) | INTRAMUSCULAR | Status: DC
  Administered 2018-05-09: 40 mg via INTRAVENOUS
  Filled 2018-05-08: qty 1

## 2018-05-08 MED ORDER — BUDESONIDE 0.25 MG/2ML IN SUSP
0.2500 mg | Freq: Two times a day (BID) | RESPIRATORY_TRACT | Status: DC
  Administered 2018-05-08 – 2018-05-10 (×4): 0.25 mg via RESPIRATORY_TRACT
  Filled 2018-05-08 (×4): qty 2

## 2018-05-08 MED ORDER — ENOXAPARIN SODIUM 40 MG/0.4ML ~~LOC~~ SOLN
40.0000 mg | SUBCUTANEOUS | Status: DC
  Administered 2018-05-09 (×2): 40 mg via SUBCUTANEOUS
  Filled 2018-05-08 (×3): qty 0.4

## 2018-05-08 MED ORDER — ACETAMINOPHEN 325 MG PO TABS: 650.0000 mg | ORAL_TABLET | Freq: Four times a day (QID) | ORAL | Status: DC | PRN

## 2018-05-08 NOTE — ED Provider Notes (Signed)
[facility] DEPT Provider Note   CSN: [id number] Arrival date & time: 05/08/18  1820     History   Chief Complaint Chief Complaint  Patient presents with  . Shortness of Breath    HPI Sherri Shaw is a 37 y.o. female.  She is presenting with an asthma exacerbation.  She said it has been going on for about 4 days.  It started with an upper respiratory cold infection and then she started wheezing.  She has been using nebulizer and her inhaler with minimal relief.  Reportedly her pulse ox was 77% on room air prior to getting placed on oxygen.  She denies any fever chills nausea vomiting diarrhea.  She is had a cough with some yellow sputum.  She said she has not been on steroids a long time.  She does not smoke.  The history is provided by the patient.  Shortness of Breath  This is a recurrent problem. The problem occurs intermittently.The current episode started more than 2 days ago. The problem has been gradually worsening. Associated symptoms include rhinorrhea, sore throat, cough, sputum production and wheezing. Pertinent negatives include no fever, no headaches, no neck pain, no chest pain, no vomiting, no abdominal pain and no rash. The problem's precipitants include weather/humidity. She has tried beta-agonist inhalers for the symptoms. The treatment provided mild relief. Associated medical issues include asthma.    Past Medical History:  Diagnosis Date  . Anemia   . Asthma   . Bipolar disorder [facility])    sister denies this hx on 04/07/2013  . Chronic bronchitis ([place])    "I get it q yr" (04/07/2013)  . Exertional shortness of breath    ? due to anemia  . History of blood transfusion 04/07/2013   "got my first transfusion today; got 4 units" (04/07/2013)  . Microcytic anemia    Archie Endo 04/07/2013  . Schizophrenia ([place])    "don't really know the type"/sister (04/07/2013)    Patient Active Problem List   Diagnosis Date Noted  . Sepsis ([place]) 08/01/2017  .  Hypokalemia 08/01/2017  . Acute respiratory failure with hypoxia ([place]) 08/01/2017  . Anemia   . Schizophrenia ([place])   . Asthma exacerbation 09/03/2014  . Schizo-affective schizophrenia ([place]) 04/10/2013  . Microcytic anemia 04/07/2013  . Uterine mass 04/07/2013  . Trichomonas 04/07/2013  . Bipolar disorder ([place]) 04/07/2013  . Bilateral lower extremity edema 04/07/2013    Past Surgical History:  Procedure Laterality Date  . ABDOMINAL HYSTERECTOMY N/A 06/01/2013   Procedure: HYSTERECTOMY ABDOMINAL;  Surgeon: Lavonia Drafts, MD;  Location: [facility];  Service: Gynecology;  Laterality: N/A;  . BILATERAL SALPINGECTOMY Bilateral 06/01/2013   Procedure: BILATERAL SALPINGECTOMY;  Surgeon: Lavonia Drafts, MD;  Location: [facility];  Service: Gynecology;  Laterality: Bilateral;  . CYSTOSCOPY W/ URETERAL STENT PLACEMENT Bilateral 06/01/2013   Procedure: CYSTOSCOPY WITH BILATERAL STENT REPLACEMENT;  Surgeon: Irine Seal, MD;  Location: [facility];  Service: Urology;  Laterality: Bilateral;  . HYSTERECTOMY ABDOMINAL WITH SALPINGECTOMY    . NO PAST SURGERIES       OB History    Gravida  0   Para  0   Term  0   Preterm  0   AB  0   Living  0     SAB  0   TAB  0   Ectopic  0   Multiple  0   Live Births  Home Medications    Prior to Admission medications   Medication Sig Start Date End Date Taking? Authorizing Provider  albuterol (PROVENTIL HFA;VENTOLIN HFA) 108 (90 Base) MCG/ACT inhaler Inhale 2 puffs into the lungs every 4 (four) hours as needed for wheezing or shortness of breath. 08/15/17   Dorena Dew, FNP  albuterol (PROVENTIL) (2.5 MG/3ML) 0.083% nebulizer solution Take 3 mLs (2.5 mg total) by nebulization every 6 (six) hours as needed for wheezing or shortness of breath. 09/03/17   Valarie Merino, MD  ergocalciferol (VITAMIN D2) 50000 units capsule Take 1 capsule (50,000 Units total) by mouth once a week. Patient not taking: Reported on  09/03/2017 10/14/15   Argentina Donovan, PA-C  fluticasone furoate-vilanterol (BREO ELLIPTA) 100-25 MCG/INH AEPB Inhale 1 puff into the lungs daily. 08/15/17   Dorena Dew, FNP  ipratropium-albuterol (DUONEB) 0.5-2.5 (3) MG/3ML SOLN Take 3 mLs by nebulization every 6 (six) hours as needed. Patient taking differently: Take 3 mLs by nebulization every 6 (six) hours as needed (shortness of breath).  08/02/17   Kerney Elbe, DO    Family History Family History  Problem Relation Age of Onset  . Hypertension Mother   . Diabetes Mother   . Asthma Mother   . Stroke Mother   . Cancer Mother   . Asthma Brother   . Heart disease Maternal Aunt     Social History Social History   Tobacco Use  . Smoking status: Never Smoker  . Smokeless tobacco: Never Used  Substance Use Topics  . Alcohol use: No  . Drug use: No     Allergies   Patient has no known allergies.   Review of Systems Review of Systems  Constitutional: Negative for fever.  HENT: Positive for rhinorrhea and sore throat.   Eyes: Negative for visual disturbance.  Respiratory: Positive for cough, sputum production, shortness of breath and wheezing.   Cardiovascular: Negative for chest pain.  Gastrointestinal: Negative for abdominal pain and vomiting.  Genitourinary: Negative for dysuria.  Musculoskeletal: Negative for neck pain.  Skin: Negative for rash.  Neurological: Negative for headaches.     Physical Exam Updated Vital Signs BP (!) 144/91 (BP Location: Left Arm)   Pulse 92   Temp 98 F (36.7 C)   Resp 18   LMP 05/25/2013   SpO2 100%   Physical Exam  Constitutional: She appears well-developed and well-nourished. No distress.  HENT:  Head: Normocephalic and atraumatic.  Eyes: Conjunctivae are normal.  Neck: Neck supple.  Cardiovascular: Normal rate and regular rhythm.  No murmur heard. Pulmonary/Chest: Accessory muscle usage present. Tachypnea noted. No respiratory distress. She has wheezes.    Abdominal: Soft. There is no tenderness.  Musculoskeletal: She exhibits no edema.       Right lower leg: She exhibits no tenderness and no edema.       Left lower leg: She exhibits no tenderness and no edema.  Neurological: She is alert.  Skin: Skin is warm and dry. Capillary refill takes less than 2 seconds.  Psychiatric: She has a normal mood and affect.  Nursing note and vitals reviewed.    ED Treatments / Results  Labs (all labs ordered are listed, but only abnormal results are displayed) Labs Reviewed  BASIC METABOLIC PANEL - Abnormal; Notable for the following components:      Result Value   Glucose, Bld 105 (*)    All other components within normal limits  CBC WITH DIFFERENTIAL/PLATELET - Abnormal; Notable for the following  components:   WBC 12.1 (*)    Neutro Abs 8.0 (*)    Eosinophils Absolute 1.1 (*)    All other components within normal limits  BASIC METABOLIC PANEL - Abnormal; Notable for the following components:   Glucose, Bld 189 (*)    All other components within normal limits  CBC - Abnormal; Notable for the following components:   WBC 11.0 (*)    All other components within normal limits  I-STAT BETA HCG BLOOD, ED ([facility], [facility], [facility] ONLY)    EKG EKG Interpretation  Date/Time:  Thursday May 08 2018 18:37:27 EDT Ventricular Rate:  94 PR Interval:    QRS Duration: 77 QT Interval:  395 QTC Calculation: 494 R Axis:   44 Text Interpretation:  Sinus rhythm Anterior infarct, old Nonspecific T abnormalities, lateral leads poor baseline similar pattern to prior ecg 12/18 Confirmed by Aletta Edouard [phone]) on 05/08/2018 6:56:14 PM   Radiology Dg Chest 2 View  Result Date: 05/08/2018 CLINICAL DATA:  Shortness of breath. Asthma. EXAM: CHEST - 2 VIEW COMPARISON:  09/03/2017. FINDINGS: Normal sized heart. Clear lungs. Mild peribronchial thickening. Minimal scoliosis. IMPRESSION: Mild bronchitic changes. Electronically Signed   By: Claudie Revering M.D.   On: 05/08/2018  20:30    Procedures .Critical Care Performed by: Hayden Rasmussen, MD Authorized by: Hayden Rasmussen, MD   Critical care provider statement:    Critical care time (minutes):  45   Critical care time was exclusive of:  Separately billable procedures and treating other patients   Critical care was necessary to treat or prevent imminent or life-threatening deterioration of the following conditions:  Respiratory failure   Critical care was time spent personally by me on the following activities:  Discussions with consultants, evaluation of patient's response to treatment, examination of patient, ordering and performing treatments and interventions, ordering and review of laboratory studies, ordering and review of radiographic studies, pulse oximetry, re-evaluation of patient's condition, obtaining history from patient or surrogate, review of old charts and development of treatment plan with patient or surrogate   I assumed direction of critical care for this patient from another provider in my specialty: no     (including critical care time)  Medications Ordered in ED Medications  albuterol (PROVENTIL,VENTOLIN) solution continuous neb (has no administration in time range)  magnesium sulfate IVPB 2 g 50 mL (has no administration in time range)  methylPREDNISolone sodium succinate (SOLU-MEDROL) 125 mg/2 mL injection 125 mg (has no administration in time range)     Initial Impression / Assessment and Plan / ED Course  I have reviewed the triage vital signs and the nursing notes.  Pertinent labs & imaging results that were available during my care of the patient were reviewed by me and considered in my medical decision making (see chart for details).  Clinical Course as of May 09 1558  Thu May 08, 2018  1935 Reevaluated patient.  She states she does not feel much better and on lung exam she still got a moderate amount of wheezing.  She still undergoing continuous neb.   [MB]  2103  Reevaluated patient.  She still has a moderate amount of wheezing and is still requiring supplemental oxygen.  Off of oxygen she is sats between 84 and 87%.  I do not feel she is going to be able to be discharged tonight so we will talk to the hospitalist regarding admission.   [MB]  2108 Discussed with Dr. Cyndia Diver from the hospitalist service who will  evaluate the patient for admission.   [MB]    Clinical Course User Index [MB] Hayden Rasmussen, MD     Final Clinical Impressions(s) / ED Diagnoses   Final diagnoses:  Moderate persistent asthma with exacerbation  Hypoxia    ED Discharge Orders    None       Hayden Rasmussen, MD 05/09/18 1600

## 2018-05-08 NOTE — Progress Notes (Signed)
Pt unable to move needle on peak flow-0

## 2018-05-08 NOTE — ED Triage Notes (Signed)
Pt stated she has been having problem breathing all day. Pt used a nebulizer and 2 puffs of inhaler at home before arrival  Pt o2 77% on room air moved to Res B

## 2018-05-08 NOTE — ED Notes (Signed)
O2 sat 93% when returned from x-ray but has dropped to 87-88% on room air. Placed ib 2L Sherri Shaw.

## 2018-05-08 NOTE — H&P (Signed)
History and Physical    Versie Soave SEG:[id number] DOB: [birth year] DOA: 05/08/2018  PCP: Dorena Dew, FNP  Patient coming from: Home.  Chief Complaint: Shortness of breath.  HPI: Sherri Shaw is a 37 y.o. female with history of asthma and bipolar disorder presents to the ER because of worsening shortness of breath over the last 2 to 3 days.  Has benign productive cough with yellowish sputum.  Has chest pain only on coughing.  Other chest pain-free.  Shortness of breath is present even at rest.  Patient states she gets attacks of asthma during change of season.  Denies any fever or chills.  ED Course: In the ER patient was afebrile but on exam was hypoxic and diffusely wheezing.  Chest x-ray shows bronchitic changes.  Patient was given nebulizer treatment in the ER along with steroid despite which patient was still wheezing and admitted for further observation.  Patient becomes easily short of breath on exertion.  Review of Systems: As per HPI, rest all negative.   Past Medical History:  Diagnosis Date  . Anemia   . Asthma   . Bipolar disorder [facility])    sister denies this hx on 04/07/2013  . Chronic bronchitis ([place])    "I get it q yr" (04/07/2013)  . Exertional shortness of breath    ? due to anemia  . History of blood transfusion 04/07/2013   "got my first transfusion today; got 4 units" (04/07/2013)  . Microcytic anemia    Archie Endo 04/07/2013  . Schizophrenia ([place])    "don't really know the type"/sister (04/07/2013)    Past Surgical History:  Procedure Laterality Date  . ABDOMINAL HYSTERECTOMY N/A 06/01/2013   Procedure: HYSTERECTOMY ABDOMINAL;  Surgeon: Lavonia Drafts, MD;  Location: [facility];  Service: Gynecology;  Laterality: N/A;  . BILATERAL SALPINGECTOMY Bilateral 06/01/2013   Procedure: BILATERAL SALPINGECTOMY;  Surgeon: Lavonia Drafts, MD;  Location: [facility];  Service: Gynecology;  Laterality: Bilateral;  . CYSTOSCOPY W/ URETERAL STENT PLACEMENT Bilateral  06/01/2013   Procedure: CYSTOSCOPY WITH BILATERAL STENT REPLACEMENT;  Surgeon: Irine Seal, MD;  Location: [facility];  Service: Urology;  Laterality: Bilateral;  . HYSTERECTOMY ABDOMINAL WITH SALPINGECTOMY    . NO PAST SURGERIES       reports that she has never smoked. She has never used smokeless tobacco. She reports that she does not drink alcohol or use drugs.  No Known Allergies  Family History  Problem Relation Age of Onset  . Hypertension Mother   . Diabetes Mother   . Asthma Mother   . Stroke Mother   . Cancer Mother   . Asthma Brother   . Heart disease Maternal Aunt     Prior to Admission medications   Medication Sig Start Date End Date Taking? Authorizing Provider  albuterol (PROVENTIL HFA;VENTOLIN HFA) 108 (90 Base) MCG/ACT inhaler Inhale 2 puffs into the lungs every 4 (four) hours as needed for wheezing or shortness of breath. 08/15/17  Yes Dorena Dew, FNP  albuterol (PROVENTIL) (2.5 MG/3ML) 0.083% nebulizer solution Take 3 mLs (2.5 mg total) by nebulization every 6 (six) hours as needed for wheezing or shortness of breath. 09/03/17  Yes Valarie Merino, MD  ergocalciferol (VITAMIN D2) 50000 units capsule Take 1 capsule (50,000 Units total) by mouth once a week. Patient not taking: Reported on 09/03/2017 10/14/15   Argentina Donovan, PA-C  fluticasone furoate-vilanterol (BREO ELLIPTA) 100-25 MCG/INH AEPB Inhale 1 puff into the lungs daily. Patient not taking: Reported on 05/08/2018 08/15/17  Dorena Dew, FNP  ipratropium-albuterol (DUONEB) 0.5-2.5 (3) MG/3ML SOLN Take 3 mLs by nebulization every 6 (six) hours as needed. Patient not taking: Reported on 05/08/2018 08/02/17   Kerney Elbe, DO    Physical Exam: Vitals:   05/08/18 1859 05/08/18 2000 05/08/18 2041 05/08/18 2123  BP:  123/79 117/72 121/69  Pulse:  (!) 102 (!) 107 99  Resp:  (!) 22 (!) 21 18  Temp:      SpO2: 100% 100% (!) 87% 95%      Constitutional: Moderately built and nourished. Vitals:    05/08/18 1859 05/08/18 2000 05/08/18 2041 05/08/18 2123  BP:  123/79 117/72 121/69  Pulse:  (!) 102 (!) 107 99  Resp:  (!) 22 (!) 21 18  Temp:      SpO2: 100% 100% (!) 87% 95%   Eyes: Anicteric no pallor. ENMT: No discharge from the ears eyes nose or mouth. Neck: No mass or.  No neck rigidity.  No JVD appreciated. Respiratory: Bilateral expiratory wheeze and no crepitations. Cardiovascular: S1-S2 heard no murmurs appreciated. Abdomen: Soft nontender bowel sounds present. Musculoskeletal: No edema.  No joint effusion. Skin: No rash.  Skin appears warm. Neurologic: Alert awake oriented to time place and person.  Moves all extremities. Psychiatric: Appears normal.  Normal affect.   Labs on Admission: I have personally reviewed following labs and imaging studies  CBC: Recent Labs  Lab 05/08/18 1920  WBC 12.1*  NEUTROABS 8.0*  HGB 12.5  HCT 38.9  MCV 89.0  PLT 235   Basic Metabolic Panel: Recent Labs  Lab 05/08/18 1920  NA 143  K 3.8  CL 106  CO2 28  GLUCOSE 105*  BUN 9  CREATININE 0.81  CALCIUM 9.2   GFR: CrCl cannot be calculated (Unknown ideal weight.). Liver Function Tests: No results for input(s): AST, ALT, ALKPHOS, BILITOT, PROT, ALBUMIN in the last 168 hours. No results for input(s): LIPASE, AMYLASE in the last 168 hours. No results for input(s): AMMONIA in the last 168 hours. Coagulation Profile: No results for input(s): INR, PROTIME in the last 168 hours. Cardiac Enzymes: No results for input(s): CKTOTAL, CKMB, CKMBINDEX, TROPONINI in the last 168 hours. BNP (last 3 results) No results for input(s): PROBNP in the last 8760 hours. HbA1C: No results for input(s): HGBA1C in the last 72 hours. CBG: No results for input(s): GLUCAP in the last 168 hours. Lipid Profile: No results for input(s): CHOL, HDL, LDLCALC, TRIG, CHOLHDL, LDLDIRECT in the last 72 hours. Thyroid Function Tests: No results for input(s): TSH, T4TOTAL, FREET4, T3FREE, THYROIDAB in  the last 72 hours. Anemia Panel: No results for input(s): VITAMINB12, FOLATE, FERRITIN, TIBC, IRON, RETICCTPCT in the last 72 hours. Urine analysis:    Component Value Date/Time   COLORURINE YELLOW 04/06/2013 2155   APPEARANCEUR CLOUDY (A) 04/06/2013 2155   LABSPEC 1.020 04/06/2013 2155   PHURINE 7.5 04/06/2013 2155   GLUCOSEU NEGATIVE 04/06/2013 2155   HGBUR NEGATIVE 04/06/2013 2155   BILIRUBINUR negative 12/02/2017 1343   KETONESUR negative 12/02/2017 1343   KETONESUR NEGATIVE 04/06/2013 2155   PROTEINUR negative 12/02/2017 1343   PROTEINUR NEGATIVE 04/06/2013 2155   UROBILINOGEN >=8.0 (A) 12/02/2017 1343   UROBILINOGEN 1.0 04/06/2013 2155   NITRITE Negative 12/02/2017 1343   NITRITE NEGATIVE 04/06/2013 2155   LEUKOCYTESUR Negative 12/02/2017 1343   Sepsis Labs: @LABRCNTIP (procalcitonin:4,lacticidven:4) )No results found for this or any previous visit (from the past 240 hour(s)).   Radiological Exams on Admission: Dg Chest 2 View  Result  Date: 05/08/2018 CLINICAL DATA:  Shortness of breath. Asthma. EXAM: CHEST - 2 VIEW COMPARISON:  09/03/2017. FINDINGS: Normal sized heart. Clear lungs. Mild peribronchial thickening. Minimal scoliosis. IMPRESSION: Mild bronchitic changes. Electronically Signed   By: Claudie Revering M.D.   On: 05/08/2018 20:30    EKG: Independently reviewed.  Normal sinus rhythm poor R wave progression.  Assessment/Plan Principal Problem:   Acute respiratory failure with hypoxia (HCC) Active Problems:   Asthma exacerbation   Schizophrenia ([place])    1. Acute respiratory failure with hypoxia secondary to asthma exacerbation -exam patient is hypoxic and is still diffusely wheezing.  Will place patient on Solu-Medrol 40 mg IV every 12 and on Pulmicort nebulizer treatment.  Closely observe. 2. History of bipolar disorder schizophrenia per chart presently not on medications. 3. Leukocytosis likely reactionary.  No fever.   DVT prophylaxis: Lovenox. Code Status:  Full code. Family Communication: Discussed with patient. Disposition Plan: Home. Consults called: None. Admission status: Observation.   Rise Patience MD [facility] Pager [phone].  If 7PM-7AM, please contact night-coverage [email or web address] Password [email or web address]  05/08/2018, 9:59 PM

## 2018-05-08 NOTE — ED Notes (Signed)
ED TO INPATIENT HANDOFF REPORT  Name/Age/Gender Sherri Shaw 37 y.o. female  Code Status    Code Status Orders  (From admission, onward)         Start     Ordered   05/08/18 2158  Full code  Continuous     05/08/18 2158        Code Status History    Date Active Date Inactive Code Status Order ID Comments User Context   08/01/2017 0310 08/02/2017 1940 Full Code [id number]  Ivor Costa, MD ED   10/07/2015 0414 10/07/2015 2055 Full Code [id number]  Quintella Baton, MD Inpatient   09/03/2014 2015 09/06/2014 2018 Full Code [id number]  Bethena Roys, MD Inpatient   06/01/2013 1429 06/03/2013 2251 Full Code [id number]  Lavonia Drafts, MD Inpatient   04/07/2013 0348 04/08/2013 1630 Full Code [id number]  Othella Boyer, MD Inpatient      Home/SNF/Other Home  Chief Complaint difficlulty breathing, asthma  Level of Care/Admitting Diagnosis ED Disposition    ED Disposition Condition Comment   [facility] [[id number]]  Level of Care: Telemetry [5]  Admit to tele based on following criteria: Monitor for Ischemic changes  Diagnosis: Acute respiratory failure with hypoxia [facility]) [[id number]]  Admitting Physician: Rise Patience [phone]  Attending Physician: Rise Patience [email or web address]  PT Class (Do Not Modify): Observation [104]  PT Acc Code (Do Not Modify): Observation [10022]       Medical History Past Medical History:  Diagnosis Date  . Anemia   . Asthma   . Bipolar disorder [facility])    sister denies this hx on 04/07/2013  . Chronic bronchitis ([place])    "I get it q yr" (04/07/2013)  . Exertional shortness of breath    ? due to anemia  . History of blood transfusion 04/07/2013   "got my first transfusion today; got 4 units" (04/07/2013)  . Microcytic anemia    Archie Endo 04/07/2013  . Schizophrenia ([place])    "don't really know the type"/sister (04/07/2013)    Allergies No Known Allergies  IV Location/Drains/Wounds Patient Lines/Drains/Airways  Status   Active Line/Drains/Airways    Name:   Placement date:   Placement time:   Site:   Days:   Peripheral IV 05/08/18 Left Antecubital   05/08/18    1852    Antecubital   less than 1          Labs/Imaging Results for orders placed or performed during the hospital encounter of 05/08/18 (from the past 48 hour(s))  I-Stat beta hCG blood, ED     Status: None   Collection Time: 05/08/18  7:05 PM  Result Value Ref Range   I-stat hCG, quantitative <5.0 <5 mIU/mL   Comment 3            Comment:   GEST. AGE      CONC.  (mIU/mL)   <=1 WEEK        5 - 50     2 WEEKS       50 - 500     3 WEEKS       100 - 10,000     4 WEEKS     1,000 - 30,000        FEMALE AND NON-PREGNANT FEMALE:     LESS THAN 5 mIU/mL   Basic metabolic panel     Status: Abnormal   Collection Time: 05/08/18  7:20 PM  Result Value Ref Range   Sodium 143  135 - 145 mmol/L   Potassium 3.8 3.5 - 5.1 mmol/L   Chloride 106 98 - 111 mmol/L   CO2 28 22 - 32 mmol/L   Glucose, Bld 105 (H) 70 - 99 mg/dL   BUN 9 6 - 20 mg/dL   Creatinine, Ser 0.81 0.44 - 1.00 mg/dL   Calcium 9.2 8.9 - 10.3 mg/dL   GFR calc non Af Amer >60 >60 mL/min   GFR calc Af Amer >60 >60 mL/min    Comment: (NOTE) The eGFR has been calculated using the CKD EPI equation. This calculation has not been validated in all clinical situations. eGFR's persistently <60 mL/min signify possible Chronic Kidney Disease.    Anion gap 9 5 - 15    Comment: Performed at [facility], [place] [address] [place], [place] [address]  CBC with Differential/Platelet     Status: Abnormal   Collection Time: 05/08/18  7:20 PM  Result Value Ref Range   WBC 12.1 (H) 4.0 - 10.5 K/uL   RBC 4.37 3.87 - 5.11 MIL/uL   Hemoglobin 12.5 12.0 - 15.0 g/dL   HCT 38.9 36.0 - 46.0 %   MCV 89.0 78.0 - 100.0 fL   MCH 28.6 26.0 - 34.0 pg   MCHC 32.1 30.0 - 36.0 g/dL   RDW 13.1 11.5 - 15.5 %   Platelets 270 150 - 400 K/uL   Neutrophils Relative % 66 %   Neutro Abs 8.0  (H) 1.7 - 7.7 K/uL   Lymphocytes Relative 18 %   Lymphs Abs 2.1 0.7 - 4.0 K/uL   Monocytes Relative 7 %   Monocytes Absolute 0.9 0.1 - 1.0 K/uL   Eosinophils Relative 9 %   Eosinophils Absolute 1.1 (H) 0.0 - 0.7 K/uL   Basophils Relative 0 %   Basophils Absolute 0.0 0.0 - 0.1 K/uL    Comment: Performed at [facility], [place] [address], [place], [place] [address]   Dg Chest 2 View  Result Date: 05/08/2018 CLINICAL DATA:  Shortness of breath. Asthma. EXAM: CHEST - 2 VIEW COMPARISON:  09/03/2017. FINDINGS: Normal sized heart. Clear lungs. Mild peribronchial thickening. Minimal scoliosis. IMPRESSION: Mild bronchitic changes. Electronically Signed   By: Claudie Revering M.D.   On: 05/08/2018 20:30    Pending Labs Unresulted Labs (From admission, onward)    Start     Ordered   05/15/18 0500  Creatinine, serum  (enoxaparin (LOVENOX)    CrCl >/= 30 ml/min)  Weekly,   R    Comments:  while on enoxaparin therapy    05/08/18 2158   05/09/18 4142  Basic metabolic panel  Tomorrow morning,   R     05/08/18 2158   05/09/18 0500  CBC  Tomorrow morning,   R     05/08/18 2158   05/08/18 2157  CBC  (enoxaparin (LOVENOX)    CrCl >/= 30 ml/min)  Once,   R    Comments:  Baseline for enoxaparin therapy IF NOT ALREADY DRAWN.  Notify MD if PLT < 100 K.    05/08/18 2158   05/08/18 2157  Creatinine, serum  (enoxaparin (LOVENOX)    CrCl >/= 30 ml/min)  Once,   R    Comments:  Baseline for enoxaparin therapy IF NOT ALREADY DRAWN.    05/08/18 2158          Vitals/Pain Today's Vitals   05/08/18 1859 05/08/18 2000 05/08/18 2041 05/08/18 2123  BP:  123/79 117/72 121/69  Pulse:  Marland Kitchen)  102 (!) 107 99  Resp:  (!) 22 (!) 21 18  Temp:      SpO2: 100% 100% (!) 87% 95%  PainSc:        Isolation Precautions No active isolations  Medications Medications  acetaminophen (TYLENOL) tablet 650 mg (has no administration in time range)    Or  acetaminophen (TYLENOL) suppository 650 mg (has no  administration in time range)  ondansetron (ZOFRAN) tablet 4 mg (has no administration in time range)    Or  ondansetron (ZOFRAN) injection 4 mg (has no administration in time range)  enoxaparin (LOVENOX) injection 40 mg (has no administration in time range)  albuterol (PROVENTIL) (2.5 MG/3ML) 0.083% nebulizer solution 2.5 mg (has no administration in time range)  albuterol (PROVENTIL) (2.5 MG/3ML) 0.083% nebulizer solution 2.5 mg (has no administration in time range)  budesonide (PULMICORT) nebulizer solution 0.25 mg (has no administration in time range)  methylPREDNISolone sodium succinate (SOLU-MEDROL) 40 mg/mL injection 40 mg (has no administration in time range)  albuterol (PROVENTIL,VENTOLIN) solution continuous neb (10 mg/hr Nebulization Given 05/08/18 1859)  magnesium sulfate IVPB 2 g 50 mL (0 g Intravenous Stopped 05/08/18 2027)  methylPREDNISolone sodium succinate (SOLU-MEDROL) 125 mg/2 mL injection 125 mg (125 mg Intravenous Given 05/08/18 1858)    Mobility walks

## 2018-05-09 LAB — BASIC METABOLIC PANEL
ANION GAP: 11 (ref 5–15)
BUN: 8 mg/dL (ref 6–20)
CO2: 26 mmol/L (ref 22–32)
Calcium: 9.5 mg/dL (ref 8.9–10.3)
Chloride: 104 mmol/L (ref 98–111)
Creatinine, Ser: 0.74 mg/dL (ref 0.44–1.00)
GFR calc non Af Amer: >60 mL/min (ref >60)
GLUCOSE: 189 mg/dL — AB (ref 70–99)
Potassium: 4.2 mmol/L (ref 3.5–5.1)
Sodium: 141 mmol/L (ref 135–145)

## 2018-05-09 LAB — CBC
HEMATOCRIT: 38.2 % (ref 36.0–46.0)
HEMOGLOBIN: 12.1 g/dL (ref 12.0–15.0)
MCH: 28 pg (ref 26.0–34.0)
MCHC: 31.7 g/dL (ref 30.0–36.0)
MCV: 88.4 fL (ref 78.0–100.0)
Platelets: 283 10*3/uL (ref 150–400)
RBC: 4.32 MIL/uL (ref 3.87–5.11)
RDW: 13.1 % (ref 11.5–15.5)
WBC: 11 10*3/uL — ABNORMAL HIGH (ref 4.0–10.5)

## 2018-05-09 MED ORDER — ALBUTEROL SULFATE (2.5 MG/3ML) 0.083% IN NEBU
2.5000 mg | INHALATION_SOLUTION | Freq: Four times a day (QID) | RESPIRATORY_TRACT | Status: DC
  Administered 2018-05-09 – 2018-05-10 (×5): 2.5 mg via RESPIRATORY_TRACT
  Filled 2018-05-09 (×5): qty 3

## 2018-05-09 MED ORDER — METHYLPREDNISOLONE SODIUM SUCC 40 MG IJ SOLR
40.0000 mg | Freq: Four times a day (QID) | INTRAMUSCULAR | Status: DC
  Administered 2018-05-09 – 2018-05-10 (×5): 40 mg via INTRAVENOUS
  Filled 2018-05-09 (×5): qty 1

## 2018-05-09 MED ORDER — DM-GUAIFENESIN ER 30-600 MG PO TB12
1.0000 | ORAL_TABLET | Freq: Two times a day (BID) | ORAL | Status: DC
  Administered 2018-05-09 – 2018-05-10 (×3): 1 via ORAL
  Filled 2018-05-09 (×3): qty 1

## 2018-05-09 MED ORDER — MONTELUKAST SODIUM 10 MG PO TABS
10.0000 mg | ORAL_TABLET | Freq: Every day | ORAL | Status: DC
  Administered 2018-05-09: 10 mg via ORAL
  Filled 2018-05-09: qty 1

## 2018-05-09 NOTE — Progress Notes (Signed)
PROGRESS NOTE    Sherri Shaw  UDJ:[id number] DOB: 10-Jun-1981 DOA: 05/08/2018 PCP: Dorena Dew, FNP   Brief Narrative:  Sherri Shaw is a 37 y.o. female with history of asthma and bipolar disorder presents to the ER because of worsening shortness of breath over the last 2 to 3 days.  Has benign productive cough with yellowish sputum.  Has chest pain only on coughing. ABG showed hypoxia. CXR shows bronchitic changes. Treated with iv Mag sulfate.  She's on iv SoluMedrol, bronchodilator, pulmicort, and singulair. She's a non-smoker   Assessment & Plan:   Principal Problem:   Acute respiratory failure with hypoxia ([place]) Active Problems:   Asthma exacerbation   Schizophrenia ([place])  Plan: Acute respiratory failure with hypoxia due to asthma exacerbation -respiratory team eval and treat -increase iv SoluMedrol to 40 qid  -continue pulmicort and bronchodilator -add singulair -add cough meds -peak flow measure before and after bronchodilator treatment -OOB with all meals -encourage ambulation as tolerated -wean down O2 as tolerated to keep O2 sat>=92%   DVT prophylaxis: Lovenox Code Status: full Family Communication: no family at bedside Disposition Plan: home in 2-3 days when asthma controlled   Consultants:   none  Procedures:none  Antimicrobials: none  Subjective: Still sob and wheezing; on 2L [place]; afebrile; some cough with sputum  Objective: Vitals:   05/08/18 2334 05/09/18 0342 05/09/18 0432 05/09/18 0928  BP:   100/69   Pulse:   76 72  Resp:   20 18  Temp:   98.8 F (37.1 C)   TempSrc:   Oral   SpO2: 92% 97% 94% 98%  Weight:      Height:        Intake/Output Summary (Last 24 hours) at 05/09/2018 1126 Last data filed at 05/08/2018 2027 Gross per 24 hour  Intake 50 ml  Output -  Net 50 ml   Filed Weights   05/08/18 2240  Weight: 69.5 kg    Examination:  General exam: in mild respiratory distress  Respiratory system: diffuse wheezing  bilaterally with using accessory muscles Cardiovascular system: S1 & S2 heard, tachycardia. No JVD, murmurs, rubs, gallops or clicks. No pedal edema. Gastrointestinal system: Abdomen is nondistended, soft and nontender. No organomegaly or masses felt. Normal bowel sounds heard. Central nervous system: Alert and oriented. No focal neurological deficits. Extremities: Symmetric 5 x 5 power. Skin: No rashes, lesions or ulcers Psychiatry: Judgement and insight appear normal. Mood & affect appropriate.     Data Reviewed: I have personally reviewed following labs and imaging studies  CBC: Recent Labs  Lab 05/08/18 1920 05/09/18 0536  WBC 12.1* 11.0*  NEUTROABS 8.0*  --   HGB 12.5 12.1  HCT 38.9 38.2  MCV 89.0 88.4  PLT 270 588   Basic Metabolic Panel: Recent Labs  Lab 05/08/18 1920 05/09/18 0536  NA 143 141  K 3.8 4.2  CL 106 104  CO2 28 26  GLUCOSE 105* 189*  BUN 9 8  CREATININE 0.81 0.74  CALCIUM 9.2 9.5   GFR: Estimated Creatinine Clearance: 84.2 mL/min (by C-G formula based on SCr of 0.74 mg/dL). Liver Function Tests: No results for input(s): AST, ALT, ALKPHOS, BILITOT, PROT, ALBUMIN in the last 168 hours. No results for input(s): LIPASE, AMYLASE in the last 168 hours. No results for input(s): AMMONIA in the last 168 hours. Coagulation Profile: No results for input(s): INR, PROTIME in the last 168 hours. Cardiac Enzymes: No results for input(s): CKTOTAL, CKMB, CKMBINDEX, TROPONINI in the last 168  hours. BNP (last 3 results) No results for input(s): PROBNP in the last 8760 hours. HbA1C: No results for input(s): HGBA1C in the last 72 hours. CBG: No results for input(s): GLUCAP in the last 168 hours. Lipid Profile: No results for input(s): CHOL, HDL, LDLCALC, TRIG, CHOLHDL, LDLDIRECT in the last 72 hours. Thyroid Function Tests: No results for input(s): TSH, T4TOTAL, FREET4, T3FREE, THYROIDAB in the last 72 hours. Anemia Panel: No results for input(s): VITAMINB12,  FOLATE, FERRITIN, TIBC, IRON, RETICCTPCT in the last 72 hours. Sepsis Labs: No results for input(s): PROCALCITON, LATICACIDVEN in the last 168 hours.  No results found for this or any previous visit (from the past 240 hour(s)).       Radiology Studies: Dg Chest 2 View  Result Date: 05/08/2018 CLINICAL DATA:  Shortness of breath. Asthma. EXAM: CHEST - 2 VIEW COMPARISON:  09/03/2017. FINDINGS: Normal sized heart. Clear lungs. Mild peribronchial thickening. Minimal scoliosis. IMPRESSION: Mild bronchitic changes. Electronically Signed   By: Claudie Revering M.D.   On: 05/08/2018 20:30        Scheduled Meds: . albuterol  2.5 mg Nebulization Q6H  . budesonide (PULMICORT) nebulizer solution  0.25 mg Nebulization BID  . enoxaparin (LOVENOX) injection  40 mg Subcutaneous Q24H  . methylPREDNISolone (SOLU-MEDROL) injection  40 mg Intravenous Q12H  . montelukast  10 mg Oral QHS   Continuous Infusions:   LOS: 0 days    Time spent: 35 min    Paticia Stack, MD [facility] Pager [phone]  If 7PM-7AM, please contact night-coverage [email or web address] Password [email or web address] 05/09/2018, 11:26 AM

## 2018-05-10 MED ORDER — MONTELUKAST SODIUM 10 MG PO TABS: 10.0000 mg | ORAL_TABLET | Freq: Every day | ORAL | 0 refills | Status: DC

## 2018-05-10 MED ORDER — PREDNISONE 10 MG PO TABS: ORAL_TABLET | ORAL | 0 refills | Status: DC

## 2018-05-10 MED ORDER — PNEUMOCOCCAL VAC POLYVALENT 25 MCG/0.5ML IJ INJ: 0.5000 mL | INJECTION | INTRAMUSCULAR | Status: DC

## 2018-05-10 NOTE — Progress Notes (Signed)
SATURATION QUALIFICATIONS: (This note is used to comply with regulatory documentation for home oxygen)  Patient Saturations on Room Air at Rest = 97%  Patient Saturations on Room Air while Ambulating = 94-98%   Patient Saturations on n/a Liters of oxygen while Ambulating = n/a/  Please briefly explain why patient needs home oxygen: patient does not require oxygen

## 2018-05-10 NOTE — Discharge Summary (Signed)
Physician Discharge Summary  Sherri Shaw QQV:[id number] DOB: [birth year] DOA: 05/08/2018  PCP: Dorena Dew, FNP  Admit date: 05/08/2018 Discharge date: 05/10/2018  Admitted From: Home  disposition: Home  Recommendations for Outpatient Follow-up:  1. Follow up with PCP in 1-2 weeks 2. Please obtain BMP/CBC in one week  Home Health: None Equipment/Devices none  Discharge Condition: Stable CODE STATUS full code Diet recommendation: Cardiac  Brief/Interim Summary:37 y.o.femalewithhistory of asthma and bipolar disorder presents to the ER because of worsening shortness of breath over the last 2 to 3 days. Has benign productive cough with yellowish sputum. Has chest pain only on coughing. ABG showed hypoxia. CXR shows bronchitic changes. Treated with iv Mag sulfate.  She's on iv SoluMedrol, bronchodilator, pulmicort, and singulair. She's a non-smoker    Discharge Diagnoses:  Principal Problem:   Acute respiratory failure with hypoxia ([place]) Active Problems:   Asthma exacerbation   Schizophrenia ([place])  1]Asthma exacerbation-patient was treated with IV steroids and nebulizer treatments .  On the day of discharge patient ambulated without desaturation.  She will be discharged home on tapering dose of prednisone and albuterol inhaler and nebulizer.  Patient to follow-up with her PCP.  Discharge Instructions  Discharge Instructions    Call MD for:  difficulty breathing, headache or visual disturbances   Complete by:  As directed    Call MD for:  temperature >100.4   Complete by:  As directed    Diet - low sodium heart healthy   Complete by:  As directed    Increase activity slowly   Complete by:  As directed      Allergies as of 05/10/2018   No Known Allergies     Medication List    STOP taking these medications   ergocalciferol 50000 units capsule Commonly known as:  VITAMIN D2   fluticasone furoate-vilanterol 100-25 MCG/INH Aepb Commonly known as:  BREO ELLIPTA    ipratropium-albuterol 0.5-2.5 (3) MG/3ML Soln Commonly known as:  DUONEB     TAKE these medications   albuterol 108 (90 Base) MCG/ACT inhaler Commonly known as:  PROVENTIL HFA;VENTOLIN HFA Inhale 2 puffs into the lungs every 4 (four) hours as needed for wheezing or shortness of breath.   albuterol (2.5 MG/3ML) 0.083% nebulizer solution Commonly known as:  PROVENTIL Take 3 mLs (2.5 mg total) by nebulization every 6 (six) hours as needed for wheezing or shortness of breath.   montelukast 10 MG tablet Commonly known as:  SINGULAIR Take 1 tablet (10 mg total) by mouth at bedtime.   predniSONE 10 MG tablet Commonly known as:  DELTASONE Take 3  tablets daily for 4 days then 2  tablets for the following 4 days then 1 tablet daily till done.      Follow-up Information    Dorena Dew, FNP Follow up.   Specialty:  Family Medicine Contact information: [place]. [place] [address] [phone]          No Known Allergies  Consultations:  None   Procedures/Studies: Dg Chest 2 View  Result Date: 05/08/2018 CLINICAL DATA:  Shortness of breath. Asthma. EXAM: CHEST - 2 VIEW COMPARISON:  09/03/2017. FINDINGS: Normal sized heart. Clear lungs. Mild peribronchial thickening. Minimal scoliosis. IMPRESSION: Mild bronchitic changes. Electronically Signed   By: Claudie Revering M.D.   On: 05/08/2018 20:30    (Echo, Carotid, EGD, Colonoscopy, ERCP)    Subjective:   Discharge Exam: Vitals:   05/10/18 0757 05/10/18 0859  BP:  Pulse:    Resp:    Temp:    SpO2: 90% 96%   Vitals:   05/10/18 0132 05/10/18 0504 05/10/18 0757 05/10/18 0859  BP:  108/61    Pulse:  62    Resp:  16    Temp:  97.7 F (36.5 C)    TempSrc:  Oral    SpO2: 98% 98% 90% 96%  Weight:      Height:        General: Pt is alert, awake, not in acute distress Cardiovascular: RRR, S1/S2 +, no rubs, no gallops Respiratory: scattered wheezing bilaterally, no wheezing, no  rhonchi Abdominal: Soft, NT, ND, bowel sounds + Extremities: no edema, no cyanosis    The results of significant diagnostics from this hospitalization (including imaging, microbiology, ancillary and laboratory) are listed below for reference.     Microbiology: No results found for this or any previous visit (from the past 240 hour(s)).   Labs: BNP (last 3 results) No results for input(s): BNP in the last 8760 hours. Basic Metabolic Panel: Recent Labs  Lab 05/08/18 1920 05/09/18 0536  NA 143 141  K 3.8 4.2  CL 106 104  CO2 28 26  GLUCOSE 105* 189*  BUN 9 8  CREATININE 0.81 0.74  CALCIUM 9.2 9.5   Liver Function Tests: No results for input(s): AST, ALT, ALKPHOS, BILITOT, PROT, ALBUMIN in the last 168 hours. No results for input(s): LIPASE, AMYLASE in the last 168 hours. No results for input(s): AMMONIA in the last 168 hours. CBC: Recent Labs  Lab 05/08/18 1920 05/09/18 0536  WBC 12.1* 11.0*  NEUTROABS 8.0*  --   HGB 12.5 12.1  HCT 38.9 38.2  MCV 89.0 88.4  PLT 270 283   Cardiac Enzymes: No results for input(s): CKTOTAL, CKMB, CKMBINDEX, TROPONINI in the last 168 hours. BNP: Invalid input(s): POCBNP CBG: No results for input(s): GLUCAP in the last 168 hours. D-Dimer No results for input(s): DDIMER in the last 72 hours. Hgb A1c No results for input(s): HGBA1C in the last 72 hours. Lipid Profile No results for input(s): CHOL, HDL, LDLCALC, TRIG, CHOLHDL, LDLDIRECT in the last 72 hours. Thyroid function studies No results for input(s): TSH, T4TOTAL, T3FREE, THYROIDAB in the last 72 hours.  Invalid input(s): FREET3 Anemia work up No results for input(s): VITAMINB12, FOLATE, FERRITIN, TIBC, IRON, RETICCTPCT in the last 72 hours. Urinalysis    Component Value Date/Time   COLORURINE YELLOW 04/06/2013 2155   APPEARANCEUR CLOUDY (A) 04/06/2013 2155   LABSPEC 1.020 04/06/2013 2155   PHURINE 7.5 04/06/2013 2155   GLUCOSEU NEGATIVE 04/06/2013 2155   HGBUR  NEGATIVE 04/06/2013 2155   BILIRUBINUR negative 12/02/2017 1343   KETONESUR negative 12/02/2017 1343   KETONESUR NEGATIVE 04/06/2013 2155   PROTEINUR negative 12/02/2017 1343   PROTEINUR NEGATIVE 04/06/2013 2155   UROBILINOGEN >=8.0 (A) 12/02/2017 1343   UROBILINOGEN 1.0 04/06/2013 2155   NITRITE Negative 12/02/2017 1343   NITRITE NEGATIVE 04/06/2013 2155   LEUKOCYTESUR Negative 12/02/2017 1343   Sepsis Labs Invalid input(s): PROCALCITONIN,  WBC,  LACTICIDVEN Microbiology No results found for this or any previous visit (from the past 240 hour(s)).   Time coordinating discharge: 34  minutes  SIGNED:   Georgette Shell, MD  [facility] 05/10/2018, 12:33 PM Pager   If 7PM-7AM, please contact night-coverage [email or web address] Password [email or web address]

## 2018-05-10 NOTE — Plan of Care (Signed)
Discharge instructions reviewed with patient and her sister.  Printed scripts given for PRednisone and Singulair, stressed the importance of taking both as prescribed and completing her dose of steroids.  Patient ambulatory from unit to be taken home by sister.

## 2018-05-26 ENCOUNTER — Encounter: Payer: Self-pay | Admitting: Family Medicine

## 2018-05-26 ENCOUNTER — Ambulatory Visit (INDEPENDENT_AMBULATORY_CARE_PROVIDER_SITE_OTHER): Payer: Self-pay | Admitting: Family Medicine

## 2018-05-26 VITALS — BP 96/56 | HR 78 | Temp 97.9°F | Resp 16 | Ht 59.0 in | Wt 159.0 lb

## 2018-05-26 DIAGNOSIS — J4520 Mild intermittent asthma, uncomplicated: Secondary | ICD-10-CM

## 2018-05-26 MED ORDER — IPRATROPIUM BROMIDE 0.02 % IN SOLN: 0.5000 mg | Freq: Four times a day (QID) | RESPIRATORY_TRACT | 12 refills | Status: DC | PRN

## 2018-05-26 MED ORDER — ALBUTEROL SULFATE (2.5 MG/3ML) 0.083% IN NEBU: 2.5000 mg | INHALATION_SOLUTION | Freq: Four times a day (QID) | RESPIRATORY_TRACT | 12 refills | Status: DC | PRN

## 2018-05-26 NOTE — Patient Instructions (Signed)

## 2018-05-26 NOTE — Progress Notes (Signed)
  Patient [facility]   Progress Note: General Provider: Lanae Boast, FNP  SUBJECTIVE:   Sherri Shaw is a 37 y.o. female who  has a past medical history of Anemia, Asthma, Bipolar disorder ([place]), Chronic bronchitis ([place]), Exertional shortness of breath, History of blood transfusion (04/07/2013), Microcytic anemia, and Schizophrenia ([place]).. Patient presents today for Follow-up She denies problems or concerns today. Review of Systems  Constitutional: Negative.   HENT: Negative.   Eyes: Negative.   Respiratory: Negative.   Cardiovascular: Negative.   Gastrointestinal: Negative.   Genitourinary: Negative.   Musculoskeletal: Negative.   Skin: Negative.   Neurological: Negative.   Psychiatric/Behavioral: Negative.      OBJECTIVE: BP (!) 96/56 (BP Location: Left Arm, Patient Position: Sitting, Cuff Size: Normal)   Pulse 78   Temp 97.9 F (36.6 C) (Oral)   Resp 16   Ht 4\' 11"  (1.499 m)   Wt 159 lb (72.1 kg)   LMP 05/25/2013   SpO2 100%   BMI 32.11 kg/m   Physical Exam  Constitutional: She is oriented to person, place, and time. She appears well-developed and well-nourished. No distress.  HENT:  Head: Normocephalic and atraumatic.  Eyes: Pupils are equal, round, and reactive to light. Conjunctivae and EOM are normal.  Neck: Normal range of motion.  Cardiovascular: Normal rate, regular rhythm, normal heart sounds and intact distal pulses.  Pulmonary/Chest: Effort normal and breath sounds normal. No respiratory distress.  Abdominal: Soft. Bowel sounds are normal. She exhibits no distension.  Musculoskeletal: Normal range of motion.  Neurological: She is alert and oriented to person, place, and time.  Skin: Skin is warm and dry.  Psychiatric: She has a normal mood and affect. Her behavior is normal. Thought content normal.  Nursing note and vitals reviewed.   ASSESSMENT/PLAN:  1. Mild intermittent asthma, unspecified whether  complicated Continue with current medications as previously prescribed.  Patient doing well on medications no changes warranted at the present time.  Medications refilled and sent to the pharmacy on file.  Patient advised to return to care if she develops any asthmatic symptoms.  Patient verbalized understanding and agree with plan of care will follow up as needed or in 6 months - ipratropium (ATROVENT) 0.02 % nebulizer solution; Take 2.5 mLs (0.5 mg total) by nebulization every 6 (six) hours as needed for wheezing or shortness of breath.  Dispense: 75 mL; Refill: 12 - albuterol (PROVENTIL) (2.5 MG/3ML) 0.083% nebulizer solution; Take 3 mLs (2.5 mg total) by nebulization every 6 (six) hours as needed for wheezing or shortness of breath.  Dispense: 75 mL; Refill: 12         The patient was given clear instructions to go to ER or return to medical center if symptoms do not improve, worsen or new problems develop. The patient verbalized understanding and agreed with plan of care.   [name], FNP-BC [facility] [address] [place], [place] [address] [phone]     This note has been created with Dragon speech recognition software and smart phrase technology. Any transcriptional errors are unintentional.

## 2018-07-01 ENCOUNTER — Emergency Department (HOSPITAL_COMMUNITY)
Admission: EM | Admit: 2018-07-01 | Discharge: 2018-07-01 | Disposition: A | Discharge status: Home / Self Care | Payer: Self-pay | Attending: Emergency Medicine | Admitting: Emergency Medicine

## 2018-07-01 ENCOUNTER — Emergency Department (HOSPITAL_COMMUNITY): Payer: Self-pay

## 2018-07-01 ENCOUNTER — Encounter (HOSPITAL_COMMUNITY): Payer: Self-pay

## 2018-07-01 ENCOUNTER — Other Ambulatory Visit: Payer: Self-pay

## 2018-07-01 DIAGNOSIS — F319 Bipolar disorder, unspecified: Secondary | ICD-10-CM | POA: Insufficient documentation

## 2018-07-01 DIAGNOSIS — J4520 Mild intermittent asthma, uncomplicated: Secondary | ICD-10-CM

## 2018-07-01 DIAGNOSIS — J45901 Unspecified asthma with (acute) exacerbation: Secondary | ICD-10-CM | POA: Insufficient documentation

## 2018-07-01 DIAGNOSIS — R0602 Shortness of breath: Secondary | ICD-10-CM | POA: Insufficient documentation

## 2018-07-01 LAB — CBC WITH DIFFERENTIAL/PLATELET
Abs Immature Granulocytes: 0.02 10*3/uL (ref 0.00–0.07)
Basophils Absolute: 0.1 10*3/uL (ref 0.0–0.1)
Basophils Relative: 1 %
EOS ABS: 1.3 10*3/uL — AB (ref 0.0–0.5)
EOS PCT: 14 %
HCT: 44.5 % (ref 36.0–46.0)
HEMOGLOBIN: 13.5 g/dL (ref 12.0–15.0)
Immature Granulocytes: 0 %
LYMPHS PCT: 25 %
Lymphs Abs: 2.3 10*3/uL (ref 0.7–4.0)
MCH: 27.8 pg (ref 26.0–34.0)
MCHC: 30.3 g/dL (ref 30.0–36.0)
MCV: 91.8 fL (ref 80.0–100.0)
MONO ABS: 0.7 10*3/uL (ref 0.1–1.0)
Monocytes Relative: 8 %
Neutro Abs: 4.8 10*3/uL (ref 1.7–7.7)
Neutrophils Relative %: 52 %
Platelets: 273 10*3/uL (ref 150–400)
RBC: 4.85 MIL/uL (ref 3.87–5.11)
RDW: 12.6 % (ref 11.5–15.5)
WBC: 9.1 10*3/uL (ref 4.0–10.5)
nRBC: 0 % (ref 0.0–0.2)

## 2018-07-01 LAB — BASIC METABOLIC PANEL
Anion gap: 10 (ref 5–15)
BUN: 6 mg/dL (ref 6–20)
CHLORIDE: 102 mmol/L (ref 98–111)
CO2: 27 mmol/L (ref 22–32)
CREATININE: 0.77 mg/dL (ref 0.44–1.00)
Calcium: 9.2 mg/dL (ref 8.9–10.3)
GFR calc Af Amer: >60 mL/min (ref >60)
GFR calc non Af Amer: >60 mL/min (ref >60)
Glucose, Bld: 81 mg/dL (ref 70–99)
Potassium: 3.8 mmol/L (ref 3.5–5.1)
SODIUM: 139 mmol/L (ref 135–145)

## 2018-07-01 MED ORDER — IPRATROPIUM-ALBUTEROL 0.5-2.5 (3) MG/3ML IN SOLN
3.0000 mL | Freq: Once | RESPIRATORY_TRACT | Status: AC
  Administered 2018-07-01: 3 mL via RESPIRATORY_TRACT
  Filled 2018-07-01: qty 3

## 2018-07-01 MED ORDER — IPRATROPIUM BROMIDE 0.02 % IN SOLN: 0.5000 mg | Freq: Four times a day (QID) | RESPIRATORY_TRACT | 0 refills | Status: DC | PRN

## 2018-07-01 MED ORDER — ALBUTEROL SULFATE (2.5 MG/3ML) 0.083% IN NEBU
5.0000 mg | INHALATION_SOLUTION | Freq: Once | RESPIRATORY_TRACT | Status: AC
  Administered 2018-07-01: 5 mg via RESPIRATORY_TRACT
  Filled 2018-07-01: qty 6

## 2018-07-01 MED ORDER — ALBUTEROL SULFATE (2.5 MG/3ML) 0.083% IN NEBU
2.5000 mg | INHALATION_SOLUTION | Freq: Once | RESPIRATORY_TRACT | Status: AC
  Administered 2018-07-01: 2.5 mg via RESPIRATORY_TRACT
  Filled 2018-07-01: qty 3

## 2018-07-01 MED ORDER — ALBUTEROL (5 MG/ML) CONTINUOUS INHALATION SOLN
10.0000 mg/h | INHALATION_SOLUTION | RESPIRATORY_TRACT | Status: DC
  Administered 2018-07-01: 10 mg/h via RESPIRATORY_TRACT
  Filled 2018-07-01: qty 20

## 2018-07-01 MED ORDER — PREDNISONE 20 MG PO TABS: 60.0000 mg | ORAL_TABLET | Freq: Every day | ORAL | 0 refills | Status: DC

## 2018-07-01 MED ORDER — ALBUTEROL SULFATE (2.5 MG/3ML) 0.083% IN NEBU: 2.5000 mg | INHALATION_SOLUTION | Freq: Four times a day (QID) | RESPIRATORY_TRACT | 0 refills | Status: DC | PRN

## 2018-07-01 MED ORDER — METHYLPREDNISOLONE SODIUM SUCC 125 MG IJ SOLR
125.0000 mg | Freq: Once | INTRAMUSCULAR | Status: AC
  Administered 2018-07-01: 125 mg via INTRAVENOUS
  Filled 2018-07-01: qty 2

## 2018-07-01 NOTE — ED Notes (Signed)
Respiratory at bedside.

## 2018-07-01 NOTE — ED Notes (Signed)
Respiratory notified need for continuous neb

## 2018-07-01 NOTE — ED Provider Notes (Signed)
[facility] DEPT Provider Note   CSN: [id number] Arrival date & time: 07/01/18  1000     History   Chief Complaint Chief Complaint  Patient presents with  . Asthma    HPI Sherri Shaw is a 37 y.o. female.  Sherri Shaw is a 37 y.o. Female with a history of asthma, chronic bronchitis, anemia and bipolar disorder, who she is presents to the emergency department for evaluation of shortness of breath and wheezing.  She reports that for the past 2 weeks her breathing has been off ever since she developed a cold with cough and nasal congestion.  Nasal congestion seems to be improved but patient continues to have intermittent productive cough and worsening shortness of breath and wheezing.  She reports that over the past few days is significantly worsened.  She has been using her albuterol inhaler and nebulizer at home, last treatment was this morning without improvement in her symptoms.  She denies fevers or chills.  No associated chest pain or palpitations, no lower extremity swelling.  No associated abdominal pain, nausea or vomiting.     Past Medical History:  Diagnosis Date  . Anemia   . Asthma   . Bipolar disorder [facility])    sister denies this hx on 04/07/2013  . Chronic bronchitis ([place])    "I get it q yr" (04/07/2013)  . Exertional shortness of breath    ? due to anemia  . History of blood transfusion 04/07/2013   "got my first transfusion today; got 4 units" (04/07/2013)  . Microcytic anemia    Archie Endo 04/07/2013  . Schizophrenia ([place])    "don't really know the type"/sister (04/07/2013)    Patient Active Problem List   Diagnosis Date Noted  . Sepsis ([place]) 08/01/2017  . Hypokalemia 08/01/2017  . Acute respiratory failure with hypoxia ([place]) 08/01/2017  . Anemia   . Schizophrenia ([place])   . Asthma exacerbation 09/03/2014  . Schizo-affective schizophrenia ([place]) 04/10/2013  . Microcytic anemia 04/07/2013  . Uterine mass 04/07/2013  . Trichomonas  04/07/2013  . Bipolar disorder ([place]) 04/07/2013  . Bilateral lower extremity edema 04/07/2013    Past Surgical History:  Procedure Laterality Date  . ABDOMINAL HYSTERECTOMY N/A 06/01/2013   Procedure: HYSTERECTOMY ABDOMINAL;  Surgeon: Lavonia Drafts, MD;  Location: [facility];  Service: Gynecology;  Laterality: N/A;  . BILATERAL SALPINGECTOMY Bilateral 06/01/2013   Procedure: BILATERAL SALPINGECTOMY;  Surgeon: Lavonia Drafts, MD;  Location: [facility];  Service: Gynecology;  Laterality: Bilateral;  . CYSTOSCOPY W/ URETERAL STENT PLACEMENT Bilateral 06/01/2013   Procedure: CYSTOSCOPY WITH BILATERAL STENT REPLACEMENT;  Surgeon: Irine Seal, MD;  Location: [facility];  Service: Urology;  Laterality: Bilateral;  . HYSTERECTOMY ABDOMINAL WITH SALPINGECTOMY    . NO PAST SURGERIES       OB History    Gravida  0   Para  0   Term  0   Preterm  0   AB  0   Living  0     SAB  0   TAB  0   Ectopic  0   Multiple  0   Live Births               Home Medications    Prior to Admission medications   Medication Sig Start Date End Date Taking? Authorizing Provider  albuterol (PROVENTIL HFA;VENTOLIN HFA) 108 (90 Base) MCG/ACT inhaler Inhale 2 puffs into the lungs every 4 (four) hours as needed for wheezing or shortness of breath. 08/15/17  Yes Dorena Dew, FNP  albuterol (PROVENTIL) (2.5 MG/3ML) 0.083% nebulizer solution Take 3 mLs (2.5 mg total) by nebulization every 6 (six) hours as needed for wheezing or shortness of breath. 07/01/18   Jacqlyn Larsen, PA-C  ipratropium (ATROVENT) 0.02 % nebulizer solution Take 2.5 mLs (0.5 mg total) by nebulization every 6 (six) hours as needed for wheezing or shortness of breath. 07/01/18   Jacqlyn Larsen, PA-C  montelukast (SINGULAIR) 10 MG tablet Take 1 tablet (10 mg total) by mouth at bedtime. Patient not taking: Reported on 05/26/2018 05/10/18   Georgette Shell, MD  predniSONE (DELTASONE) 20 MG tablet Take 3 tablets (60 mg  total) by mouth daily for 5 days. 07/01/18 07/06/18  Jacqlyn Larsen, PA-C    Family History Family History  Problem Relation Age of Onset  . Hypertension Mother   . Diabetes Mother   . Asthma Mother   . Stroke Mother   . Cancer Mother   . Asthma Brother   . Heart disease Maternal Aunt     Social History Social History   Tobacco Use  . Smoking status: Never Smoker  . Smokeless tobacco: Never Used  Substance Use Topics  . Alcohol use: No  . Drug use: No     Allergies   Patient has no known allergies.   Review of Systems Review of Systems  Constitutional: Negative for chills and fever.  HENT: Negative for congestion, rhinorrhea and sore throat.   Eyes: Negative for visual disturbance.  Respiratory: Positive for cough, shortness of breath and wheezing. Negative for chest tightness.   Cardiovascular: Negative for chest pain, palpitations and leg swelling.  Gastrointestinal: Negative for abdominal pain, nausea and vomiting.  Genitourinary: Negative for dysuria, flank pain, frequency and hematuria.  Musculoskeletal: Negative for arthralgias and myalgias.  Skin: Negative for color change and rash.  Neurological: Negative for syncope, light-headedness and headaches.     Physical Exam Updated Vital Signs BP 132/88 (BP Location: Left Arm)   Pulse 76   Temp 98.1 F (36.7 C) (Oral)   Resp 17   Ht 5\' 3"  (1.6 m)   Wt 63.5 kg   LMP 05/25/2013   SpO2 99%   BMI 24.80 kg/m   Physical Exam  Constitutional: She is oriented to person, place, and time. She appears well-developed and well-nourished. No distress.  HENT:  Head: Normocephalic and atraumatic.  Mouth/Throat: Oropharynx is clear and moist.  Eyes: Right eye exhibits no discharge. Left eye exhibits no discharge.  Neck: Neck supple.  Cardiovascular: Normal rate, regular rhythm, normal heart sounds and intact distal pulses. Exam reveals no gallop and no friction rub.  No murmur heard. Pulmonary/Chest: Effort normal.  No respiratory distress. She has wheezes.  Patient's respirations are equal and unlabored and she is able to speak in full sentences, on exam patient has diffuse wheezing throughout bilateral lung fields with some decreased air movement.  Abdominal: Soft. Bowel sounds are normal. She exhibits no distension and no mass. There is no tenderness. There is no guarding.  Abdomen soft, nondistended, nontender to palpation in all quadrants without guarding or peritoneal signs  Musculoskeletal: She exhibits no edema or deformity.  Bilateral lower extremities without edema or tenderness  Neurological: She is alert and oriented to person, place, and time. Coordination normal.  Skin: Skin is warm and dry. Capillary refill takes less than 2 seconds. She is not diaphoretic.  Psychiatric: She has a normal mood and affect. Her behavior is normal.  Nursing note  and vitals reviewed.    ED Treatments / Results  Labs (all labs ordered are listed, but only abnormal results are displayed) Labs Reviewed  CBC WITH DIFFERENTIAL/PLATELET - Abnormal; Notable for the following components:      Result Value   Eosinophils Absolute 1.3 (*)    All other components within normal limits  BASIC METABOLIC PANEL    EKG EKG Interpretation  Date/Time:  Tuesday July 01 2018 11:36:07 EST Ventricular Rate:  66 PR Interval:    QRS Duration: 90 QT Interval:  414 QTC Calculation: 434 R Axis:   51 Text Interpretation:  Sinus rhythm Probable anterior infarct, age indeterminate Confirmed by Veryl Speak ([id number]) on 07/01/2018 11:40:59 AM   Radiology Dg Chest 2 View  Result Date: 07/01/2018 CLINICAL DATA:  Shortness of breath.  Cough. EXAM: CHEST - 2 VIEW COMPARISON:  05/08/2018. FINDINGS: Mediastinum and hilar structures normal. Lungs are clear. No pleural effusion or pneumothorax. Heart size normal. No acute bony abnormality. IMPRESSION: No acute abnormality. Electronically Signed   By: Marcello Moores  Register   On:  07/01/2018 11:31    Procedures Procedures (including critical care time)  Medications Ordered in ED Medications  albuterol (PROVENTIL,VENTOLIN) solution continuous neb (0 mg/hr Nebulization Stopped 07/01/18 1455)  albuterol (PROVENTIL) (2.5 MG/3ML) 0.083% nebulizer solution 5 mg (5 mg Nebulization Given 07/01/18 1010)  ipratropium-albuterol (DUONEB) 0.5-2.5 (3) MG/3ML nebulizer solution 3 mL (3 mLs Nebulization Given 07/01/18 1152)  albuterol (PROVENTIL) (2.5 MG/3ML) 0.083% nebulizer solution 2.5 mg (2.5 mg Nebulization Given 07/01/18 1152)  methylPREDNISolone sodium succinate (SOLU-MEDROL) 125 mg/2 mL injection 125 mg (125 mg Intravenous Given 07/01/18 1152)     Initial Impression / Assessment and Plan / ED Course  I have reviewed the triage vital signs and the nursing notes.  Pertinent labs & imaging results that were available during my care of the patient were reviewed by me and considered in my medical decision making (see chart for details).  Patient presents with wheezing, shortness of breath of cough consistent with her usual asthma exacerbation reports her breathing has not been quite right over the past 2 weeks but has been getting significantly worse over the past 3 days.  She has been using her nebulizer machine and inhalers at home without improvement.  She denies any fevers.  No chest pain or abdominal pain.  On evaluation patient has stable vitals, mildly tachypneic but in no acute respiratory distress, lungs with diffuse wheezing throughout bilateral lung fields with some decreased air movement.  We will get basic labs, chest x-ray and EKG.  Chest x-ray reassuring no signs of pneumonia or other active cardiopulmonary disease.  EKG without concerning ischemic changes.  No leukocytosis and normal hemoglobin and no acute electrolyte derangements.  After initial nebulizer treatment patient reports some improvement in her symptoms but still has significant wheezing on exam.  Vitals  remained stable with normal O2 sats.  Will give 1 hour continuous albuterol nebulizer.  After 1 hour of continuous nebs patient reports significant improvement in her shortness of breath and on reexamination she has only a few faint scattered end expiratory wheezes with excellent air movement.  Patient ambulated in the department while maintaining normal O2 saturations and no increased work of breathing.  At this time I feel she is stable for discharge home.  Will provide a 5-day burst of steroids to help continue to improve asthma exacerbation, I have encouraged the patient to use nebulizer treatments every 4-6 hours for the next 24 hours and then as  needed for shortness of breath and wheezing.  Return precautions been discussed and patient has been encouraged to follow-up with her PCP.  She expresses understanding and is in agreement with plan, stable for discharge home at this time.  Final Clinical Impressions(s) / ED Diagnoses   Final diagnoses:  Severe asthma with exacerbation, unspecified whether persistent    ED Discharge Orders         Ordered    albuterol (PROVENTIL) (2.5 MG/3ML) 0.083% nebulizer solution  Every 6 hours PRN     07/01/18 1527    ipratropium (ATROVENT) 0.02 % nebulizer solution  Every 6 hours PRN     07/01/18 1527    predniSONE (DELTASONE) 20 MG tablet  Daily     07/01/18 1527           Jacqlyn Larsen, [place] 07/01/18 1644    Veryl Speak, MD 07/02/18 2113

## 2018-07-01 NOTE — ED Notes (Signed)
Pt ambulated without difficulty. Pts O2 95-96% during ambulation. Pts HR-106pbm. PA made aware

## 2018-07-01 NOTE — ED Notes (Signed)
Pt reports feeling better after neb

## 2018-07-01 NOTE — ED Triage Notes (Signed)
Pt states that she has hx of asthma, which is exacerbated by the cold. Pt states it has been getting progressively worse the last 2 weeks.  Pt states she has an inhaler and a nebulizer at home, but she has run out of meds.

## 2018-07-01 NOTE — Discharge Instructions (Signed)
Your evaluation today is reassuring, please take prednisone for the next 5 days as directed to help prevent worsening of your asthma exacerbation, use nebulizer treatments every 4-6 hours for the next 24 hours and then as needed for wheezing or shortness of breath.  Follow-up with your primary care doctor.  Return to the emergency department for worsening shortness of breath, cough, wheezing, fevers or any other new or concerning symptoms.

## 2018-07-14 ENCOUNTER — Ambulatory Visit (INDEPENDENT_AMBULATORY_CARE_PROVIDER_SITE_OTHER): Payer: Self-pay | Admitting: Family Medicine

## 2018-07-14 ENCOUNTER — Encounter: Payer: Self-pay | Admitting: Family Medicine

## 2018-07-14 VITALS — BP 112/64 | HR 91 | Temp 97.3°F | Resp 16 | Ht 59.0 in | Wt 154.0 lb

## 2018-07-14 DIAGNOSIS — J4530 Mild persistent asthma, uncomplicated: Secondary | ICD-10-CM

## 2018-07-14 MED ORDER — PREDNISONE 20 MG PO TABS: 60.0000 mg | ORAL_TABLET | Freq: Every day | ORAL | 0 refills | Status: AC

## 2018-07-14 MED ORDER — MONTELUKAST SODIUM 10 MG PO TABS: 10.0000 mg | ORAL_TABLET | Freq: Every day | ORAL | 5 refills | Status: DC

## 2018-07-14 MED ORDER — ALBUTEROL SULFATE HFA 108 (90 BASE) MCG/ACT IN AERS: 2.0000 | INHALATION_SPRAY | RESPIRATORY_TRACT | 11 refills | Status: DC | PRN

## 2018-07-14 MED ORDER — ALBUTEROL SULFATE (2.5 MG/3ML) 0.083% IN NEBU: 2.5000 mg | INHALATION_SOLUTION | Freq: Four times a day (QID) | RESPIRATORY_TRACT | 11 refills | Status: DC | PRN

## 2018-07-14 MED ORDER — PREDNISONE 20 MG PO TABS: 60.0000 mg | ORAL_TABLET | Freq: Every day | ORAL | 0 refills | Status: DC

## 2018-07-14 MED ORDER — IPRATROPIUM BROMIDE 0.02 % IN SOLN: 0.5000 mg | Freq: Four times a day (QID) | RESPIRATORY_TRACT | 11 refills | Status: DC | PRN

## 2018-07-14 MED FILL — ALBUTEROL SUL 2.5 MG/3 ML S: (2.5 MG/3ML | 21 days supply | Qty: 90 | Fill #0

## 2018-07-14 MED FILL — predniSONE 20 MG TABS: 20 | 5 days supply | Qty: 15 | Fill #0

## 2018-07-14 MED FILL — MONTELUKAST SOD 10 MG TAB: 10 | 30 days supply | Qty: 30 | Fill #0

## 2018-07-14 MED FILL — IPRATROPIUM BR 0.02% SOLN: 0.02 | 6 days supply | Qty: 63 | Fill #0

## 2018-07-14 NOTE — Progress Notes (Signed)
  Patient [name]   Progress Note: General Provider: Lanae Boast, FNP  SUBJECTIVE:   Sherri Shaw is a 37 y.o. female who  has a past medical history of Anemia, Asthma, Bipolar disorder ([place]), Chronic bronchitis ([place]), Exertional shortness of breath, History of blood transfusion (04/07/2013), Microcytic anemia, and Schizophrenia ([place]).. Patient presents today for Follow-up (er follow up for wheezing )  Patient seen in the ED on 07/01/2018 due to an asthma exacerbation. Patient given prednisone  Review of Systems  Constitutional: Negative.   HENT: Negative.   Eyes: Negative.   Respiratory: Positive for wheezing.   Cardiovascular: Negative.   Gastrointestinal: Negative.   Genitourinary: Negative.   Musculoskeletal: Negative.   Skin: Negative.   Neurological: Negative.   Psychiatric/Behavioral: Negative.      OBJECTIVE: BP 112/64 (BP Location: Left Arm, Patient Position: Sitting, Cuff Size: Normal)   Pulse 91   Temp (!) 97.3 F (36.3 C) (Oral)   Resp 16   Ht 4\' 11"  (1.499 m)   Wt 154 lb (69.9 kg)   LMP 05/25/2013   SpO2 99%   BMI 31.10 kg/m   Wt Readings from Last 3 Encounters:  07/14/18 154 lb (69.9 kg)  07/01/18 140 lb (63.5 kg)  05/26/18 159 lb (72.1 kg)     Physical Exam  Constitutional: She is oriented to person, place, and time. She appears well-developed and well-nourished. No distress.  HENT:  Head: Normocephalic and atraumatic.  Eyes: Pupils are equal, round, and reactive to light. Conjunctivae and EOM are normal.  Neck: Normal range of motion.  Cardiovascular: Normal rate, regular rhythm, normal heart sounds and intact distal pulses.  Pulmonary/Chest: Effort normal. No respiratory distress. She has wheezes.  Musculoskeletal: Normal range of motion.  Neurological: She is alert and oriented to person, place, and time.  Skin: Skin is warm and dry.  Psychiatric: She has a normal mood and affect. Her behavior is  normal. Judgment and thought content normal.  Nursing note and vitals reviewed.   ASSESSMENT/PLAN:    Mild persistent asthma without complication - ipratropium (ATROVENT) 0.02 % nebulizer solution; Take 2.5 mLs (0.5 mg total) by nebulization every 6 (six) hours as needed for wheezing or shortness of breath.  Dispense: 75 mL; Refill: 11 - albuterol (PROVENTIL HFA;VENTOLIN HFA) 108 (90 Base) MCG/ACT inhaler; Inhale 2 puffs into the lungs every 4 (four) hours as needed for wheezing or shortness of breath.  Dispense: 1 Inhaler; Refill: 11 - albuterol (PROVENTIL) (2.5 MG/3ML) 0.083% nebulizer solution; Take 3 mLs (2.5 mg total) by nebulization every 6 (six) hours as needed for wheezing or shortness of breath.  Dispense: 75 mL; Refill: 11 - montelukast (SINGULAIR) 10 MG tablet; Take 1 tablet (10 mg total) by mouth at bedtime.  Dispense: 30 tablet; Refill: 5        The patient was given clear instructions to go to ER or return to medical center if symptoms do not improve, worsen or new problems develop. The patient verbalized understanding and agreed with plan of care.   Sherri Shaw. Sherri Canary, FNP-BC Patient Sherri Shaw [address] [place], [place] [address] [phone]     This note has been created with Dragon speech recognition software and smart phrase technology. Any transcriptional errors are unintentional.

## 2018-07-14 NOTE — Patient Instructions (Signed)

## 2018-09-15 DIAGNOSIS — E876 Hypokalemia: Secondary | ICD-10-CM | POA: Insufficient documentation

## 2018-09-15 DIAGNOSIS — J4541 Moderate persistent asthma with (acute) exacerbation: Secondary | ICD-10-CM | POA: Insufficient documentation

## 2018-09-15 DIAGNOSIS — J45909 Unspecified asthma, uncomplicated: Secondary | ICD-10-CM | POA: Insufficient documentation

## 2018-09-16 ENCOUNTER — Encounter (HOSPITAL_COMMUNITY): Payer: Self-pay

## 2018-09-16 ENCOUNTER — Emergency Department (HOSPITAL_COMMUNITY): Payer: Self-pay

## 2018-09-16 ENCOUNTER — Other Ambulatory Visit: Payer: Self-pay

## 2018-09-16 ENCOUNTER — Emergency Department (HOSPITAL_COMMUNITY)
Admission: EM | Admit: 2018-09-16 | Discharge: 2018-09-16 | Disposition: A | Discharge status: Home / Self Care | Payer: Self-pay | Attending: Emergency Medicine | Admitting: Emergency Medicine

## 2018-09-16 DIAGNOSIS — J4541 Moderate persistent asthma with (acute) exacerbation: Secondary | ICD-10-CM

## 2018-09-16 DIAGNOSIS — E876 Hypokalemia: Secondary | ICD-10-CM

## 2018-09-16 DIAGNOSIS — J4530 Mild persistent asthma, uncomplicated: Secondary | ICD-10-CM

## 2018-09-16 LAB — CBC WITH DIFFERENTIAL/PLATELET
Abs Immature Granulocytes: 0.03 10*3/uL (ref 0.00–0.07)
Basophils Absolute: 0 10*3/uL (ref 0.0–0.1)
Basophils Relative: 0 %
Eosinophils Absolute: 0 10*3/uL (ref 0.0–0.5)
Eosinophils Relative: 0 %
HCT: 37.9 % (ref 36.0–46.0)
Hemoglobin: 11.4 g/dL — ABNORMAL LOW (ref 12.0–15.0)
Immature Granulocytes: 0 %
Lymphocytes Relative: 7 %
Lymphs Abs: 0.7 10*3/uL (ref 0.7–4.0)
MCH: 27.1 pg (ref 26.0–34.0)
MCHC: 30.1 g/dL (ref 30.0–36.0)
MCV: 90 fL (ref 80.0–100.0)
Monocytes Absolute: 0.1 10*3/uL (ref 0.1–1.0)
Monocytes Relative: 1 %
NRBC: 0 % (ref 0.0–0.2)
Neutro Abs: 8.6 10*3/uL — ABNORMAL HIGH (ref 1.7–7.7)
Neutrophils Relative %: 92 %
Platelets: 240 10*3/uL (ref 150–400)
RBC: 4.21 MIL/uL (ref 3.87–5.11)
RDW: 13.2 % (ref 11.5–15.5)
WBC: 9.3 10*3/uL (ref 4.0–10.5)

## 2018-09-16 LAB — BASIC METABOLIC PANEL
Anion gap: 12 (ref 5–15)
BUN: 5 mg/dL — ABNORMAL LOW (ref 6–20)
CO2: 22 mmol/L (ref 22–32)
Calcium: 8.5 mg/dL — ABNORMAL LOW (ref 8.9–10.3)
Chloride: 105 mmol/L (ref 98–111)
Creatinine, Ser: 0.87 mg/dL (ref 0.44–1.00)
GFR calc Af Amer: >60 mL/min (ref >60)
GFR calc non Af Amer: >60 mL/min (ref >60)
GLUCOSE: 228 mg/dL — AB (ref 70–99)
Potassium: 2.3 mmol/L — CL (ref 3.5–5.1)
Sodium: 139 mmol/L (ref 135–145)

## 2018-09-16 MED ORDER — IPRATROPIUM BROMIDE 0.02 % IN SOLN
0.5000 mg | Freq: Once | RESPIRATORY_TRACT | Status: AC
  Administered 2018-09-16: 0.5 mg via RESPIRATORY_TRACT
  Filled 2018-09-16: qty 2.5

## 2018-09-16 MED ORDER — IPRATROPIUM BROMIDE 0.02 % IN SOLN: 0.5000 mg | Freq: Four times a day (QID) | RESPIRATORY_TRACT | 11 refills | Status: DC | PRN

## 2018-09-16 MED ORDER — ALBUTEROL SULFATE (2.5 MG/3ML) 0.083% IN NEBU: 2.5000 mg | INHALATION_SOLUTION | Freq: Four times a day (QID) | RESPIRATORY_TRACT | 11 refills | Status: DC | PRN

## 2018-09-16 MED ORDER — ALBUTEROL (5 MG/ML) CONTINUOUS INHALATION SOLN
10.0000 mg/h | INHALATION_SOLUTION | RESPIRATORY_TRACT | Status: DC
  Administered 2018-09-16: 10 mg/h via RESPIRATORY_TRACT
  Filled 2018-09-16: qty 20

## 2018-09-16 MED ORDER — PREDNISONE 20 MG PO TABS
60.0000 mg | ORAL_TABLET | Freq: Once | ORAL | Status: AC
  Administered 2018-09-16: 60 mg via ORAL
  Filled 2018-09-16: qty 3

## 2018-09-16 MED ORDER — PREDNISONE 10 MG PO TABS: 40.0000 mg | ORAL_TABLET | Freq: Every day | ORAL | 0 refills | Status: AC

## 2018-09-16 MED ORDER — POTASSIUM CHLORIDE CRYS ER 20 MEQ PO TBCR
40.0000 meq | EXTENDED_RELEASE_TABLET | Freq: Once | ORAL | Status: AC
  Administered 2018-09-16: 40 meq via ORAL
  Filled 2018-09-16: qty 2

## 2018-09-16 MED ORDER — ALBUTEROL SULFATE (2.5 MG/3ML) 0.083% IN NEBU
5.0000 mg | INHALATION_SOLUTION | Freq: Once | RESPIRATORY_TRACT | Status: AC
  Administered 2018-09-16: 5 mg via RESPIRATORY_TRACT
  Filled 2018-09-16: qty 6

## 2018-09-16 MED ORDER — ALBUTEROL SULFATE HFA 108 (90 BASE) MCG/ACT IN AERS
4.0000 | INHALATION_SPRAY | Freq: Once | RESPIRATORY_TRACT | Status: AC
  Administered 2018-09-16: 4 via RESPIRATORY_TRACT
  Filled 2018-09-16: qty 6.7

## 2018-09-16 MED ORDER — POTASSIUM CHLORIDE ER 10 MEQ PO TBCR: 10.0000 meq | EXTENDED_RELEASE_TABLET | Freq: Two times a day (BID) | ORAL | 0 refills | Status: DC

## 2018-09-16 MED FILL — IPRATROPIUM BR 0.02% SOLN: 0.02 | 6 days supply | Qty: 188 | Fill #0

## 2018-09-16 MED FILL — predniSONE 10 MG TABS: 10 | 4 days supply | Qty: 16 | Fill #0

## 2018-09-16 MED FILL — ALBUTEROL SUL 2.5 MG/3 ML S: (2.5 MG/3ML | 6 days supply | Qty: 75 | Fill #0

## 2018-09-16 MED FILL — POTASSIUM CHLORIDE ER 10 ME: 10 | 5 days supply | Qty: 10 | Fill #0

## 2018-09-16 NOTE — ED Triage Notes (Signed)
Pt complains of being short of breath for several weeks, she doesn't have her inhaler or any treatments at home

## 2018-09-16 NOTE — ED Notes (Signed)
Bed: IX65 Expected date: 09/16/18 Expected time:  Means of arrival:  Comments:

## 2018-09-16 NOTE — ED Provider Notes (Signed)
[facility] DEPT Provider Note  CSN: [id number] Arrival date & time: 09/15/18 2352  Chief Complaint(s) Asthma  HPI Sherri Shaw is a 38 y.o. female with a history of asthma who presents to the emergency department with 1 day of gradually worsening shortness of breath.  Shortness of breath worse with exertion and coughing.  Endorses dry cough.  She endorses several weeks of URI symptoms.  Denies any fevers or chills.  No chest pain.  Patient reports that she ran out of her nebulizer and rescue inhaler several weeks ago.  Patient denies any tobacco smoke.  Denies any other alleviating or aggravating factors.  Denies any other physical complaints.     Past Medical History Past Medical History:  Diagnosis Date  . Anemia   . Asthma   . Bipolar disorder [facility])    sister denies this hx on 04/07/2013  . Chronic bronchitis ([place])    "I get it q yr" (04/07/2013)  . Exertional shortness of breath    ? due to anemia  . History of blood transfusion 04/07/2013   "got my first transfusion today; got 4 units" (04/07/2013)  . Microcytic anemia    Archie Endo 04/07/2013  . Schizophrenia ([place])    "don't really know the type"/sister (04/07/2013)   Patient Active Problem List   Diagnosis Date Noted  . Sepsis ([place]) 08/01/2017  . Hypokalemia 08/01/2017  . Acute respiratory failure with hypoxia ([place]) 08/01/2017  . Anemia   . Schizophrenia ([place])   . Asthma exacerbation 09/03/2014  . Schizo-affective schizophrenia ([place]) 04/10/2013  . Microcytic anemia 04/07/2013  . Uterine mass 04/07/2013  . Trichomonas 04/07/2013  . Bipolar disorder ([place]) 04/07/2013  . Bilateral lower extremity edema 04/07/2013   Home Medication(s) Prior to Admission medications   Medication Sig Start Date End Date Taking? Authorizing Provider  albuterol (PROVENTIL HFA;VENTOLIN HFA) 108 (90 Base) MCG/ACT inhaler Inhale 2 puffs into the lungs every 4 (four) hours as needed for wheezing or shortness of  breath. Patient not taking: Reported on 09/16/2018 07/14/18   Lanae Boast, FNP  albuterol (PROVENTIL) (2.5 MG/3ML) 0.083% nebulizer solution Take 3 mLs (2.5 mg total) by nebulization every 6 (six) hours as needed for wheezing or shortness of breath. 09/16/18   Fatima Blank, MD  ipratropium (ATROVENT) 0.02 % nebulizer solution Take 2.5 mLs (0.5 mg total) by nebulization every 6 (six) hours as needed for wheezing or shortness of breath. 09/16/18   Fatima Blank, MD  montelukast (SINGULAIR) 10 MG tablet Take 1 tablet (10 mg total) by mouth at bedtime. Patient not taking: Reported on 09/16/2018 07/14/18   Lanae Boast, FNP  potassium chloride (K-DUR) 10 MEQ tablet Take 1 tablet (10 mEq total) by mouth 2 (two) times daily for 5 days. 09/16/18 09/21/18  Fatima Blank, MD  predniSONE (DELTASONE) 10 MG tablet Take 4 tablets (40 mg total) by mouth daily for 4 days. 09/16/18 09/20/18  Fatima Blank, MD  Past Surgical History Past Surgical History:  Procedure Laterality Date  . ABDOMINAL HYSTERECTOMY N/A 06/01/2013   Procedure: HYSTERECTOMY ABDOMINAL;  Surgeon: Lavonia Drafts, MD;  Location: [facility];  Service: Gynecology;  Laterality: N/A;  . BILATERAL SALPINGECTOMY Bilateral 06/01/2013   Procedure: BILATERAL SALPINGECTOMY;  Surgeon: Lavonia Drafts, MD;  Location: [facility];  Service: Gynecology;  Laterality: Bilateral;  . CYSTOSCOPY W/ URETERAL STENT PLACEMENT Bilateral 06/01/2013   Procedure: CYSTOSCOPY WITH BILATERAL STENT REPLACEMENT;  Surgeon: Irine Seal, MD;  Location: [facility];  Service: Urology;  Laterality: Bilateral;  . HYSTERECTOMY ABDOMINAL WITH SALPINGECTOMY    . NO PAST SURGERIES     Family History Family History  Problem Relation Age of Onset  . Hypertension Mother   . Diabetes Mother   . Asthma Mother   . Stroke  Mother   . Cancer Mother   . Asthma Brother   . Heart disease Maternal Aunt     Social History Social History   Tobacco Use  . Smoking status: Never Smoker  . Smokeless tobacco: Never Used  Substance Use Topics  . Alcohol use: No  . Drug use: No   Allergies Patient has no known allergies.  Review of Systems Review of Systems All other systems are reviewed and are negative for acute change except as noted in the HPI  Physical Exam Vital Signs  I have reviewed the triage vital signs BP 126/81 (BP Location: Right Arm)   Pulse 79   Temp 98 F (36.7 C) (Oral)   Resp 17   Ht 5\' 2"  (1.575 m)   Wt 68 kg   LMP 05/25/2013   SpO2 97%   BMI 27.44 kg/m   Physical Exam Vitals signs reviewed.  Constitutional:      General: She is not in acute distress.    Appearance: She is well-developed. She is not diaphoretic.  HENT:     Head: Normocephalic and atraumatic.     Nose: Nose normal.  Eyes:     General: No scleral icterus.       Right eye: No discharge.        Left eye: No discharge.     Conjunctiva/sclera: Conjunctivae normal.     Pupils: Pupils are equal, round, and reactive to light.  Neck:     Musculoskeletal: Normal range of motion and neck supple.  Cardiovascular:     Rate and Rhythm: Normal rate and regular rhythm.     Heart sounds: No murmur. No friction rub. No gallop.   Pulmonary:     Effort: Tachypnea present. No respiratory distress.     Breath sounds: No stridor or decreased air movement. Wheezing (insp and exp; diffuse) present. No rhonchi or rales.  Abdominal:     General: There is no distension.     Palpations: Abdomen is soft.     Tenderness: There is no abdominal tenderness.  Musculoskeletal:        General: No tenderness.  Skin:    General: Skin is warm and dry.     Findings: No erythema or rash.  Neurological:     Mental Status: She is alert and oriented to person, place, and time.     ED Results and Treatments Labs (all labs ordered are  listed, but only abnormal results are displayed) Labs Reviewed  CBC WITH DIFFERENTIAL/PLATELET - Abnormal; Notable for the following components:      Result Value   Hemoglobin 11.4 (*)    Neutro Abs 8.6 (*)    All  other components within normal limits  BASIC METABOLIC PANEL - Abnormal; Notable for the following components:   Potassium 2.3 (*)    Glucose, Bld 228 (*)    BUN 5 (*)    Calcium 8.5 (*)    All other components within normal limits                                                                                                                         EKG  EKG Interpretation  Date/Time:    Ventricular Rate:    PR Interval:    QRS Duration:   QT Interval:    QTC Calculation:   R Axis:     Text Interpretation:        Radiology Dg Chest 2 View  Result Date: 09/16/2018 CLINICAL DATA:  37 y/o  F; shortness of breath. EXAM: CHEST - 2 VIEW COMPARISON:  07/01/2018 chest radiograph FINDINGS: Stable heart size and mediastinal contours are within normal limits. Both lungs are clear. The visualized skeletal structures are unremarkable. IMPRESSION: No acute pulmonary process identified. Electronically Signed   By: Kristine Garbe M.D.   On: 09/16/2018 00:35   Pertinent labs & imaging results that were available during my care of the patient were reviewed by me and considered in my medical decision making (see chart for details).  Medications Ordered in ED Medications  albuterol (PROVENTIL,VENTOLIN) solution continuous neb (0 mg/hr Nebulization Stopped 09/16/18 0424)  potassium chloride SA (K-DUR,KLOR-CON) CR tablet 40 mEq (has no administration in time range)  albuterol (PROVENTIL HFA;VENTOLIN HFA) 108 (90 Base) MCG/ACT inhaler 4 puff (has no administration in time range)  albuterol (PROVENTIL) (2.5 MG/3ML) 0.083% nebulizer solution 5 mg (5 mg Nebulization Given 09/16/18 0006)  ipratropium (ATROVENT) nebulizer solution 0.5 mg (0.5 mg Nebulization Given 09/16/18 0140)   predniSONE (DELTASONE) tablet 60 mg (60 mg Oral Given 09/16/18 0118)                                                                                                                                    Procedures Procedures CRITICAL CARE Performed by: Grayce Sessions Envi Eagleson Total critical care time: 60 minutes Critical care time was exclusive of separately billable procedures and treating other patients. Critical care was necessary to treat or prevent imminent or life-threatening deterioration. Critical care was time spent personally by me on the following activities: development of treatment plan with patient and/or  surrogate as well as nursing, discussions with consultants, evaluation of patient's response to treatment, examination of patient, obtaining history from patient or surrogate, ordering and performing treatments and interventions, ordering and review of laboratory studies, ordering and review of radiographic studies, pulse oximetry and re-evaluation of patient's condition.   (including critical care time)  Medical Decision Making / ED Course I have reviewed the nursing notes for this encounter and the patient's prior records (if available in EHR or on provided paperwork).    Patient presents with shortness of breath and diffuse wheezing consistent with asthma exacerbation.  Noted to be hypoxic with saturations in the 70s on room air.  Improved after breathing treatment.  Patient still with diffuse wheezing.  Will place on continuous DuoNeb and provide oral steroids.  Chest x-ray without evidence of pneumonia, pulmonary edema, pneumothorax.  On reassessment patient still had diffuse wheezing and mild shortness of breath.  Additional breathing treatment was provided.  Screening labs were drawn in case patient required admission.  There was a delay in getting labs.  During that time patient was reevaluated and had significant improvement in air movement and no wheezing.  She was  ambulated on pulse ox and satted 100% on room air.  She denied any shortness of breath.  Labs returned without leukocytosis or significant anemia.  BMP was notable for hypo-kalemia at 2.3, likely related to shift from albuterol.  She was given a dose of Kdur.   Given her significant improvement, feel the patient should be appropriate for discharge home.  Patient is amenable to this.  Strict return precautions were given.  Final Clinical Impression(s) / ED Diagnoses Final diagnoses:  Moderate persistent asthma with exacerbation  Hypokalemia   Disposition: Discharge  Condition: Good  I have discussed the results, Dx and Tx plan with the patient who expressed understanding and agree(s) with the plan. Discharge instructions discussed at great length. The patient was given strict return precautions who verbalized understanding of the instructions. No further questions at time of discharge.    ED Discharge Orders         Ordered    albuterol (PROVENTIL) (2.5 MG/3ML) 0.083% nebulizer solution  Every 6 hours PRN     09/16/18 0704    ipratropium (ATROVENT) 0.02 % nebulizer solution  Every 6 hours PRN     09/16/18 0704    potassium chloride (K-DUR) 10 MEQ tablet  2 times daily     09/16/18 0704    predniSONE (DELTASONE) 10 MG tablet  Daily     09/16/18 9983           Follow Up: Lanae Boast, FNP [place] [address] [phone]  Schedule an appointment as soon as possible for a visit  in 3-5 days to reassess for asthma and to have your potassium rechecked      This chart was dictated using voice recognition software.  Despite best efforts to proofread,  errors can occur which can change the documentation meaning.   Fatima Blank, MD 09/16/18 [phone]

## 2018-09-16 NOTE — Discharge Instructions (Addendum)
Take 4 puffs of your rescue inhaler every 4 hours for the rest of today.  After that use 2 to 4 puffs every 4-6 hours as needed.

## 2018-09-16 NOTE — ED Notes (Signed)
Patient ambulated 100%RA

## 2018-10-08 ENCOUNTER — Other Ambulatory Visit: Payer: Self-pay

## 2018-10-08 ENCOUNTER — Ambulatory Visit (INDEPENDENT_AMBULATORY_CARE_PROVIDER_SITE_OTHER): Payer: Self-pay | Admitting: Family Medicine

## 2018-10-08 ENCOUNTER — Encounter: Payer: Self-pay | Admitting: Family Medicine

## 2018-10-08 VITALS — BP 116/85 | HR 66 | Resp 12 | Ht 62.0 in | Wt 145.0 lb

## 2018-10-08 DIAGNOSIS — E876 Hypokalemia: Secondary | ICD-10-CM

## 2018-10-08 NOTE — Patient Instructions (Signed)

## 2018-10-08 NOTE — Progress Notes (Signed)
  Patient [facility]   Progress Note: Sick Visit Provider: Lanae Boast, FNP  SUBJECTIVE:   Sherri Shaw is a 38 y.o. female who  has a past medical history of Anemia, Asthma, Bipolar disorder ([place]), Chronic bronchitis ([place]), Exertional shortness of breath, History of blood transfusion (04/07/2013), Microcytic anemia, and Schizophrenia ([place]).. Patient presents today for ER Follow up (Taken all potassium pills, asthma is better)  Patient seen in the ED on 09/16/2018 for asthma exacerbation. Noted to have low potassium. Patient given medication and reports completion. Patient continues to have a cough with chest congestion today. However, she states that her asthma has improved. She has a nebulizer at home and does not want treatment today.  Review of Systems  Constitutional: Negative.   HENT: Negative.   Eyes: Negative.   Respiratory: Positive for wheezing.   Cardiovascular: Negative.   Gastrointestinal: Negative.   Genitourinary: Negative.   Musculoskeletal: Negative.   Skin: Negative.   Neurological: Negative.   Psychiatric/Behavioral: Negative.      OBJECTIVE: BP 116/85 (BP Location: Left Arm, Patient Position: Sitting)   Pulse 66   Resp 12   Ht 5\' 2"  (1.575 m)   Wt 145 lb (65.8 kg)   LMP 05/25/2013   SpO2 99%   BMI 26.52 kg/m   Wt Readings from Last 3 Encounters:  10/08/18 145 lb (65.8 kg)  09/15/18 150 lb (68 kg)  07/14/18 154 lb (69.9 kg)     Physical Exam Vitals signs and nursing note reviewed.  Constitutional:      General: She is not in acute distress.    Appearance: She is well-developed.  HENT:     Head: Normocephalic and atraumatic.  Eyes:     Conjunctiva/sclera: Conjunctivae normal.     Pupils: Pupils are equal, round, and reactive to light.  Neck:     Musculoskeletal: Normal range of motion and neck supple.     Thyroid: No thyromegaly.     Vascular: No JVD.  Cardiovascular:     Rate and Rhythm: Normal rate and  regular rhythm.     Heart sounds: Normal heart sounds. No murmur. No friction rub. No gallop.   Pulmonary:     Effort: Pulmonary effort is normal. No respiratory distress.     Breath sounds: Wheezing present. No rales.  Chest:     Chest wall: No tenderness.  Lymphadenopathy:     Cervical: No cervical adenopathy.  Neurological:     Mental Status: She is alert and oriented to person, place, and time.  Psychiatric:        Mood and Affect: Mood normal.        Behavior: Behavior normal.        Thought Content: Thought content normal.        Judgment: Judgment normal.     ASSESSMENT/PLAN:   1. Hypokalemia Pending labs. Will adjust medications accordingly.    - Basic Metabolic Panel        The patient was given clear instructions to go to ER or return to medical center if symptoms do not improve, worsen or new problems develop. The patient verbalized understanding and agreed with plan of care.   [name], FNP-BC Patient [facility] [address] [place], [place] [address] [phone]     This note has been created with Dragon speech recognition software and smart phrase technology. Any transcriptional errors are unintentional.

## 2018-10-09 LAB — BASIC METABOLIC PANEL
BUN/Creatinine Ratio: 7 — ABNORMAL LOW (ref 9–23)
BUN: 6 mg/dL (ref 6–24)
CO2: 23 mmol/L (ref 20–29)
Calcium: 8.8 mg/dL (ref 8.7–10.2)
Chloride: 104 mmol/L (ref 96–106)
Creatinine, Ser: 0.87 mg/dL (ref 0.57–1.00)
GFR calc Af Amer: 96 mL/min/{1.73_m2} (ref >59)
GFR calc non Af Amer: 84 mL/min/{1.73_m2} (ref >59)
Glucose: 72 mg/dL (ref 65–99)
Potassium: 3.5 mmol/L (ref 3.5–5.2)
Sodium: 140 mmol/L (ref 134–144)

## 2018-10-13 ENCOUNTER — Other Ambulatory Visit: Payer: Self-pay

## 2018-10-13 ENCOUNTER — Encounter (HOSPITAL_COMMUNITY): Payer: Self-pay | Admitting: Emergency Medicine

## 2018-10-13 ENCOUNTER — Inpatient Hospital Stay (HOSPITAL_COMMUNITY)
Admission: EM | Admit: 2018-10-13 | Discharge: 2018-10-15 | DRG: 202 | Disposition: A | Discharge status: Home / Self Care | Payer: Self-pay | Attending: Internal Medicine | Admitting: Internal Medicine

## 2018-10-13 ENCOUNTER — Emergency Department (HOSPITAL_COMMUNITY): Payer: Self-pay

## 2018-10-13 DIAGNOSIS — F259 Schizoaffective disorder, unspecified: Secondary | ICD-10-CM | POA: Diagnosis present

## 2018-10-13 DIAGNOSIS — E876 Hypokalemia: Secondary | ICD-10-CM | POA: Diagnosis present

## 2018-10-13 DIAGNOSIS — J4530 Mild persistent asthma, uncomplicated: Secondary | ICD-10-CM

## 2018-10-13 DIAGNOSIS — J9601 Acute respiratory failure with hypoxia: Secondary | ICD-10-CM | POA: Diagnosis present

## 2018-10-13 DIAGNOSIS — F319 Bipolar disorder, unspecified: Secondary | ICD-10-CM | POA: Diagnosis present

## 2018-10-13 DIAGNOSIS — J45901 Unspecified asthma with (acute) exacerbation: Secondary | ICD-10-CM | POA: Diagnosis present

## 2018-10-13 DIAGNOSIS — J42 Unspecified chronic bronchitis: Secondary | ICD-10-CM | POA: Diagnosis present

## 2018-10-13 DIAGNOSIS — J4541 Moderate persistent asthma with (acute) exacerbation: Principal | ICD-10-CM | POA: Diagnosis present

## 2018-10-13 DIAGNOSIS — Z79899 Other long term (current) drug therapy: Secondary | ICD-10-CM

## 2018-10-13 DIAGNOSIS — Z825 Family history of asthma and other chronic lower respiratory diseases: Secondary | ICD-10-CM

## 2018-10-13 DIAGNOSIS — F250 Schizoaffective disorder, bipolar type: Secondary | ICD-10-CM

## 2018-10-13 LAB — BASIC METABOLIC PANEL
Anion gap: 9 (ref 5–15)
BUN: 7 mg/dL (ref 6–20)
CALCIUM: 9 mg/dL (ref 8.9–10.3)
CO2: 25 mmol/L (ref 22–32)
Chloride: 102 mmol/L (ref 98–111)
Creatinine, Ser: 0.75 mg/dL (ref 0.44–1.00)
GFR calc Af Amer: >60 mL/min (ref >60)
GFR calc non Af Amer: >60 mL/min (ref >60)
Glucose, Bld: 94 mg/dL (ref 70–99)
Potassium: 3.5 mmol/L (ref 3.5–5.1)
SODIUM: 136 mmol/L (ref 135–145)

## 2018-10-13 LAB — CBC WITH DIFFERENTIAL/PLATELET
Abs Immature Granulocytes: 0.01 10*3/uL (ref 0.00–0.07)
Basophils Absolute: 0.1 10*3/uL (ref 0.0–0.1)
Basophils Relative: 1 %
Eosinophils Absolute: 0.3 10*3/uL (ref 0.0–0.5)
Eosinophils Relative: 3 %
HCT: 45.4 % (ref 36.0–46.0)
Hemoglobin: 13.9 g/dL (ref 12.0–15.0)
Immature Granulocytes: 0 %
Lymphocytes Relative: 32 %
Lymphs Abs: 2.5 10*3/uL (ref 0.7–4.0)
MCH: 27.5 pg (ref 26.0–34.0)
MCHC: 30.6 g/dL (ref 30.0–36.0)
MCV: 89.7 fL (ref 80.0–100.0)
Monocytes Absolute: 0.5 10*3/uL (ref 0.1–1.0)
Monocytes Relative: 7 %
Neutro Abs: 4.6 10*3/uL (ref 1.7–7.7)
Neutrophils Relative %: 57 %
Platelets: 295 10*3/uL (ref 150–400)
RBC: 5.06 MIL/uL (ref 3.87–5.11)
RDW: 13.2 % (ref 11.5–15.5)
WBC: 8 10*3/uL (ref 4.0–10.5)
nRBC: 0 % (ref 0.0–0.2)

## 2018-10-13 LAB — PHOSPHORUS: Phosphorus: 3.4 mg/dL (ref 2.5–4.6)

## 2018-10-13 LAB — MAGNESIUM: MAGNESIUM: 2 mg/dL (ref 1.7–2.4)

## 2018-10-13 MED ORDER — ALBUTEROL SULFATE (2.5 MG/3ML) 0.083% IN NEBU
2.5000 mg | INHALATION_SOLUTION | Freq: Four times a day (QID) | RESPIRATORY_TRACT | Status: DC
  Administered 2018-10-14 (×2): 2.5 mg via RESPIRATORY_TRACT
  Filled 2018-10-13 (×2): qty 3

## 2018-10-13 MED ORDER — ENOXAPARIN SODIUM 40 MG/0.4ML ~~LOC~~ SOLN
40.0000 mg | SUBCUTANEOUS | Status: DC
  Administered 2018-10-14: 40 mg via SUBCUTANEOUS
  Filled 2018-10-13: qty 0.4

## 2018-10-13 MED ORDER — POTASSIUM CHLORIDE IN NACL 20-0.45 MEQ/L-% IV SOLN: INTRAVENOUS | Status: DC

## 2018-10-13 MED ORDER — ALBUTEROL SULFATE (2.5 MG/3ML) 0.083% IN NEBU
5.0000 mg | INHALATION_SOLUTION | Freq: Once | RESPIRATORY_TRACT | Status: AC
  Administered 2018-10-13: 5 mg via RESPIRATORY_TRACT
  Filled 2018-10-13: qty 6

## 2018-10-13 MED ORDER — ACETAMINOPHEN 650 MG RE SUPP: 650.0000 mg | Freq: Four times a day (QID) | RECTAL | Status: DC | PRN

## 2018-10-13 MED ORDER — PREDNISONE 20 MG PO TABS
40.0000 mg | ORAL_TABLET | Freq: Every day | ORAL | Status: DC
  Administered 2018-10-15: 40 mg via ORAL
  Filled 2018-10-13: qty 2

## 2018-10-13 MED ORDER — ENOXAPARIN SODIUM 40 MG/0.4ML ~~LOC~~ SOLN: 40.0000 mg | SUBCUTANEOUS | Status: DC

## 2018-10-13 MED ORDER — METHYLPREDNISOLONE SODIUM SUCC 40 MG IJ SOLR
40.0000 mg | Freq: Four times a day (QID) | INTRAMUSCULAR | Status: AC
  Administered 2018-10-14 (×4): 40 mg via INTRAVENOUS
  Filled 2018-10-13 (×4): qty 1

## 2018-10-13 MED ORDER — MAGNESIUM SULFATE 2 GM/50ML IV SOLN
2.0000 g | Freq: Once | INTRAVENOUS | Status: AC
  Administered 2018-10-14: 2 g via INTRAVENOUS
  Filled 2018-10-13 (×2): qty 50

## 2018-10-13 MED ORDER — METHYLPREDNISOLONE SODIUM SUCC 125 MG IJ SOLR
125.0000 mg | Freq: Once | INTRAMUSCULAR | Status: AC
  Administered 2018-10-13: 125 mg via INTRAVENOUS
  Filled 2018-10-13: qty 2

## 2018-10-13 MED ORDER — IPRATROPIUM BROMIDE 0.02 % IN SOLN
0.5000 mg | Freq: Four times a day (QID) | RESPIRATORY_TRACT | Status: DC
  Administered 2018-10-14 (×2): 0.5 mg via RESPIRATORY_TRACT
  Filled 2018-10-13 (×2): qty 2.5

## 2018-10-13 MED ORDER — ALBUTEROL (5 MG/ML) CONTINUOUS INHALATION SOLN
10.0000 mg/h | INHALATION_SOLUTION | Freq: Once | RESPIRATORY_TRACT | Status: AC
  Administered 2018-10-13: 10 mg/h via RESPIRATORY_TRACT
  Filled 2018-10-13: qty 20

## 2018-10-13 MED ORDER — POTASSIUM CHLORIDE IN NACL 20-0.45 MEQ/L-% IV SOLN
INTRAVENOUS | Status: AC
  Administered 2018-10-14: 03:00:00 via INTRAVENOUS
  Filled 2018-10-13: qty 1000

## 2018-10-13 MED ORDER — ACETAMINOPHEN 325 MG PO TABS: 650.0000 mg | ORAL_TABLET | Freq: Four times a day (QID) | ORAL | Status: DC | PRN

## 2018-10-13 MED ORDER — IPRATROPIUM-ALBUTEROL 0.5-2.5 (3) MG/3ML IN SOLN
3.0000 mL | Freq: Once | RESPIRATORY_TRACT | Status: AC
  Administered 2018-10-13: 3 mL via RESPIRATORY_TRACT
  Filled 2018-10-13: qty 3

## 2018-10-13 MED ORDER — ALBUTEROL SULFATE (2.5 MG/3ML) 0.083% IN NEBU: 2.5000 mg | INHALATION_SOLUTION | RESPIRATORY_TRACT | Status: DC | PRN

## 2018-10-13 MED ORDER — POTASSIUM CHLORIDE CRYS ER 20 MEQ PO TBCR
40.0000 meq | EXTENDED_RELEASE_TABLET | Freq: Once | ORAL | Status: AC
  Administered 2018-10-14: 40 meq via ORAL
  Filled 2018-10-13: qty 2

## 2018-10-13 NOTE — ED Notes (Addendum)
RN aware of O2 at 88%, 2L O2 provided via [place],

## 2018-10-13 NOTE — ED Triage Notes (Signed)
Pt c/o shortness of breath x weeks. Hx asthma, has been seen by PCP, started on new inhalers. Audible wheezing.

## 2018-10-13 NOTE — ED Provider Notes (Signed)
[facility] EMERGENCY DEPARTMENT Provider Note   CSN: [id number] Arrival date & time: 10/13/18  1430    History   Chief Complaint Chief Complaint  Patient presents with  . Asthma    HPI Sherri Shaw is a 38 y.o. female.     HPI Patient presents with shortness of breath over the last few days.  History of asthma.  No relief with inhaler at home.  States that she has had a cough with some yellowish sputum.  No definite sick contacts.  States she has had some fevers.  Room air sats of 88%.  Not on oxygen at home. Past Medical History:  Diagnosis Date  . Anemia   . Asthma   . Bipolar disorder [facility])    sister denies this hx on 04/07/2013  . Chronic bronchitis ([place])    "I get it q yr" (04/07/2013)  . Exertional shortness of breath    ? due to anemia  . History of blood transfusion 04/07/2013   "got my first transfusion today; got 4 units" (04/07/2013)  . Microcytic anemia    Archie Endo 04/07/2013  . Schizophrenia ([place])    "don't really know the type"/sister (04/07/2013)    Patient Active Problem List   Diagnosis Date Noted  . Sepsis ([place]) 08/01/2017  . Hypokalemia 08/01/2017  . Acute respiratory failure with hypoxia ([place]) 08/01/2017  . Anemia   . Schizophrenia ([place])   . Asthma exacerbation 09/03/2014  . Schizo-affective schizophrenia ([place]) 04/10/2013  . Microcytic anemia 04/07/2013  . Uterine mass 04/07/2013  . Trichomonas 04/07/2013  . Bipolar disorder ([place]) 04/07/2013  . Bilateral lower extremity edema 04/07/2013    Past Surgical History:  Procedure Laterality Date  . ABDOMINAL HYSTERECTOMY N/A 06/01/2013   Procedure: HYSTERECTOMY ABDOMINAL;  Surgeon: Lavonia Drafts, MD;  Location: [facility];  Service: Gynecology;  Laterality: N/A;  . BILATERAL SALPINGECTOMY Bilateral 06/01/2013   Procedure: BILATERAL SALPINGECTOMY;  Surgeon: Lavonia Drafts, MD;  Location: [facility];  Service: Gynecology;  Laterality: Bilateral;  . CYSTOSCOPY W/ URETERAL STENT  PLACEMENT Bilateral 06/01/2013   Procedure: CYSTOSCOPY WITH BILATERAL STENT REPLACEMENT;  Surgeon: Irine Seal, MD;  Location: [facility];  Service: Urology;  Laterality: Bilateral;  . HYSTERECTOMY ABDOMINAL WITH SALPINGECTOMY    . NO PAST SURGERIES       OB History    Gravida  0   Para  0   Term  0   Preterm  0   AB  0   Living  0     SAB  0   TAB  0   Ectopic  0   Multiple  0   Live Births               Home Medications    Prior to Admission medications   Medication Sig Start Date End Date Taking? Authorizing Provider  albuterol (PROVENTIL) (2.5 MG/3ML) 0.083% nebulizer solution Take 3 mLs (2.5 mg total) by nebulization every 6 (six) hours as needed for wheezing or shortness of breath. 09/16/18  Yes Cardama, Grayce Sessions, MD  albuterol (PROVENTIL HFA;VENTOLIN HFA) 108 (90 Base) MCG/ACT inhaler Inhale 2 puffs into the lungs every 4 (four) hours as needed for wheezing or shortness of breath. Patient not taking: Reported on 10/13/2018 07/14/18   Lanae Boast, FNP  ipratropium (ATROVENT) 0.02 % nebulizer solution Take 2.5 mLs (0.5 mg total) by nebulization every 6 (six) hours as needed for wheezing or shortness of breath. Patient not taking: Reported on 10/13/2018 09/16/18  Fatima Blank, MD  montelukast (SINGULAIR) 10 MG tablet Take 1 tablet (10 mg total) by mouth at bedtime. Patient not taking: Reported on 10/13/2018 07/14/18   Lanae Boast, FNP    Family History Family History  Problem Relation Age of Onset  . Hypertension Mother   . Diabetes Mother   . Asthma Mother   . Stroke Mother   . Cancer Mother   . Asthma Brother   . Heart disease Maternal Aunt     Social History Social History   Tobacco Use  . Smoking status: Never Smoker  . Smokeless tobacco: Never Used  Substance Use Topics  . Alcohol use: No  . Drug use: No     Allergies   Patient has no known allergies.   Review of Systems Review of Systems  Constitutional: Negative for  appetite change.  HENT: Positive for congestion.   Respiratory: Positive for cough, shortness of breath and wheezing.   Cardiovascular: Negative for chest pain.  Gastrointestinal: Negative for abdominal distention.  Genitourinary: Negative for flank pain.  Musculoskeletal: Negative for back pain.  Skin: Negative for rash.  Neurological: Negative for weakness.  Psychiatric/Behavioral: Negative for confusion.     Physical Exam Updated Vital Signs BP 133/64 (BP Location: Right Arm)   Pulse 70   Temp 98.7 F (37.1 C) (Oral)   Resp 16   LMP 05/25/2013   SpO2 96%   Physical Exam Vitals signs and nursing note reviewed.  HENT:     Mouth/Throat:     Pharynx: No oropharyngeal exudate.  Eyes:     Extraocular Movements: Extraocular movements intact.  Neck:     Musculoskeletal: Neck supple.  Cardiovascular:     Rate and Rhythm: Normal rate and regular rhythm.  Pulmonary:     Comments: Dyspnea with pursed lip breathing and inspiratory and expiratory wheezes. Abdominal:     Tenderness: There is no abdominal tenderness.  Musculoskeletal:     Right lower leg: No edema.     Left lower leg: No edema.  Skin:    General: Skin is warm.     Capillary Refill: Capillary refill takes less than 2 seconds.  Neurological:     Mental Status: She is alert. Mental status is at baseline.      ED Treatments / Results  Labs (all labs ordered are listed, but only abnormal results are displayed) Labs Reviewed  BASIC METABOLIC PANEL  CBC WITH DIFFERENTIAL/PLATELET  MAGNESIUM  PHOSPHORUS  HIV ANTIBODY (ROUTINE TESTING W REFLEX)  COMPREHENSIVE METABOLIC PANEL    EKG EKG Interpretation  Date/Time:  Monday October 13 2018 15:09:49 EDT Ventricular Rate:  81 PR Interval:  142 QRS Duration: 72 QT Interval:  378 QTC Calculation: 439 R Axis:   23 Text Interpretation:  Sinus rhythm with sinus arrhythmia with occasional Premature ventricular complexes Low voltage QRS Septal infarct , age  undetermined Abnormal ECG Confirmed by Davonna Belling [phone]) on 10/13/2018 10:20:47 PM   Radiology Dg Chest Portable 1 View  Result Date: 10/13/2018 CLINICAL DATA:  Shortness of breath EXAM: PORTABLE CHEST 1 VIEW COMPARISON:  09/16/2018 FINDINGS: The heart size and mediastinal contours are within normal limits. Both lungs are clear. The visualized skeletal structures are unremarkable. IMPRESSION: No active disease. Electronically Signed   By: Lucienne Capers M.D.   On: 10/13/2018 22:01    Procedures Procedures (including critical care time)  Medications Ordered in ED Medications  potassium chloride SA (K-DUR,KLOR-CON) CR tablet 40 mEq (has no administration in time range)  magnesium sulfate IVPB 2 g 50 mL (has no administration in time range)  0.45 % NaCl with KCl 20 mEq / L infusion (has no administration in time range)  acetaminophen (TYLENOL) tablet 650 mg (has no administration in time range)    Or  acetaminophen (TYLENOL) suppository 650 mg (has no administration in time range)  ipratropium (ATROVENT) nebulizer solution 0.5 mg (has no administration in time range)  albuterol (PROVENTIL) (2.5 MG/3ML) 0.083% nebulizer solution 2.5 mg (has no administration in time range)  albuterol (PROVENTIL) (2.5 MG/3ML) 0.083% nebulizer solution 2.5 mg (has no administration in time range)  enoxaparin (LOVENOX) injection 40 mg (has no administration in time range)  methylPREDNISolone sodium succinate (SOLU-MEDROL) 40 mg/mL injection 40 mg (has no administration in time range)    Followed by  predniSONE (DELTASONE) tablet 40 mg (has no administration in time range)  albuterol (PROVENTIL) (2.5 MG/3ML) 0.083% nebulizer solution 5 mg (5 mg Nebulization Given 10/13/18 1515)  ipratropium-albuterol (DUONEB) 0.5-2.5 (3) MG/3ML nebulizer solution 3 mL (3 mLs Nebulization Given 10/13/18 2126)  albuterol (PROVENTIL,VENTOLIN) solution continuous neb (10 mg/hr Nebulization Given 10/13/18 2140)  methylPREDNISolone  sodium succinate (SOLU-MEDROL) 125 mg/2 mL injection 125 mg (125 mg Intravenous Given 10/13/18 2202)     Initial Impression / Assessment and Plan / ED Course  I have reviewed the triage vital signs and the nursing notes.  Pertinent labs & imaging results that were available during my care of the patient were reviewed by me and considered in my medical decision making (see chart for details).        Patient shortness of breath.  Inspiratory and expiratory wheezes.  Hypoxic on room air.  After hour-long nebulizer still tight wheezes.  Will admit to hospitalist. Final Clinical Impressions(s) / ED Diagnoses   Final diagnoses:  Moderate persistent asthma with exacerbation    ED Discharge Orders    None       Davonna Belling, MD 10/13/18 2359

## 2018-10-13 NOTE — H&P (Signed)
History and Physical    Sherri Shaw WGN:[id number] DOB: 05-05-1981 DOA: 10/13/2018  PCP: Lanae Boast, FNP   Patient coming from: Home.  I have personally briefly reviewed patient's old medical records in [facility]  Chief Complaint: Shortness of breath.  HPI: Sherri Shaw is a 38 y.o. female with medical history significant of anemia, asthma, history of bipolar disorder, schizoaffective schizophrenia who is coming to the emergency department due to progressively worse dyspnea, nonproductive cough, and wheezing, that has not responded to new inhalers prescribed by her PCP.  She had audible wheezing when she first arrived to the emergency department.  She denies fever, chills, but feels fatigued.  No rhinorrhea, sore throat, productive cough, hemoptysis, chest pain, palpitations, dizziness, diaphoresis, PND, orthopnea or pitting edema of the lower extremities.  No abdominal pain, nausea or emesis, diarrhea or constipation, melena or hematochezia.  No dysuria, frequency or hematuria.  ED Course: Initial vital signs temperature 99.2 F, pulse 87, respirations 26, blood pressure 153/109 mmHg and O2 sat 88% on room air.  Her CBC was normal.  BMP, magnesium and phosphorus were within expected limits.  Her chest radiograph did not show any active disease.  Review of Systems: As per HPI otherwise 10 point review of systems negative.   Past Medical History:  Diagnosis Date  . Anemia   . Asthma   . Bipolar disorder [facility])    sister denies this hx on 04/07/2013  . Chronic bronchitis ([place])    "I get it q yr" (04/07/2013)  . Exertional shortness of breath    ? due to anemia  . History of blood transfusion 04/07/2013   "got my first transfusion today; got 4 units" (04/07/2013)  . Microcytic anemia    Archie Endo 04/07/2013  . Schizophrenia ([place])    "don't really know the type"/sister (04/07/2013)    Past Surgical History:  Procedure Laterality Date  . ABDOMINAL HYSTERECTOMY N/A 06/01/2013   Procedure: HYSTERECTOMY ABDOMINAL;  Surgeon: Lavonia Drafts, MD;  Location: [facility];  Service: Gynecology;  Laterality: N/A;  . BILATERAL SALPINGECTOMY Bilateral 06/01/2013   Procedure: BILATERAL SALPINGECTOMY;  Surgeon: Lavonia Drafts, MD;  Location: [facility];  Service: Gynecology;  Laterality: Bilateral;  . CYSTOSCOPY W/ URETERAL STENT PLACEMENT Bilateral 06/01/2013   Procedure: CYSTOSCOPY WITH BILATERAL STENT REPLACEMENT;  Surgeon: Irine Seal, MD;  Location: [facility];  Service: Urology;  Laterality: Bilateral;  . HYSTERECTOMY ABDOMINAL WITH SALPINGECTOMY    . NO PAST SURGERIES       reports that she has never smoked. She has never used smokeless tobacco. She reports that she does not drink alcohol or use drugs.  No Known Allergies  Family History  Problem Relation Age of Onset  . Hypertension Mother   . Diabetes Mother   . Asthma Mother   . Stroke Mother   . Cancer Mother   . Asthma Brother   . Heart disease Maternal Aunt    Prior to Admission medications   Medication Sig Start Date End Date Taking? Authorizing Provider  albuterol (PROVENTIL) (2.5 MG/3ML) 0.083% nebulizer solution Take 3 mLs (2.5 mg total) by nebulization every 6 (six) hours as needed for wheezing or shortness of breath. 09/16/18  Yes Cardama, Grayce Sessions, MD  albuterol (PROVENTIL HFA;VENTOLIN HFA) 108 (90 Base) MCG/ACT inhaler Inhale 2 puffs into the lungs every 4 (four) hours as needed for wheezing or shortness of breath. Patient not taking: Reported on 10/13/2018 07/14/18   Lanae Boast, FNP  ipratropium (ATROVENT) 0.02 % nebulizer solution Take  2.5 mLs (0.5 mg total) by nebulization every 6 (six) hours as needed for wheezing or shortness of breath. Patient not taking: Reported on 10/13/2018 09/16/18   Fatima Blank, MD  montelukast (SINGULAIR) 10 MG tablet Take 1 tablet (10 mg total) by mouth at bedtime. Patient not taking: Reported on 10/13/2018 07/14/18   Lanae Boast, FNP    Physical  Exam: Vitals:   10/13/18 1503 10/13/18 1834 10/13/18 2141  BP: (!) 153/109 133/64   Pulse: 87 70   Resp: (!) 26 16   Temp: 99.2 F (37.3 C) 98.7 F (37.1 C)   TempSrc: Oral Oral   SpO2: (!) 88% 100% 96%    Constitutional: NAD, calm, comfortable Eyes: PERRL, sclerae, lids and conjunctivae are mildly injected. ENMT: Mucous membranes are mildly dry.  Posterior pharynx clear of any exudate or lesions. Neck: normal, supple, no masses, no thyromegaly Respiratory: Decreased breath sounds with wheezing bilaterally, no crackles. Normal respiratory effort. No accessory muscle use.  Cardiovascular: Regular rate and rhythm, no murmurs / rubs / gallops. No extremity edema. 2+ pedal pulses. No carotid bruits.  Abdomen: no tenderness, no masses palpated. No hepatosplenomegaly. Bowel sounds positive.  Musculoskeletal: no clubbing / cyanosis.  Good ROM, no contractures. Normal muscle tone.  Skin: no rashes, lesions, ulcers. No induration Neurologic: CN 2-12 grossly intact. Sensation intact, DTR normal. Strength 5/5 in all 4.  Psychiatric: Normal judgment and insight. Alert and oriented x 3. Normal mood.   Labs on Admission: I have personally reviewed following labs and imaging studies  CBC: Recent Labs  Lab 10/13/18 2136  WBC 8.0  NEUTROABS 4.6  HGB 13.9  HCT 45.4  MCV 89.7  PLT 315   Basic Metabolic Panel: Recent Labs  Lab 10/08/18 0944 10/13/18 2136  NA 140 136  K 3.5 3.5  CL 104 102  CO2 23 25  GLUCOSE 72 94  BUN 6 7  CREATININE 0.87 0.75  CALCIUM 8.8 9.0   GFR: Estimated Creatinine Clearance: 83.2 mL/min (by C-G formula based on SCr of 0.75 mg/dL). Liver Function Tests: No results for input(s): AST, ALT, ALKPHOS, BILITOT, PROT, ALBUMIN in the last 168 hours. No results for input(s): LIPASE, AMYLASE in the last 168 hours. No results for input(s): AMMONIA in the last 168 hours. Coagulation Profile: No results for input(s): INR, PROTIME in the last 168 hours. Cardiac  Enzymes: No results for input(s): CKTOTAL, CKMB, CKMBINDEX, TROPONINI in the last 168 hours. BNP (last 3 results) No results for input(s): PROBNP in the last 8760 hours. HbA1C: No results for input(s): HGBA1C in the last 72 hours. CBG: No results for input(s): GLUCAP in the last 168 hours. Lipid Profile: No results for input(s): CHOL, HDL, LDLCALC, TRIG, CHOLHDL, LDLDIRECT in the last 72 hours. Thyroid Function Tests: No results for input(s): TSH, T4TOTAL, FREET4, T3FREE, THYROIDAB in the last 72 hours. Anemia Panel: No results for input(s): VITAMINB12, FOLATE, FERRITIN, TIBC, IRON, RETICCTPCT in the last 72 hours. Urine analysis:    Component Value Date/Time   COLORURINE YELLOW 04/06/2013 2155   APPEARANCEUR CLOUDY (A) 04/06/2013 2155   LABSPEC 1.020 04/06/2013 2155   PHURINE 7.5 04/06/2013 2155   GLUCOSEU NEGATIVE 04/06/2013 2155   HGBUR NEGATIVE 04/06/2013 2155   BILIRUBINUR negative 12/02/2017 1343   KETONESUR negative 12/02/2017 1343   KETONESUR NEGATIVE 04/06/2013 2155   PROTEINUR negative 12/02/2017 1343   PROTEINUR NEGATIVE 04/06/2013 2155   UROBILINOGEN >=8.0 (A) 12/02/2017 1343   UROBILINOGEN 1.0 04/06/2013 2155   NITRITE Negative 12/02/2017  [place] 04/06/2013 2155   LEUKOCYTESUR Negative 12/02/2017 1343    Radiological Exams on Admission: Dg Chest Portable 1 View  Result Date: 10/13/2018 CLINICAL DATA:  Shortness of breath EXAM: PORTABLE CHEST 1 VIEW COMPARISON:  09/16/2018 FINDINGS: The heart size and mediastinal contours are within normal limits. Both lungs are clear. The visualized skeletal structures are unremarkable. IMPRESSION: No active disease. Electronically Signed   By: Lucienne Capers M.D.   On: 10/13/2018 22:01    EKG: Independently reviewed.  Vent. rate 81 BPM PR interval 142 ms QRS duration 72 ms QT/QTc 378/439 ms P-R-T axes 69 23 34 Sinus rhythm with sinus arrhythmia with occasional Premature ventricular complexes Low voltage  QRS Septal infarct , age undetermined Abnormal ECG  Assessment/Plan Principal Problem:   Asthma exacerbation Observation/telemetry. Continue supplemental oxygen. Continue DuoNeb every 6 hours. Albuterol 2.5 mg via neb every 4 hours PRN. Continue Solu-Medrol. Switch to oral prednisone in 24 to 48 hours.  Active Problems:   Abormal EKG. Check echocardiogram.    Bipolar disorder ([place])   Schizo-affective schizophrenia ([employer]) Not on medications. Seems to be effectively proper. Denies depressive, suicidal or homicidal thoughts.   DVT prophylaxis: Lovenox SQ. Code Status: Full code. Family Communication: Disposition Plan: 24 to 48 hours duration for asthma exacerbation treatment Consults called: Admission status: Observation/telemetry.   Reubin Milan MD [facility]  10/13/2018, 11:06 PM

## 2018-10-13 NOTE — ED Notes (Signed)
Patient arrived in room 49

## 2018-10-14 ENCOUNTER — Observation Stay (HOSPITAL_BASED_OUTPATIENT_CLINIC_OR_DEPARTMENT_OTHER): Payer: Self-pay

## 2018-10-14 DIAGNOSIS — J45901 Unspecified asthma with (acute) exacerbation: Secondary | ICD-10-CM | POA: Diagnosis present

## 2018-10-14 DIAGNOSIS — R9431 Abnormal electrocardiogram [ECG] [EKG]: Secondary | ICD-10-CM

## 2018-10-14 DIAGNOSIS — J4551 Severe persistent asthma with (acute) exacerbation: Secondary | ICD-10-CM

## 2018-10-14 DIAGNOSIS — F3170 Bipolar disorder, currently in remission, most recent episode unspecified: Secondary | ICD-10-CM

## 2018-10-14 LAB — ECHOCARDIOGRAM COMPLETE
Height: 62 in
Weight: 2285.73 oz

## 2018-10-14 LAB — COMPREHENSIVE METABOLIC PANEL
ALT: 14 U/L (ref 0–44)
ANION GAP: 11 (ref 5–15)
AST: 23 U/L (ref 15–41)
Albumin: 3.5 g/dL (ref 3.5–5.0)
Alkaline Phosphatase: 45 U/L (ref 38–126)
BUN: 5 mg/dL — ABNORMAL LOW (ref 6–20)
CO2: 24 mmol/L (ref 22–32)
Calcium: 8.7 mg/dL — ABNORMAL LOW (ref 8.9–10.3)
Chloride: 101 mmol/L (ref 98–111)
Creatinine, Ser: 0.95 mg/dL (ref 0.44–1.00)
GFR calc Af Amer: >60 mL/min (ref >60)
GFR calc non Af Amer: >60 mL/min (ref >60)
Glucose, Bld: 215 mg/dL — ABNORMAL HIGH (ref 70–99)
Potassium: 3 mmol/L — ABNORMAL LOW (ref 3.5–5.1)
SODIUM: 136 mmol/L (ref 135–145)
Total Bilirubin: 0.4 mg/dL (ref 0.3–1.2)
Total Protein: 6.7 g/dL (ref 6.5–8.1)

## 2018-10-14 LAB — HIV ANTIBODY (ROUTINE TESTING W REFLEX): HIV SCREEN 4TH GENERATION: NONREACTIVE

## 2018-10-14 MED ORDER — IPRATROPIUM BROMIDE 0.02 % IN SOLN: 0.5000 mg | Freq: Four times a day (QID) | RESPIRATORY_TRACT | Status: DC | PRN

## 2018-10-14 MED ORDER — MONTELUKAST SODIUM 10 MG PO TABS
10.0000 mg | ORAL_TABLET | Freq: Every day | ORAL | Status: DC
  Administered 2018-10-14: 10 mg via ORAL
  Filled 2018-10-14: qty 1

## 2018-10-14 MED ORDER — POTASSIUM CHLORIDE 20 MEQ/15ML (10%) PO SOLN
ORAL | Status: AC
  Administered 2018-10-14: 03:00:00
  Filled 2018-10-14: qty 15

## 2018-10-14 NOTE — Progress Notes (Signed)
Nutrition Consult/Brief Note  RD consulted via Inpatient COPD Exacerbation Protocol.  Wt Readings from Last 15 Encounters:  10/14/18 64.8 kg  10/08/18 65.8 kg  09/15/18 68 kg  07/14/18 69.9 kg  07/01/18 63.5 kg  05/26/18 72.1 kg  05/08/18 69.5 kg  12/02/17 73.9 kg  09/03/17 79.8 kg  08/15/17 79.8 kg  08/02/17 81.1 kg  06/16/17 70.3 kg  10/13/15 59.4 kg  10/07/15 52.6 kg  06/30/15 50.4 kg   Body mass index is 26.13 kg/m. Patient meets criteria for Normal based on current BMI.   Current diet order is Heart Healthy. Pt reports a good appetite. RD helped patient order lunch for today.   Labs and medications reviewed. No nutrition interventions warranted at this time.   If nutrition issues arise, please consult RD.   Arthur Holms, RD, LDN Pager #: [phone] After-Hours Pager #: [phone]

## 2018-10-14 NOTE — Progress Notes (Signed)
OT Evaluation  PTA, pt independent with ADL and mobility. Pt currently at baseline with ability to complete ADL and functional mobility for ADL with SpO2 95 after mobility and ADL task. No SOB. Pt staets she feels "much better". No DME needs. Pt excited about possibility of working at the [employer]. Pt given information on vocational rehab if needed. OT signing off.     10/14/18 1700  OT Visit Information  Last OT Received On 10/14/18  Assistance Needed +1  History of Present Illness Sherri Shaw is a 38 y.o. female with medical history significant of anemia, asthma, history of bipolar disorder, schizoaffective schizophrenia who is coming to the emergency department due to progressively worse dyspnea, nonproductive cough, and wheezing, that has not responded to new inhalers prescribed by her PCP  Precautions  Precautions None  Home Living  Family/patient expects to be discharged to: Private residence  Living Arrangements Other relatives  Available Help at Discharge Family;Available PRN/intermittently  Type of Home House  Home Layout One level  Bathroom Shower/Tub Tub/shower unit  Corporate treasurer No  Home Equipment None  Prior Function  Level of Independence Independent  Communication  Communication No difficulties  Pain Assessment  Pain Assessment No/denies pain  Cognition  Arousal/Alertness Awake/alert  Behavior During Therapy WFL for tasks assessed/performed  Overall Cognitive Status History of cognitive impairments - at baseline  Upper Extremity Assessment  Upper Extremity Assessment Overall WFL for tasks assessed  Lower Extremity Assessment  Lower Extremity Assessment Overall WFL for tasks assessed  Cervical / Trunk Assessment  Cervical / Trunk Assessment Normal  ADL  Overall ADL's  At baseline  General ADL Comments SpO2 95 after activity. no SOB  Bed Mobility  Overal bed mobility Independent  Transfers  Overall transfer level  Independent  Balance  Overall balance assessment Independent  OT - End of Session  Activity Tolerance Patient tolerated treatment well  Patient left in bed;with call bell/phone within reach  Nurse Communication Mobility status  OT Assessment  OT Recommendation/Assessment Patient does not need any further OT services  OT Visit Diagnosis Other (comment) (poor endurance)  OT Problem List Decreased activity tolerance  AM-PAC OT "6 Clicks" Daily Activity Outcome Measure (Version 2)  Help from another person eating meals? 4  Help from another person taking care of personal grooming? 4  Help from another person toileting, which includes using toliet, bedpan, or urinal? 4  Help from another person bathing (including washing, rinsing, drying)? 4  Help from another person to put on and taking off regular upper body clothing? 4  Help from another person to put on and taking off regular lower body clothing? 4  6 Click Score 24  OT Recommendation  Follow Up Recommendations No OT follow up  OT Equipment None recommended by OT  Acute Rehab OT Goals  Patient Stated Goal to work at the [employer]  OT Goal Formulation All assessment and education complete, DC therapy  OT Time Calculation  OT Start Time (ACUTE ONLY) 1710  OT Stop Time (ACUTE ONLY) 1725  OT Time Calculation (min) 15 min  OT General Charges  $OT Visit 1 Visit  OT Evaluation  $OT Eval Low Complexity [place], OT/L   Acute OT Clinical Specialist Acute Rehabilitation Services Pager [phone] Office [phone]

## 2018-10-14 NOTE — Progress Notes (Signed)
PROGRESS NOTE    Sherri Shaw  RUE:[id number] DOB: [birth year] DOA: 10/13/2018 PCP: Mike Gip, FNP   Brief Narrative: Sherri Shaw is a 38 y.o. female with medical history significant of anemia, asthma, history of bipolar disorder, schizoaffective schizophrenia who is coming to the emergency department due to progressively worse dyspnea, nonproductive cough, and wheezing, that has not responded to new inhalers prescribed by her PCP.  She had audible wheezing when she first arrived to the emergency department.  She denies fever, chills, but feels fatigued.  No rhinorrhea, sore throat, productive cough, hemoptysis, chest pain, palpitations, dizziness, diaphoresis, PND, orthopnea or pitting edema of the lower extremities.  No abdominal pain, nausea or emesis, diarrhea or constipation, melena or hematochezia.  No dysuria, frequency or hematuria.  ED Course: Initial vital signs temperature 99.2 F, pulse 87, respirations 26, blood pressure 153/109 mmHg and O2 sat 88% on room air.  Assessment & Plan:   Principal Problem:   Asthma exacerbation Active Problems:   Bipolar disorder (HCC)   Schizo-affective schizophrenia (HCC)  ##Acute asthma exacerbation -Failed outpatient treatment -Patient continues to have bilateral diffuse wheezing -Patient was hypoxic at admission with oxygen saturations of 88% on room air -Continue with albuterol, Solu-Medrol -Mild improvement from the time of the admission  ##Acute hypoxic respiratory failure -Secondary to asthma exacerbation -Continue with oxygen through nasal cannula  ##Hypokalemia -Replace by mouth    ##Abormal EKG.  -poor R wave progression in V1, V2, V3 -echocardiogram-report pending.  ##  Bipolar disorder (HCC)   Schizo-affective schizophrenia (HCC) -no current issues and-not on any medication    DVT prophylaxis: Lovenox Code Status: Full Family Communication: No family member present.  Discussed with patient disposition Plan:  Home tomorrow   Procedures:  Echocardiogram    Subjective: Mild improvement from yesterday  Objective: Vitals:   10/14/18 0131 10/14/18 0232 10/14/18 0811 10/14/18 0817  BP:   105/65   Pulse:  70 78   Resp:  16    Temp:   97.7 F (36.5 C)   TempSrc:   Oral   SpO2:  99% 100% 100%  Weight: 64.8 kg     Height: 5\' 2"  (1.575 m)       Intake/Output Summary (Last 24 hours) at 10/14/2018 1326 Last data filed at 10/14/2018 0530 Gross per 24 hour  Intake 864.46 ml  Output -  Net 864.46 ml   Filed Weights   10/14/18 0131  Weight: 64.8 kg    Examination:  General exam: Appears calm and comfortable  Respiratory system: Bilateral wheezing. Cardiovascular system: S1 & S2 heard, RRR. No JVD, murmurs, rubs, gallops or clicks. No pedal edema. Gastrointestinal system: Abdomen is nondistended, soft and nontender. No organomegaly or masses felt. Normal bowel sounds heard. Central nervous system: Alert and oriented. No focal neurological deficits. Extremities: Symmetric 5 x 5 power. Skin: No rashes, lesions or ulcers Psychiatry: Judgement and insight appear normal. Mood & affect appropriate.     Data Reviewed: I have personally reviewed following labs and imaging studies  CBC: Recent Labs  Lab 10/13/18 2136  WBC 8.0  NEUTROABS 4.6  HGB 13.9  HCT 45.4  MCV 89.7  PLT 295   Basic Metabolic Panel: Recent Labs  Lab 10/08/18 0944 10/13/18 2136 10/14/18 0306  NA 140 136 136  K 3.5 3.5 3.0*  CL 104 102 101  CO2 23 25 24   GLUCOSE 72 94 215*  BUN 6 7 5*  CREATININE 0.87 0.75 0.95  CALCIUM 8.8 9.0 8.7*  MG  --  2.0  --   PHOS  --  3.4  --    GFR: Estimated Creatinine Clearance: 69.6 mL/min (by C-G formula based on SCr of 0.95 mg/dL). Liver Function Tests: Recent Labs  Lab 10/14/18 0306  AST 23  ALT 14  ALKPHOS 45  BILITOT 0.4  PROT 6.7  ALBUMIN 3.5   No results for input(s): LIPASE, AMYLASE in the last 168 hours. No results for input(s): AMMONIA in the last  168 hours. Coagulation Profile: No results for input(s): INR, PROTIME in the last 168 hours. Cardiac Enzymes: No results for input(s): CKTOTAL, CKMB, CKMBINDEX, TROPONINI in the last 168 hours. BNP (last 3 results) No results for input(s): PROBNP in the last 8760 hours. HbA1C: No results for input(s): HGBA1C in the last 72 hours. CBG: No results for input(s): GLUCAP in the last 168 hours. Lipid Profile: No results for input(s): CHOL, HDL, LDLCALC, TRIG, CHOLHDL, LDLDIRECT in the last 72 hours. Thyroid Function Tests: No results for input(s): TSH, T4TOTAL, FREET4, T3FREE, THYROIDAB in the last 72 hours. Anemia Panel: No results for input(s): VITAMINB12, FOLATE, FERRITIN, TIBC, IRON, RETICCTPCT in the last 72 hours. Sepsis Labs: No results for input(s): PROCALCITON, LATICACIDVEN in the last 168 hours.  No results found for this or any previous visit (from the past 240 hour(s)).       Radiology Studies: Dg Chest Portable 1 View  Result Date: 10/13/2018 CLINICAL DATA:  Shortness of breath EXAM: PORTABLE CHEST 1 VIEW COMPARISON:  09/16/2018 FINDINGS: The heart size and mediastinal contours are within normal limits. Both lungs are clear. The visualized skeletal structures are unremarkable. IMPRESSION: No active disease. Electronically Signed   By: Burman Nieves M.D.   On: 10/13/2018 22:01        Scheduled Meds: . enoxaparin (LOVENOX) injection  40 mg Subcutaneous Q24H  . methylPREDNISolone (SOLU-MEDROL) injection  40 mg Intravenous Q6H   Followed by  . [START ON 10/15/2018] predniSONE  40 mg Oral Q breakfast  . montelukast  10 mg Oral QHS   Continuous Infusions:   LOS: 0 days    Time spent: 35 minutes   Serenah Mill, MD [facility] Pager [phone]  If 7PM-7AM, please contact night-coverage [email or web address] Password [email or web address] 10/14/2018, 1:26 PM

## 2018-10-14 NOTE — Evaluation (Signed)
Physical Therapy Evaluation Patient Details Name: Sherri Shaw MRN: [id number] DOB: 1980/11/14 Today's Date: 10/14/2018   History of Present Illness  Sherri Shaw is a 38 y.o. female with medical history significant of anemia, asthma, history of bipolar disorder, schizoaffective schizophrenia who is coming to the emergency department due to progressively worse dyspnea, nonproductive cough, and wheezing, that has not responded to new inhalers prescribed by her PCP    Clinical Impression  Patient evaluated by Physical Therapy with no further acute PT needs identified. All education has been completed and the patient has no further questions. PTA pt independent wit mobility living with her sister, today ambulating unit WNL, VSS on RA no SOB or DOE.  See below for any follow-up Physical Therapy or equipment needs. PT is signing off. Thank you for this referral.     Follow Up Recommendations No PT follow up    Equipment Recommendations  Rolling walker with 5" wheels    Recommendations for Other Services       Precautions / Restrictions Precautions Precautions: Fall      Mobility  Bed Mobility Overal bed mobility: Independent                Transfers Overall transfer level: Independent                  Ambulation/Gait Ambulation/Gait assistance: Independent Gait Distance (Feet): 500 Feet   Gait Pattern/deviations: WFL(Within Functional Limits)     General Gait Details: VSS, WNL gait on RA  Stairs            Wheelchair Mobility    Modified Rankin (Stroke Patients Only)       Balance Overall balance assessment: Independent                                           Pertinent Vitals/Pain Pain Assessment: No/denies pain    Home Living Family/patient expects to be discharged to:: Private residence Living Arrangements: Other relatives Available Help at Discharge: Family;Available PRN/intermittently             Additional  Comments: 1 level home, sister works, I at baseline community ambulation     Prior Function Level of Independence: Independent               Journalist, newspaper        Extremity/Trunk Assessment   Upper Extremity Assessment Upper Extremity Assessment: Overall WFL for tasks assessed    Lower Extremity Assessment Lower Extremity Assessment: Overall WFL for tasks assessed       Communication   Communication: No difficulties  Cognition Arousal/Alertness: Awake/alert                                            General Comments      Exercises     Assessment/Plan    PT Assessment Patent does not need any further PT services  PT Problem List         PT Treatment Interventions      PT Goals (Current goals can be found in the Care Plan section)  Acute Rehab PT Goals Patient Stated Goal: go home PT Goal Formulation: With patient    Frequency     Barriers to discharge  Co-evaluation               AM-PAC PT "6 Clicks" Mobility  Outcome Measure Help needed turning from your back to your side while in a flat bed without using bedrails?: None Help needed moving from lying on your back to sitting on the side of a flat bed without using bedrails?: None Help needed moving to and from a bed to a chair (including a wheelchair)?: None Help needed standing up from a chair using your arms (e.g., wheelchair or bedside chair)?: None Help needed to walk in hospital room?: None Help needed climbing 3-5 steps with a railing? : None 6 Click Score: 24    End of Session Equipment Utilized During Treatment: Gait belt Activity Tolerance: Patient tolerated treatment well Patient left: in bed Nurse Communication: Mobility status PT Visit Diagnosis: Difficulty in walking, not elsewhere classified (R26.2)    Time: 1335-1400 PT Time Calculation (min) (ACUTE ONLY): 25 min   Charges:   PT Evaluation $PT Eval Low Complexity: 1 Low PT Treatments $Gait  Training: 8-22 mins       Reinaldo Berber, PT, DPT Acute Rehabilitation Services Pager: [phone] Office: [place] 10/14/2018, 3:00 PM

## 2018-10-14 NOTE — Progress Notes (Signed)
  Echocardiogram 2D Echocardiogram has been performed.  Darlina Sicilian M 10/14/2018, 9:41 AM

## 2018-10-15 MED ORDER — ALBUTEROL SULFATE (2.5 MG/3ML) 0.083% IN NEBU: 2.5000 mg | INHALATION_SOLUTION | Freq: Four times a day (QID) | RESPIRATORY_TRACT | 11 refills | Status: DC | PRN

## 2018-10-15 MED ORDER — PREDNISONE 20 MG PO TABS: 40.0000 mg | ORAL_TABLET | Freq: Every day | ORAL | 0 refills | Status: DC

## 2018-10-15 MED ORDER — DOXYCYCLINE HYCLATE 50 MG PO CAPS: 100.0000 mg | ORAL_CAPSULE | Freq: Two times a day (BID) | ORAL | 0 refills | Status: DC

## 2018-10-15 MED FILL — DOXYCYCLINE HYC 50 MG CAP: 50 | 7 days supply | Qty: 28 | Fill #0

## 2018-10-15 MED FILL — ALBUTEROL SUL 2.5 MG/3 ML S: (2.5 MG/3ML | 6 days supply | Qty: 75 | Fill #0

## 2018-10-15 MED FILL — predniSONE 20 MG TABS: 20 | 3 days supply | Qty: 7 | Fill #0

## 2018-10-15 NOTE — TOC Transition Note (Addendum)
Transition of Care [facility]) - CM/SW Discharge Note   Patient Details  Name: Sherri Shaw MRN: [id number] Date of Birth: 09-29-1980  Transition of Care [facility]) CM/SW Contact:  Carles Collet, RN Phone Number: 10/15/2018, 11:35 AM   Clinical Narrative:     Spoke with patient and sister at bedside. Patient states that she uses bus and/ or family assistance for transportation. [facility], and uses [facility] for her meds, she states she is able to afford her medications and pays cash for them. She states she has a nebulizer at home. Patient was provided with [facility] letter prior to DC. No other CM needs identified.     Final next level of care: Home/Self Care Barriers to Discharge: No Barriers Identified   Patient Goals and CMS Choice        Discharge Placement                       Discharge Plan and Services                        Social Determinants of Health (SDOH) Interventions     Readmission Risk Interventions No flowsheet data found.

## 2018-10-15 NOTE — Discharge Summary (Signed)
Physician Discharge Summary  Sherri Shaw XBD:[id number] DOB: 1980-12-10 DOA: 10/13/2018  PCP: Lanae Boast, FNP  Admit date: 10/13/2018 Discharge date: 10/15/2018  Time spent: 35 minutes  Recommendations for Outpatient Follow-up:  1. Follow-up with primary care physician in 1 week   Discharge Diagnoses:  Principal Problem:   Asthma exacerbation Active Problems:   Bipolar disorder ([place])   Schizo-affective schizophrenia ([place])   Acute asthma exacerbation   Discharge Condition: Stable  Diet recommendation: Regular  Filed Weights   10/14/18 0131  Weight: 64.8 kg    History of present illness:  Sherri Shaw a 38 y.o.femalewith medical history significant ofanemia, asthma, history of bipolar disorder, schizoaffective schizophrenia who is coming to the emergency department due to progressively worse dyspnea, nonproductive cough, and wheezing, that has not responded to new inhalers prescribed by her PCP. She had audible wheezing when she first arrived to the emergency department. She denies fever, chills, but feels fatigued. No rhinorrhea, sore throat, productive cough, hemoptysis, chest pain, palpitations, dizziness, diaphoresis, PND, orthopnea or pitting edema of the lower extremities. No abdominal pain, nausea or emesis, diarrhea or constipation, melena or hematochezia. No dysuria, frequency or hematuria.  Hospital Course:  ##Acute asthma exacerbation -Failed outpatient treatment -Patient continues to have bilateral diffuse wheezing -Patient was hypoxic at admission with oxygen saturations of 88% on room air -Continue with albuterol, Solu-Medrol -Improvement with wheezing at the time of the discharge.  Patient has good air entry with occasional wheezing.  Patient is currently on room air  ##Acute hypoxic respiratory failure -Secondary to asthma exacerbation -Continue with oxygen through nasal cannula plan -currently on room air  ##Hypokalemia -Replace by  mouth -Normalized  ##Abormal EKG.  -poor R wave progression in V1, V2, V3 -echocardiogram-normal ejection fraction, no wall motion abnormalities, no valvular abnormalities  ##Bipolar disorder (HCC) Schizo-affective schizophrenia ([place]) -no current issues and-not on any medication  Procedures:  Echocardiogram   Discharge Exam: Vitals:   10/15/18 0718 10/15/18 0718  BP: 110/79 110/79  Pulse: 71 69  Resp: 17 17  Temp: (!) 97.4 F (36.3 C) (!) 97.4 F (36.3 C)  SpO2: 93% 93%    General exam: Appears calm and comfortable  Respiratory system: Bilateral wheezing. Cardiovascular system: S1 & S2 heard, RRR. No JVD, murmurs, rubs, gallops or clicks. No pedal edema. Gastrointestinal system: Abdomen is nondistended, soft and nontender. No organomegaly or masses felt. Normal bowel sounds heard. Central nervous system: Alert and oriented. No focal neurological deficits. Extremities: Symmetric 5 x 5 power. Skin: No rashes, lesions or ulcers Psychiatry: Judgement and insight appear normal. Mood & affect appropriate.  Discharge Instructions   Discharge Instructions    Diet - low sodium heart healthy   Complete by:  As directed    Increase activity slowly   Complete by:  As directed      Allergies as of 10/15/2018   No Known Allergies     Medication List    TAKE these medications   albuterol 108 (90 Base) MCG/ACT inhaler Commonly known as:  PROVENTIL HFA;VENTOLIN HFA Inhale 2 puffs into the lungs every 4 (four) hours as needed for wheezing or shortness of breath.   albuterol (2.5 MG/3ML) 0.083% nebulizer solution Commonly known as:  PROVENTIL Take 3 mLs (2.5 mg total) by nebulization every 6 (six) hours as needed for up to 30 days for wheezing or shortness of breath.   doxycycline 50 MG capsule Commonly known as:  VIBRAMYCIN Take 2 capsules (100 mg total) by mouth 2 (two) times daily.  ipratropium 0.02 % nebulizer solution Commonly known as:  ATROVENT Take 2.5  mLs (0.5 mg total) by nebulization every 6 (six) hours as needed for wheezing or shortness of breath.   montelukast 10 MG tablet Commonly known as:  SINGULAIR Take 1 tablet (10 mg total) by mouth at bedtime.   predniSONE 20 MG tablet Commonly known as:  DELTASONE Take 2 tablets (40 mg total) by mouth daily with breakfast.      No Known Allergies    The results of significant diagnostics from this hospitalization (including imaging, microbiology, ancillary and laboratory) are listed below for reference.    Significant Diagnostic Studies: Dg Chest 2 View  Result Date: 09/16/2018 CLINICAL DATA:  38 y/o  F; shortness of breath. EXAM: CHEST - 2 VIEW COMPARISON:  07/01/2018 chest radiograph FINDINGS: Stable heart size and mediastinal contours are within normal limits. Both lungs are clear. The visualized skeletal structures are unremarkable. IMPRESSION: No acute pulmonary process identified. Electronically Signed   By: Kristine Garbe M.D.   On: 09/16/2018 00:35   Dg Chest Portable 1 View  Result Date: 10/13/2018 CLINICAL DATA:  Shortness of breath EXAM: PORTABLE CHEST 1 VIEW COMPARISON:  09/16/2018 FINDINGS: The heart size and mediastinal contours are within normal limits. Both lungs are clear. The visualized skeletal structures are unremarkable. IMPRESSION: No active disease. Electronically Signed   By: Lucienne Capers M.D.   On: 10/13/2018 22:01    Microbiology: No results found for this or any previous visit (from the past 240 hour(s)).   Labs: Basic Metabolic Panel: Recent Labs  Lab 10/13/18 2136 10/14/18 0306  NA 136 136  K 3.5 3.0*  CL 102 101  CO2 25 24  GLUCOSE 94 215*  BUN 7 5*  CREATININE 0.75 0.95  CALCIUM 9.0 8.7*  MG 2.0  --   PHOS 3.4  --    Liver Function Tests: Recent Labs  Lab 10/14/18 0306  AST 23  ALT 14  ALKPHOS 45  BILITOT 0.4  PROT 6.7  ALBUMIN 3.5   No results for input(s): LIPASE, AMYLASE in the last 168 hours. No results for  input(s): AMMONIA in the last 168 hours. CBC: Recent Labs  Lab 10/13/18 2136  WBC 8.0  NEUTROABS 4.6  HGB 13.9  HCT 45.4  MCV 89.7  PLT 295   Cardiac Enzymes: No results for input(s): CKTOTAL, CKMB, CKMBINDEX, TROPONINI in the last 168 hours. BNP: BNP (last 3 results) No results for input(s): BNP in the last 8760 hours.  ProBNP (last 3 results) No results for input(s): PROBNP in the last 8760 hours.  CBG: No results for input(s): GLUCAP in the last 168 hours.     SignedMonica Becton MD.  [facility] 10/15/2018, 12:02 PM

## 2018-10-28 ENCOUNTER — Telehealth: Payer: Self-pay

## 2018-10-28 NOTE — Telephone Encounter (Signed)
Left a vm for patient

## 2018-10-29 NOTE — Telephone Encounter (Signed)
Patient notified of lab results

## 2018-11-26 ENCOUNTER — Other Ambulatory Visit: Payer: Self-pay

## 2018-11-26 ENCOUNTER — Ambulatory Visit (INDEPENDENT_AMBULATORY_CARE_PROVIDER_SITE_OTHER): Payer: Self-pay | Admitting: Family Medicine

## 2018-11-26 ENCOUNTER — Encounter: Payer: Self-pay | Admitting: Family Medicine

## 2018-11-26 VITALS — BP 121/72 | HR 64 | Temp 98.5°F | Resp 14 | Ht 62.0 in | Wt 149.0 lb

## 2018-11-26 DIAGNOSIS — E876 Hypokalemia: Secondary | ICD-10-CM

## 2018-11-26 DIAGNOSIS — J4530 Mild persistent asthma, uncomplicated: Secondary | ICD-10-CM

## 2018-11-26 MED ORDER — ALBUTEROL SULFATE (2.5 MG/3ML) 0.083% IN NEBU: 2.5000 mg | INHALATION_SOLUTION | Freq: Four times a day (QID) | RESPIRATORY_TRACT | 11 refills | Status: DC | PRN

## 2018-11-26 MED ORDER — POTASSIUM CHLORIDE ER 10 MEQ PO TBCR: 10.0000 meq | EXTENDED_RELEASE_TABLET | Freq: Every day | ORAL | 2 refills | Status: DC

## 2018-11-26 MED ORDER — IPRATROPIUM BROMIDE 0.02 % IN SOLN: 0.5000 mg | Freq: Four times a day (QID) | RESPIRATORY_TRACT | 11 refills | Status: DC | PRN

## 2018-11-26 MED ORDER — ALBUTEROL SULFATE HFA 108 (90 BASE) MCG/ACT IN AERS: 2.0000 | INHALATION_SPRAY | RESPIRATORY_TRACT | 11 refills | Status: DC | PRN

## 2018-11-26 NOTE — Patient Instructions (Signed)
Asthma, Adult    Asthma is a long-term (chronic) condition in which the airways get tight and narrow. The airways are the breathing passages that lead from the nose and mouth down into the lungs. A person with asthma will have times when symptoms get worse. These are called asthma attacks. They can cause coughing, whistling sounds when you breathe (wheezing), shortness of breath, and chest pain. They can make it hard to breathe. There is no cure for asthma, but medicines and lifestyle changes can help control it.  There are many things that can bring on an asthma attack or make asthma symptoms worse (triggers). Common triggers include:  · Mold.  · Dust.  · Cigarette smoke.  · Cockroaches.  · Things that can cause allergy symptoms (allergens). These include animal skin flakes (dander) and pollen from trees or grass.  · Things that pollute the air. These may include household cleaners, wood smoke, smog, or chemical odors.  · Cold air, weather changes, and wind.  · Crying or laughing hard.  · Stress.  · Certain medicines or drugs.  · Certain foods such as dried fruit, potato chips, and grape juice.  · Infections, such as a cold or the flu.  · Certain medical conditions or diseases.  · Exercise or tiring activities.  Asthma may be treated with medicines and by staying away from the things that cause asthma attacks. Types of medicines may include:  · Controller medicines. These help prevent asthma symptoms. They are usually taken every day.  · Fast-acting reliever or rescue medicines. These quickly relieve asthma symptoms. They are used as needed and provide short-term relief.  · Allergy medicines if your attacks are brought on by allergens.  · Medicines to help control the body's defense (immune) system.  Follow these instructions at home:  Avoiding triggers in your home  · Change your heating and air conditioning filter often.  · Limit your use of fireplaces and wood stoves.  · Get rid of pests (such as roaches and  mice) and their droppings.  · Throw away plants if you see mold on them.  · Clean your floors. Dust regularly. Use cleaning products that do not smell.  · Have someone vacuum when you are not home. Use a vacuum cleaner with a HEPA filter if possible.  · Replace carpet with wood, tile, or vinyl flooring. Carpet can trap animal skin flakes and dust.  · Use allergy-proof pillows, mattress covers, and box spring covers.  · Wash bed sheets and blankets every week in hot water. Dry them in a dryer.  · Keep your bedroom free of any triggers.   · Avoid pets and keep windows closed when things that cause allergy symptoms are in the air.  · Use blankets that are made of polyester or cotton.  · Clean bathrooms and kitchens with bleach. If possible, have someone repaint the walls in these rooms with mold-resistant paint. Keep out of the rooms that are being cleaned and painted.  · Wash your hands often with soap and water. If soap and water are not available, use hand sanitizer.  · Do not allow anyone to smoke in your home.  General instructions  · Take over-the-counter and prescription medicines only as told by your doctor.  ? Talk with your doctor if you have questions about how or when to take your medicines.  ? Make note if you need to use your medicines more often than usual.  · Do not use any products that   contain nicotine or tobacco, such as cigarettes and e-cigarettes. If you need help quitting, ask your doctor.  · Stay away from secondhand smoke.  · Avoid doing things outdoors when allergen counts are high and when air quality is low.  · Wear a ski mask when doing outdoor activities in the winter. The mask should cover your nose and mouth. Exercise indoors on cold days if you can.  · Warm up before you exercise. Take time to cool down after exercise.  · Use a peak flow meter as told by your doctor. A peak flow meter is a tool that measures how well the lungs are working.  · Keep track of the peak flow meter's readings.  Write them down.  · Follow your asthma action plan. This is a written plan for taking care of your asthma and treating your attacks.  · Make sure you get all the shots (vaccines) that your doctor recommends. Ask your doctor about a flu shot and a pneumonia shot.  · Keep all follow-up visits as told by your doctor. This is important.  Contact a doctor if:  · You have wheezing, shortness of breath, or a cough even while taking medicine to prevent attacks.  · The mucus you cough up (sputum) is thicker than usual.  · The mucus you cough up changes from clear or white to yellow, green, gray, or bloody.  · You have problems from the medicine you are taking, such as:  ? A rash.  ? Itching.  ? Swelling.  ? Trouble breathing.  · You need reliever medicines more than 2-3 times a week.  · Your peak flow reading is still at 50-79% of your personal best after following the action plan for 1 hour.  · You have a fever.  Get help right away if:  · You seem to be worse and are not responding to medicine during an asthma attack.  · You are short of breath even at rest.  · You get short of breath when doing very little activity.  · You have trouble eating, drinking, or talking.  · You have chest pain or tightness.  · You have a fast heartbeat.  · Your lips or fingernails start to turn blue.  · You are light-headed or dizzy, or you faint.  · Your peak flow is less than 50% of your personal best.  · You feel too tired to breathe normally.  Summary  · Asthma is a long-term (chronic) condition in which the airways get tight and narrow. An asthma attack can make it hard to breathe.  · Asthma cannot be cured, but medicines and lifestyle changes can help control it.  · Make sure you understand how to avoid triggers and how and when to use your medicines.  This information is not intended to replace advice given to you by your health care provider. Make sure you discuss any questions you have with your health care provider.  Document  Released: 01/09/2008 Document Revised: 08/27/2016 Document Reviewed: 08/27/2016  [employer]vier Interactive Patient Education © 2019 [employer]

## 2018-11-26 NOTE — Progress Notes (Signed)
Patient [name]   Progress Note: General Provider: Lanae Boast, FNP  SUBJECTIVE:   Sherri Shaw is a 38 y.o. female who  has a past medical history of Anemia, Asthma, Bipolar disorder ([place]), Chronic bronchitis ([place]), Exertional shortness of breath, History of blood transfusion (04/07/2013), Microcytic anemia, and Schizophrenia ([place]).. Patient presents today for Asthma (6 month follow up ) Patient reports compliance with medications. She was seen in the ED on 10/13/2018 for an asthma exacerbation and noted to have low potassium.     Review of Systems  Constitutional: Negative.   HENT: Negative.   Eyes: Negative.   Respiratory: Negative.   Cardiovascular: Negative.   Gastrointestinal: Negative.   Genitourinary: Negative.   Musculoskeletal: Negative.   Skin: Negative.   Neurological: Negative.   Psychiatric/Behavioral: Negative.      OBJECTIVE: BP 121/72 (BP Location: Left Arm, Patient Position: Sitting, Cuff Size: Normal)   Pulse 64   Temp 98.5 F (36.9 C) (Oral)   Resp 14   Ht 5\' 2"  (1.575 m)   Wt 149 lb (67.6 kg)   LMP 05/25/2013   SpO2 100%   BMI 27.25 kg/m   Wt Readings from Last 3 Encounters:  11/26/18 149 lb (67.6 kg)  10/14/18 142 lb 13.7 oz (64.8 kg)  10/08/18 145 lb (65.8 kg)     Physical Exam Vitals signs and nursing note reviewed.  Constitutional:      General: She is not in acute distress.    Appearance: Normal appearance.  HENT:     Head: Normocephalic and atraumatic.  Eyes:     Extraocular Movements: Extraocular movements intact.     Conjunctiva/sclera: Conjunctivae normal.     Pupils: Pupils are equal, round, and reactive to light.  Cardiovascular:     Rate and Rhythm: Normal rate and regular rhythm.     Heart sounds: No murmur.  Pulmonary:     Effort: Pulmonary effort is normal.     Breath sounds: Normal breath sounds.  Musculoskeletal: Normal range of motion.  Skin:    General: Skin is warm and  dry.  Neurological:     Mental Status: She is alert and oriented to person, place, and time.  Psychiatric:        Mood and Affect: Mood normal.        Behavior: Behavior normal.        Thought Content: Thought content normal.        Judgment: Judgment normal.     ASSESSMENT/PLAN:   1. Mild persistent asthma without complication - albuterol (VENTOLIN HFA) 108 (90 Base) MCG/ACT inhaler; Inhale 2 puffs into the lungs every 4 (four) hours as needed for wheezing or shortness of breath.  Dispense: 1 Inhaler; Refill: 11 - albuterol (PROVENTIL) (2.5 MG/3ML) 0.083% nebulizer solution; Take 3 mLs (2.5 mg total) by nebulization every 6 (six) hours as needed for up to 30 days for wheezing or shortness of breath.  Dispense: 75 mL; Refill: 11 - ipratropium (ATROVENT) 0.02 % nebulizer solution; Take 2.5 mLs (0.5 mg total) by nebulization every 6 (six) hours as needed for wheezing or shortness of breath.  Dispense: 75 mL; Refill: 11 2. Hypokalemia - Comprehensive metabolic panel - potassium chloride (K-DUR) 10 MEQ tablet; Take 1 tablet (10 mEq total) by mouth daily.  Dispense: 30 tablet; Refill: 2   Return in about 6 months (around 05/28/2019).    The patient was given clear instructions to go to ER or return to medical  center if symptoms do not improve, worsen or new problems develop. The patient verbalized understanding and agreed with plan of care.   Sherri Shaw. Sherri Canary, FNP-BC [facility] [address] [place], [place] [address] [phone]

## 2018-11-27 LAB — COMPREHENSIVE METABOLIC PANEL
ALT: 8 IU/L (ref 0–32)
AST: 9 IU/L (ref 0–40)
Albumin/Globulin Ratio: 1.6 (ref 1.2–2.2)
Albumin: 4.2 g/dL (ref 3.8–4.8)
Alkaline Phosphatase: 49 IU/L (ref 39–117)
BUN/Creatinine Ratio: 8 — ABNORMAL LOW (ref 9–23)
BUN: 6 mg/dL (ref 6–24)
Bilirubin Total: 0.3 mg/dL (ref 0.0–1.2)
CO2: 25 mmol/L (ref 20–29)
Calcium: 8.9 mg/dL (ref 8.7–10.2)
Chloride: 103 mmol/L (ref 96–106)
Creatinine, Ser: 0.73 mg/dL (ref 0.57–1.00)
GFR calc Af Amer: 119 mL/min/{1.73_m2} (ref >59)
GFR calc non Af Amer: 103 mL/min/{1.73_m2} (ref >59)
Globulin, Total: 2.6 g/dL (ref 1.5–4.5)
Glucose: 84 mg/dL (ref 65–99)
Potassium: 4 mmol/L (ref 3.5–5.2)
Sodium: 141 mmol/L (ref 134–144)
Total Protein: 6.8 g/dL (ref 6.0–8.5)

## 2018-11-27 NOTE — Progress Notes (Signed)
Sent letter due to patients phone not able to accept incoming calls. Thanks !

## 2019-04-15 ENCOUNTER — Encounter (HOSPITAL_COMMUNITY): Payer: Self-pay | Admitting: *Deleted

## 2019-04-15 ENCOUNTER — Encounter (HOSPITAL_COMMUNITY): Payer: Self-pay

## 2019-05-28 ENCOUNTER — Ambulatory Visit: Payer: Self-pay | Admitting: Family Medicine

## 2019-06-23 ENCOUNTER — Ambulatory Visit: Payer: Self-pay | Admitting: Family Medicine

## 2019-06-30 ENCOUNTER — Ambulatory Visit (INDEPENDENT_AMBULATORY_CARE_PROVIDER_SITE_OTHER): Payer: Self-pay | Admitting: Family Medicine

## 2019-06-30 ENCOUNTER — Other Ambulatory Visit: Payer: Self-pay

## 2019-06-30 ENCOUNTER — Encounter: Payer: Self-pay | Admitting: Family Medicine

## 2019-06-30 VITALS — BP 117/79 | HR 77 | Temp 98.0°F | Ht 62.0 in | Wt 153.8 lb

## 2019-06-30 DIAGNOSIS — Z09 Encounter for follow-up examination after completed treatment for conditions other than malignant neoplasm: Secondary | ICD-10-CM

## 2019-06-30 DIAGNOSIS — Z0000 Encounter for general adult medical examination without abnormal findings: Secondary | ICD-10-CM

## 2019-06-30 DIAGNOSIS — Z1231 Encounter for screening mammogram for malignant neoplasm of breast: Secondary | ICD-10-CM

## 2019-06-30 DIAGNOSIS — J4530 Mild persistent asthma, uncomplicated: Secondary | ICD-10-CM

## 2019-06-30 DIAGNOSIS — R8290 Unspecified abnormal findings in urine: Secondary | ICD-10-CM

## 2019-06-30 LAB — POCT URINALYSIS DIPSTICK
Bilirubin, UA: NEGATIVE
Glucose, UA: NEGATIVE
Ketones, UA: NEGATIVE
Nitrite, UA: NEGATIVE
Protein, UA: NEGATIVE
Spec Grav, UA: >=1.03 — AB (ref 1.010–1.025)
Urobilinogen, UA: 0.2 E.U./dL
pH, UA: 5.5 (ref 5.0–8.0)

## 2019-06-30 LAB — POCT GLYCOSYLATED HEMOGLOBIN (HGB A1C): Hemoglobin A1C: 4.6 % (ref 4.0–5.6)

## 2019-06-30 LAB — GLUCOSE, POCT (MANUAL RESULT ENTRY): POC Glucose: 85 mg/dl (ref 70–99)

## 2019-06-30 MED ORDER — ALBUTEROL SULFATE HFA 108 (90 BASE) MCG/ACT IN AERS: 2.0000 | INHALATION_SPRAY | RESPIRATORY_TRACT | 12 refills | Status: DC | PRN

## 2019-06-30 NOTE — Progress Notes (Signed)
Patient [name]  Established Patient Office Visit  Subjective:  Patient ID: Sherri Shaw, female    DOB: [birth year]  Age: 38 y.o. MRN: [id number]  CC:  Chief Complaint  Patient presents with  . Follow-up    Former Sherri Shaw patient 6 month follow up -    HPI Sherri Shaw is a 38 year old female who presents for Follow Up today.   Past Medical History:  Diagnosis Date  . Anemia   . Asthma   . Bipolar disorder [facility])    sister denies this hx on 04/07/2013  . Chronic bronchitis ([place])    "I get it q yr" (04/07/2013)  . Exertional shortness of breath    ? due to anemia  . History of blood transfusion 04/07/2013   "got my first transfusion today; got 4 units" (04/07/2013)  . Microcytic anemia    Archie Endo 04/07/2013  . Schizophrenia ([place])    "don't really know the type"/sister (04/07/2013)   Current Status: This will be her initial office visit with me. She was previously seeing Lanae Boast, NP for her PCP needs. Since her last office visit, she is doing well with no complaints.  She states that she occasionally uses Albuterol inhaler. She denies fevers, chills, fatigue, recent infections, weight loss, and night sweats. She has not had any headaches, visual changes, dizziness, and falls. No chest pain, heart palpitations, cough and shortness of breath reported. No reports of GI problems such as nausea, vomiting, diarrhea, and constipation. She has no reports of blood in stools, dysuria and hematuria. No depression or anxiety, and denies suicidal ideations, homicidal ideations, or auditory hallucinations. She denies pain today.   Past Surgical History:  Procedure Laterality Date  . ABDOMINAL HYSTERECTOMY N/A 06/01/2013   Procedure: HYSTERECTOMY ABDOMINAL;  Surgeon: Lavonia Drafts, MD;  Location: [facility];  Service: Gynecology;  Laterality: N/A;  . BILATERAL SALPINGECTOMY Bilateral 06/01/2013   Procedure: BILATERAL SALPINGECTOMY;  Surgeon: Lavonia Drafts, MD;  Location: [facility];  Service: Gynecology;  Laterality: Bilateral;  . CYSTOSCOPY W/ URETERAL STENT PLACEMENT Bilateral 06/01/2013   Procedure: CYSTOSCOPY WITH BILATERAL STENT REPLACEMENT;  Surgeon: Irine Seal, MD;  Location: [facility];  Service: Urology;  Laterality: Bilateral;  . HYSTERECTOMY ABDOMINAL WITH SALPINGECTOMY    . NO PAST SURGERIES      Family History  Problem Relation Age of Onset  . Hypertension Mother   . Diabetes Mother   . Asthma Mother   . Stroke Mother   . Cancer Mother   . Asthma Brother   . Heart disease Maternal Aunt     Social History   Socioeconomic History  . Marital status: Single    Spouse name: Not on file  . Number of children: Not on file  . Years of education: Coll  . Highest education level: Not on file  Occupational History  . Not on file  Social Needs  . Financial resource strain: Not on file  . Food insecurity    Worry: Not on file    Inability: Not on file  . Transportation needs    Medical: Not on file    Non-medical: Not on file  Tobacco Use  . Smoking status: Never Smoker  . Smokeless tobacco: Never Used  Substance and Sexual Activity  . Alcohol use: No  . Drug use: No  . Sexual activity: Not Currently  Lifestyle  . Physical activity    Days per week: Not on file  Minutes per session: Not on file  . Stress: Not on file  Relationships  . Social Herbalist on phone: Not on file    Gets together: Not on file    Attends religious service: Not on file    Active member of club or organization: Not on file    Attends meetings of clubs or organizations: Not on file    Relationship status: Not on file  . Intimate partner violence    Fear of current or ex partner: Not on file    Emotionally abused: Not on file    Physically abused: Not on file    Forced sexual activity: Not on file  Other Topics Concern  . 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 albuterol (PROVENTIL) (2.5 MG/3ML) 0.083% nebulizer solution Take 3 mLs (2.5 mg total) by nebulization every 6 (six) hours as needed for up to 30 days for wheezing or shortness of breath. 75 mL 11  . ipratropium (ATROVENT) 0.02 % nebulizer solution Take 2.5 mLs (0.5 mg total) by nebulization every 6 (six) hours as needed for wheezing or shortness of breath. 75 mL 11  . albuterol (VENTOLIN HFA) 108 (90 Base) MCG/ACT inhaler Inhale 2 puffs into the lungs every 4 (four) hours as needed for wheezing or shortness of breath. 1 Inhaler 11  . potassium chloride (K-DUR) 10 MEQ tablet Take 1 tablet (10 mEq total) by mouth daily. 30 tablet 2   No facility-administered medications prior to visit.     No Known Allergies  ROS Review of Systems  Constitutional: Negative.   HENT: Negative.   Eyes: Negative.   Respiratory: Negative.   Cardiovascular: Negative.   Gastrointestinal: Negative.   Endocrine: Negative.   Genitourinary: Negative.   Musculoskeletal: Negative.   Skin: Negative.   Allergic/Immunologic: Negative.   Neurological: Negative.   Hematological: Negative.   Psychiatric/Behavioral: Negative.       Objective:    Physical Exam  Constitutional: She is oriented to person, place, and time. She appears well-developed and well-nourished.  HENT:  Head: Normocephalic and atraumatic.  Eyes: Conjunctivae are normal.  Neck: Normal range of motion. Neck supple.  Cardiovascular: Normal rate, regular rhythm, normal heart sounds and intact distal pulses.  Pulmonary/Chest: Effort normal and breath sounds normal.  Abdominal: Soft. Bowel sounds are normal. She exhibits distension (occasional).  Musculoskeletal: Normal range of motion.  Neurological: She is alert and oriented to person, place, and time. She has normal reflexes.  Skin: Skin is warm and dry.  Psychiatric: She has a  normal mood and affect. Her behavior is normal. Judgment and thought content normal.  Nursing note and vitals reviewed.   BP 117/79   Pulse 77   Temp 98 F (36.7 C) (Oral)   Ht 5\' 2"  (1.575 m)   Wt 153 lb 12.8 oz (69.8 kg)   LMP 05/25/2013   BMI 28.13 kg/m  Wt Readings from Last 3 Encounters:  06/30/19 153 lb 12.8 oz (69.8 kg)  11/26/18 149 lb (67.6 kg)  10/14/18 142 lb 13.7 oz (64.8 kg)     Health Maintenance Due  Topic Date Due  . INFLUENZA VACCINE  03/07/2019    There are no preventive care reminders to display for this patient.  Lab Results  Component Value Date   TSH 2.150  08/15/2017   Lab Results  Component Value Date   WBC 8.0 10/13/2018   HGB 13.9 10/13/2018   HCT 45.4 10/13/2018   MCV 89.7 10/13/2018   PLT 295 10/13/2018   Lab Results  Component Value Date   NA 141 11/26/2018   K 4.0 11/26/2018   CO2 25 11/26/2018   GLUCOSE 84 11/26/2018   BUN 6 11/26/2018   CREATININE 0.73 11/26/2018   BILITOT 0.3 11/26/2018   ALKPHOS 49 11/26/2018   AST 9 11/26/2018   ALT 8 11/26/2018   PROT 6.8 11/26/2018   ALBUMIN 4.2 11/26/2018   CALCIUM 8.9 11/26/2018   ANIONGAP 11 10/14/2018   No results found for: CHOL No results found for: HDL No results found for: LDLCALC No results found for: TRIG No results found for: [facility] Lab Results  Component Value Date   HGBA1C 4.6 06/30/2019    Assessment & Plan:   1. Mild persistent asthma without complication - albuterol (VENTOLIN HFA) 108 (90 Base) MCG/ACT inhaler; Inhale 2 puffs into the lungs every 4 (four) hours as needed for wheezing or shortness of breath.  Dispense: 8 g; Refill: 12  2. Abnormal urinalysis Results are pending.  - Urine Culture  3. Encounter for screening mammogram for malignant neoplasm of breast She will schedule for initial Mammogram. - MM Digital Diagnostic Bilat; Future  4. Health care maintenance - POCT HgB A1C - Urinalysis Dipstick - Glucose (CBG)  5. Follow up She will  follow up in 6 months.   Meds ordered this encounter  Medications  . albuterol (VENTOLIN HFA) 108 (90 Base) MCG/ACT inhaler    Sig: Inhale 2 puffs into the lungs every 4 (four) hours as needed for wheezing or shortness of breath.    Dispense:  8 g    Refill:  12    Orders Placed This Encounter  Procedures  . Urine Culture  . MM Digital Diagnostic Bilat  . POCT HgB A1C  . Urinalysis Dipstick  . Glucose (CBG)   Referral Orders  No referral(s) requested today    Kathe Becton,  MSN, FNP-BC [facility] [place], [place] [address] [phone]- fax   Problem List Items Addressed This Visit    None    Visit Diagnoses    Mild persistent asthma without complication    -  Primary   Relevant Medications   albuterol (VENTOLIN HFA) 108 (90 Base) MCG/ACT inhaler   Abnormal urinalysis       Relevant Orders   Urine Culture   Encounter for screening mammogram for malignant neoplasm of breast       Relevant Orders   MM Digital Diagnostic Bilat   Health care maintenance       Relevant Orders   POCT HgB A1C (Completed)   Urinalysis Dipstick (Completed)   Glucose (CBG) (Completed)   Follow up          Meds ordered this encounter  Medications  . albuterol (VENTOLIN HFA) 108 (90 Base) MCG/ACT inhaler    Sig: Inhale 2 puffs into the lungs every 4 (four) hours as needed for wheezing or shortness of breath.    Dispense:  8 g    Refill:  12    Follow-up: Return in about 6 months (around 12/28/2019).    Azzie Glatter, FNP

## 2019-07-03 LAB — URINE CULTURE

## 2019-07-14 IMAGING — CR DG CHEST 2V
2 series · 2 of 2 positions shown · non-contrast
Comparison: 09/03/2017.

CLINICAL DATA: Shortness of breath. Asthma.

EXAM:
CHEST - 2 VIEW

[w chest pa]
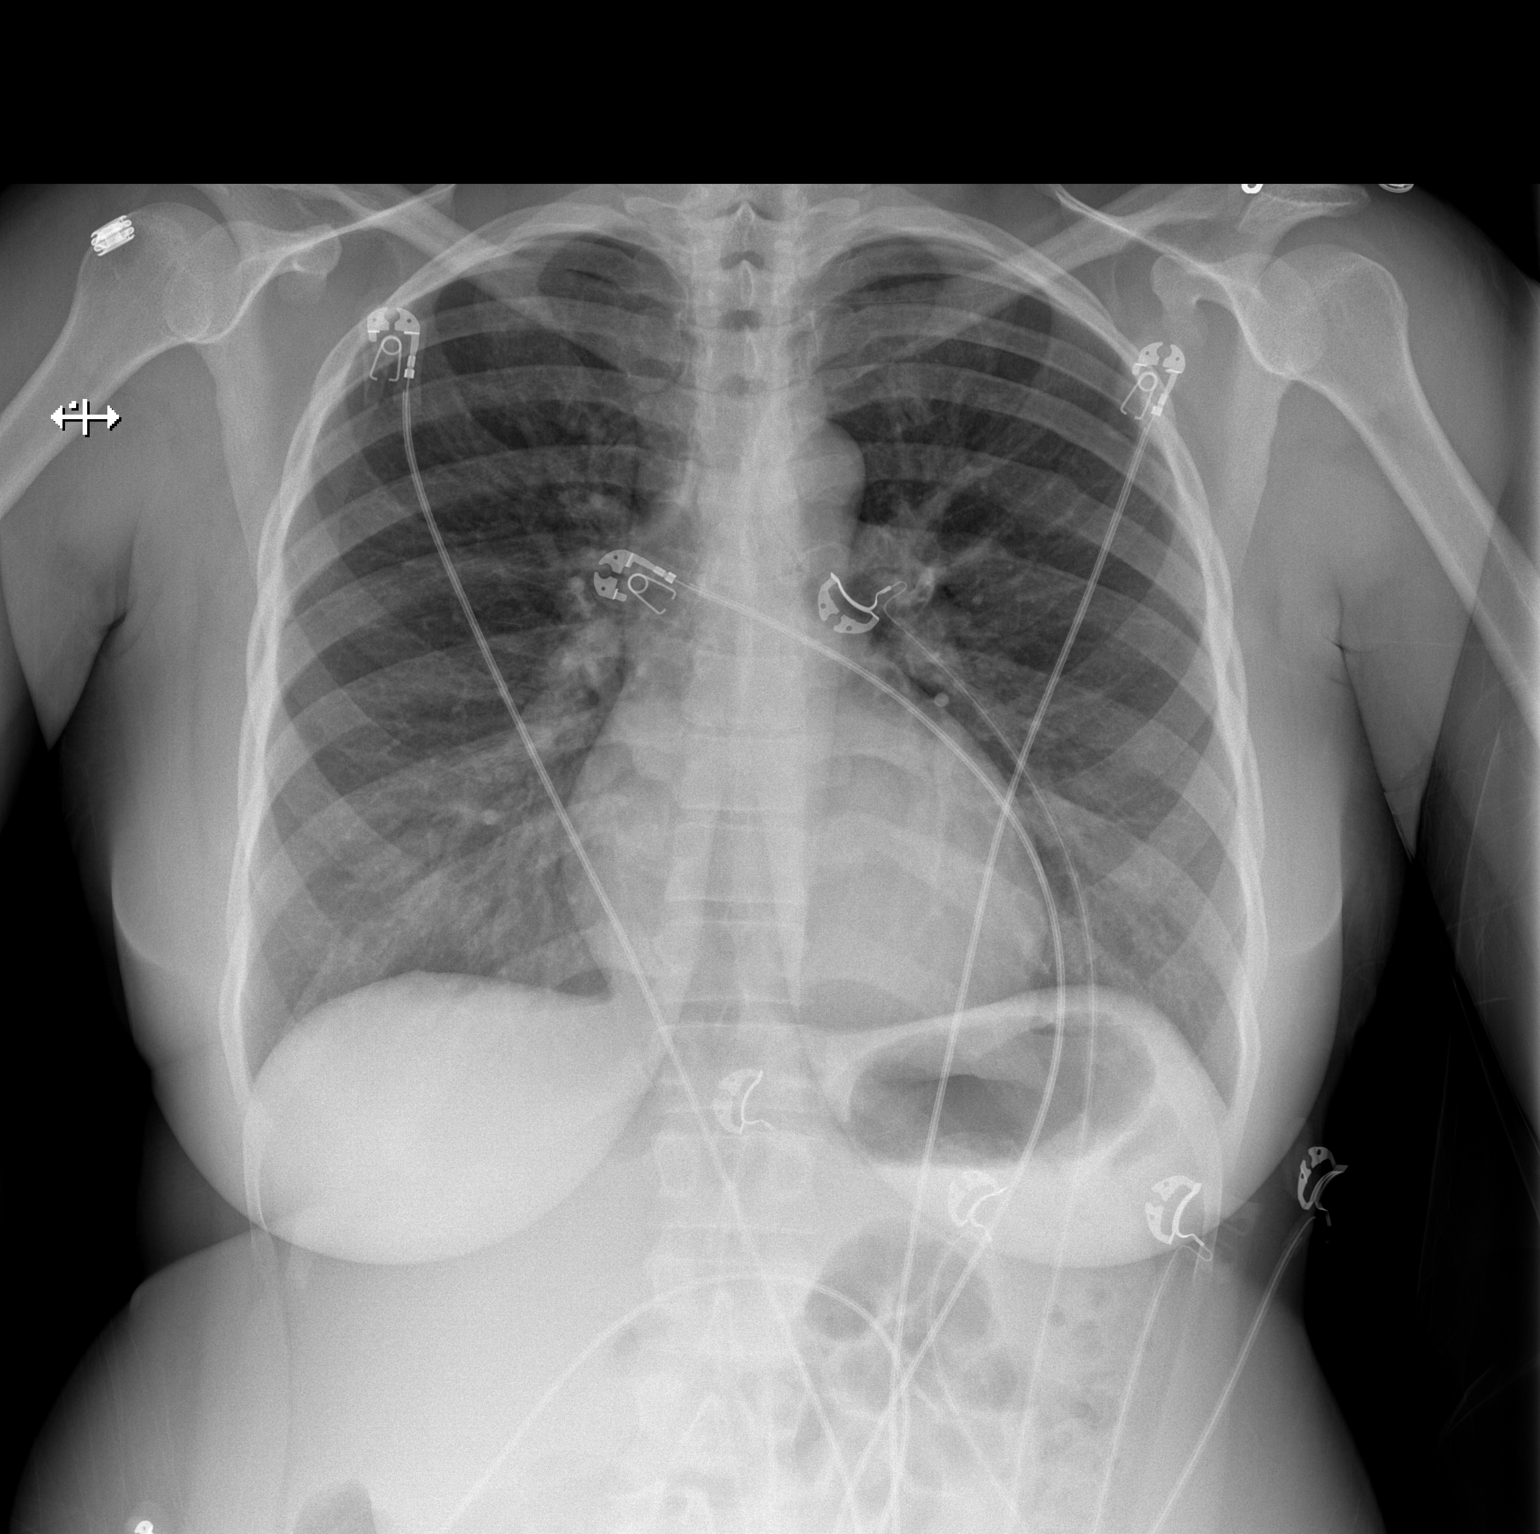

[w chest lat]
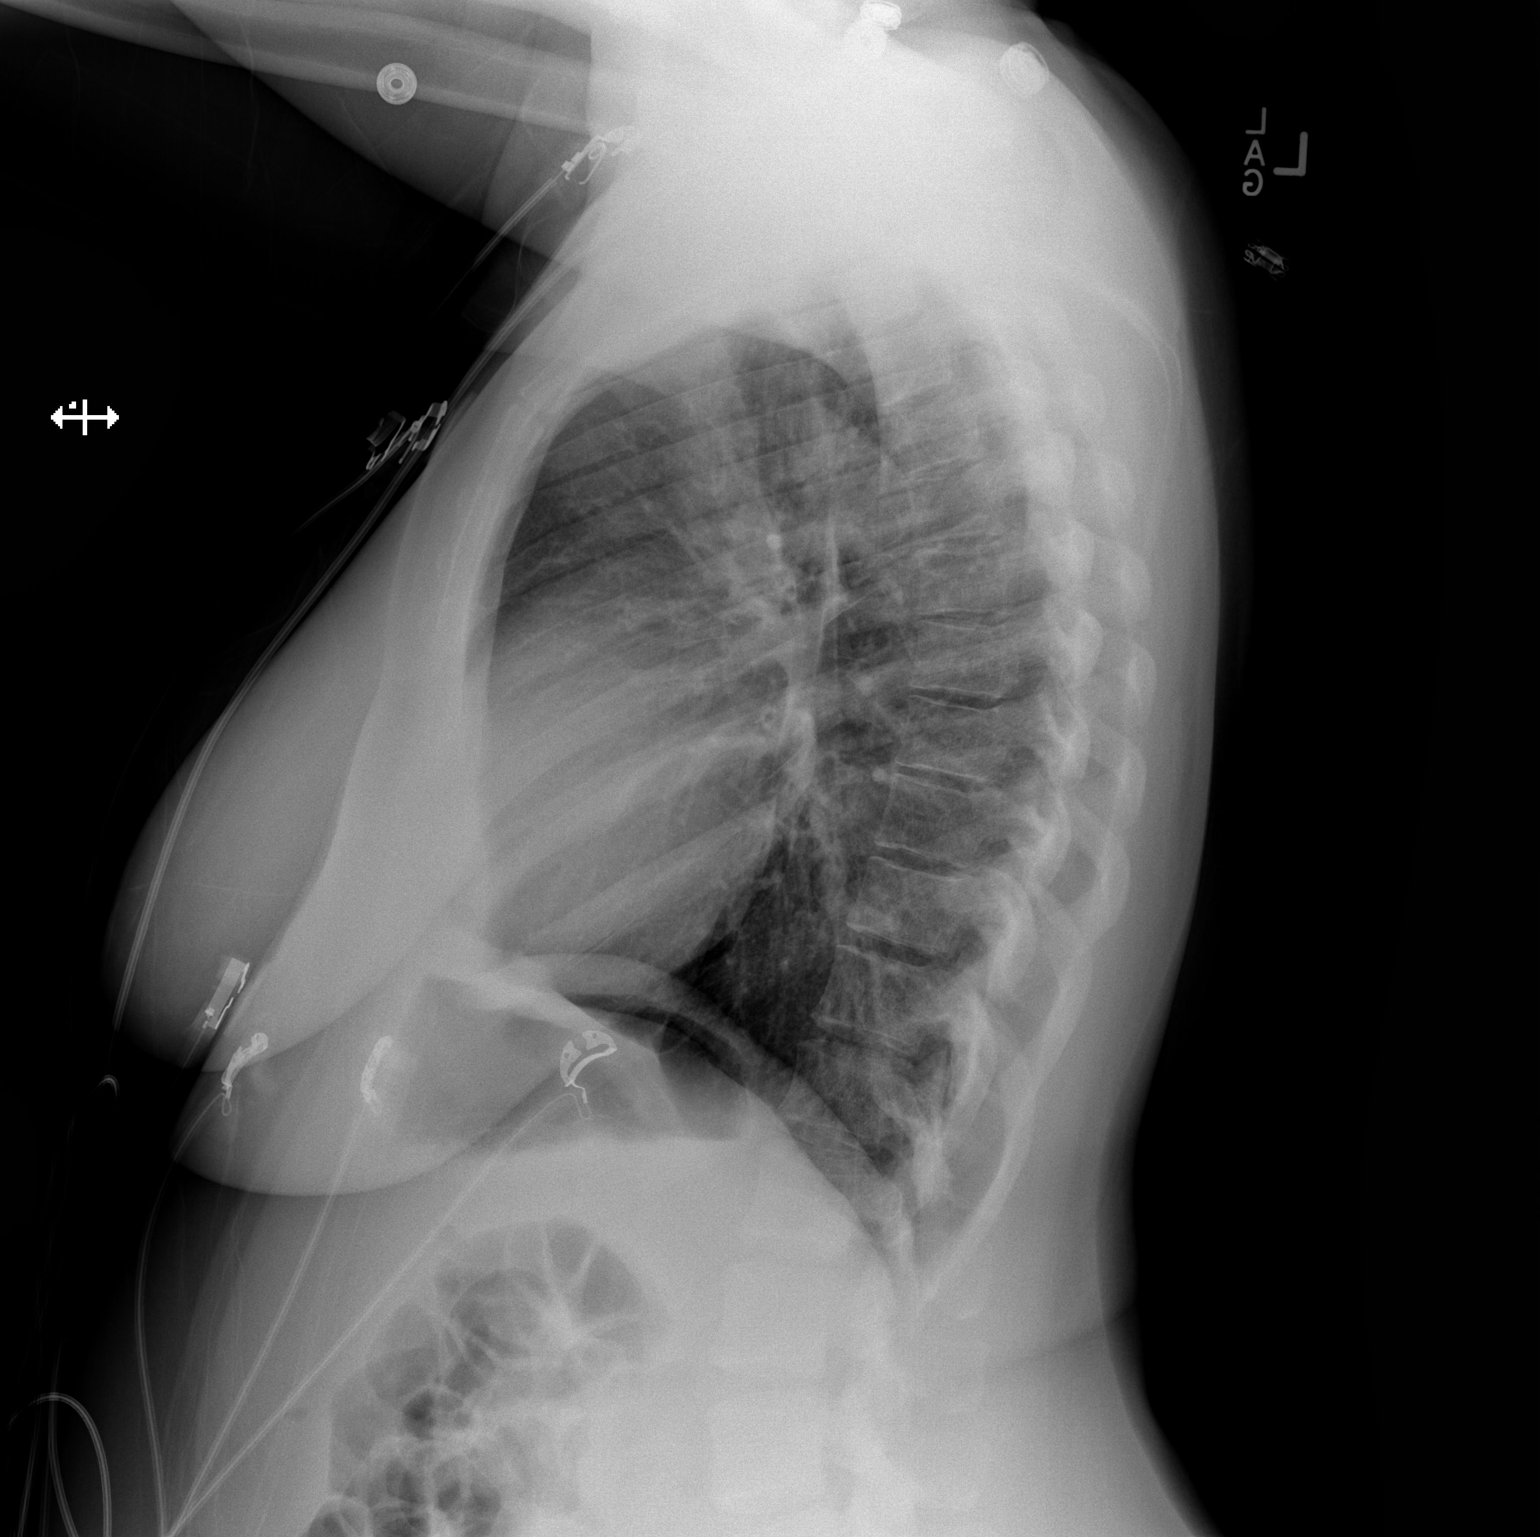

[2 of 2 positions shown; findings below may reference images not displayed]

FINDINGS: Normal sized heart. Clear lungs. Mild peribronchial thickening.
Minimal scoliosis.
IMPRESSION: Mild bronchitic changes.

## 2019-11-07 ENCOUNTER — Ambulatory Visit: Payer: Self-pay | Attending: Internal Medicine

## 2019-11-07 DIAGNOSIS — Z23 Encounter for immunization: Secondary | ICD-10-CM

## 2019-11-07 NOTE — Progress Notes (Signed)
   Covid-19 Vaccination Clinic  Name:  Stefan Blehm    MRN: [id number] DOB: 04-Sep-1980  11/07/2019  Ms. Sawada was observed post Covid-19 immunization for 15 minutes without incident. She was provided with Vaccine Information Sheet and instruction to access the V-Safe system.   Ms. Delany was instructed to call 911 with any severe reactions post vaccine: Marland Kitchen Difficulty breathing  . Swelling of face and throat  . A fast heartbeat  . A bad rash all over body  . Dizziness and weakness   Immunizations Administered    Name Date Dose VIS Date Route   Pfizer COVID-19 Vaccine 11/07/2019  1:36 PM 0.3 mL 07/17/2019 Intramuscular   Manufacturer: [employer], [employer]   Lot: [id number]   [place]: [id number]

## 2019-12-02 ENCOUNTER — Ambulatory Visit: Payer: Self-pay | Attending: Internal Medicine

## 2019-12-02 DIAGNOSIS — Z23 Encounter for immunization: Secondary | ICD-10-CM

## 2019-12-02 NOTE — Progress Notes (Signed)
   Covid-19 Vaccination Clinic  Name:  Sherri Shaw    MRN: [id number] DOB: 05-Dec-1980  12/02/2019  [name] was observed post Covid-19 immunization for 15 minutes without incident. She was provided with Vaccine Information Sheet and instruction to access the V-Safe system.   [name] was instructed to call 911 with any severe reactions post vaccine: Mar[place] Kitchen Difficulty breathing  . Swelling of face and throat  . A fast heartbeat  . A bad rash all over body  . Dizziness and weakness   Immunizations Administered    Name Date Dose VIS Date Route   Pfizer COVID-19 Vaccine 12/02/2019  2:03 PM 0.3 mL 09/30/2018 Intramuscular   Manufacturer: [place]   Lot: [id number]   [place]: [id number]

## 2019-12-09 ENCOUNTER — Telehealth: Payer: Self-pay | Admitting: Family Medicine

## 2019-12-09 MED FILL — !VENTOLIN HFA INHALER: 108 (90 BAS | 16 days supply | Qty: 18 | Fill #0

## 2019-12-09 NOTE — Telephone Encounter (Signed)
Called and spoke w/ patient sister said that an appt was made for patient come to discuss refills for medication.

## 2019-12-10 ENCOUNTER — Other Ambulatory Visit: Payer: Self-pay

## 2019-12-10 ENCOUNTER — Ambulatory Visit (INDEPENDENT_AMBULATORY_CARE_PROVIDER_SITE_OTHER): Payer: Self-pay | Admitting: Nurse Practitioner

## 2019-12-10 ENCOUNTER — Encounter: Payer: Self-pay | Admitting: Nurse Practitioner

## 2019-12-10 VITALS — BP 116/79 | HR 66 | Temp 98.5°F | Ht 62.0 in | Wt 163.0 lb

## 2019-12-10 DIAGNOSIS — Z0000 Encounter for general adult medical examination without abnormal findings: Secondary | ICD-10-CM

## 2019-12-10 DIAGNOSIS — R8290 Unspecified abnormal findings in urine: Secondary | ICD-10-CM

## 2019-12-10 DIAGNOSIS — J4530 Mild persistent asthma, uncomplicated: Secondary | ICD-10-CM

## 2019-12-10 LAB — POCT URINALYSIS DIPSTICK
Bilirubin, UA: NEGATIVE
Glucose, UA: NEGATIVE
Ketones, UA: NEGATIVE
Leukocytes, UA: NEGATIVE
Nitrite, UA: NEGATIVE
Protein, UA: NEGATIVE
Spec Grav, UA: >=1.03 — AB (ref 1.010–1.025)
Urobilinogen, UA: 1 E.U./dL
pH, UA: 5.5 (ref 5.0–8.0)

## 2019-12-10 MED ORDER — ALBUTEROL SULFATE HFA 108 (90 BASE) MCG/ACT IN AERS: 2.0000 | INHALATION_SPRAY | RESPIRATORY_TRACT | 12 refills | Status: DC | PRN

## 2019-12-10 MED ORDER — IPRATROPIUM BROMIDE 0.02 % IN SOLN: 0.5000 mg | Freq: Four times a day (QID) | RESPIRATORY_TRACT | 11 refills | Status: DC | PRN

## 2019-12-10 MED ORDER — MONTELUKAST SODIUM 10 MG PO TABS: 10.0000 mg | ORAL_TABLET | Freq: Every day | ORAL | 3 refills | Status: DC

## 2019-12-10 MED FILL — MONTELUKAST SOD 10 MG TAB: 10 | 30 days supply | Qty: 30 | Fill #0

## 2019-12-10 MED FILL — IPRATROPIUM BR 0.02% SOLN: 0.02 | 18 days supply | Qty: 188 | Fill #0

## 2019-12-10 NOTE — Patient Instructions (Signed)
Asthma, Adult  Asthma is a long-term (chronic) condition in which the airways get tight and narrow. The airways are the breathing passages that lead from the nose and mouth down into the lungs. A person with asthma will have times when symptoms get worse. These are called asthma attacks. They can cause coughing, whistling sounds when you breathe (wheezing), shortness of breath, and chest pain. They can make it hard to breathe. There is no cure for asthma, but medicines and lifestyle changes can help control it. There are many things that can bring on an asthma attack or make asthma symptoms worse (triggers). Common triggers include:  Mold.  Dust.  Cigarette smoke.  Cockroaches.  Things that can cause allergy symptoms (allergens). These include animal skin flakes (dander) and pollen from trees or grass.  Things that pollute the air. These may include household cleaners, wood smoke, smog, or chemical odors.  Cold air, weather changes, and wind.  Crying or laughing hard.  Stress.  Certain medicines or drugs.  Certain foods such as dried fruit, potato chips, and grape juice.  Infections, such as a cold or the flu.  Certain medical conditions or diseases.  Exercise or tiring activities. Asthma may be treated with medicines and by staying away from the things that cause asthma attacks. Types of medicines may include:  Controller medicines. These help prevent asthma symptoms. They are usually taken every day.  Fast-acting reliever or rescue medicines. These quickly relieve asthma symptoms. They are used as needed and provide short-term relief.  Allergy medicines if your attacks are brought on by allergens.  Medicines to help control the body's defense (immune) system. Follow these instructions at home: Avoiding triggers in your home  Change your heating and air conditioning filter often.  Limit your use of fireplaces and wood stoves.  Get rid of pests (such as roaches and  mice) and their droppings.  Throw away plants if you see mold on them.  Clean your floors. Dust regularly. Use cleaning products that do not smell.  Have someone vacuum when you are not home. Use a vacuum cleaner with a HEPA filter if possible.  Replace carpet with wood, tile, or vinyl flooring. Carpet can trap animal skin flakes and dust.  Use allergy-proof pillows, mattress covers, and box spring covers.  Wash bed sheets and blankets every week in hot water. Dry them in a dryer.  Keep your bedroom free of any triggers.  Avoid pets and keep windows closed when things that cause allergy symptoms are in the air.  Use blankets that are made of polyester or cotton.  Clean bathrooms and kitchens with bleach. If possible, have someone repaint the walls in these rooms with mold-resistant paint. Keep out of the rooms that are being cleaned and painted.  Wash your hands often with soap and water. If soap and water are not available, use hand sanitizer.  Do not allow anyone to smoke in your home. General instructions  Take over-the-counter and prescription medicines only as told by your doctor. ? Talk with your doctor if you have questions about how or when to take your medicines. ? Make note if you need to use your medicines more often than usual.  Do not use any products that contain nicotine or tobacco, such as cigarettes and e-cigarettes. If you need help quitting, ask your doctor.  Stay away from secondhand smoke.  Avoid doing things outdoors when allergen counts are high and when air quality is low.  Wear a ski mask   when doing outdoor activities in the winter. The mask should cover your nose and mouth. Exercise indoors on cold days if you can.  Warm up before you exercise. Take time to cool down after exercise.  Use a peak flow meter as told by your doctor. A peak flow meter is a tool that measures how well the lungs are working.  Keep track of the peak flow meter's readings.  Write them down.  Follow your asthma action plan. This is a written plan for taking care of your asthma and treating your attacks.  Make sure you get all the shots (vaccines) that your doctor recommends. Ask your doctor about a flu shot and a pneumonia shot.  Keep all follow-up visits as told by your doctor. This is important. Contact a doctor if:  You have wheezing, shortness of breath, or a cough even while taking medicine to prevent attacks.  The mucus you cough up (sputum) is thicker than usual.  The mucus you cough up changes from clear or white to yellow, green, gray, or bloody.  You have problems from the medicine you are taking, such as: ? A rash. ? Itching. ? Swelling. ? Trouble breathing.  You need reliever medicines more than 2-3 times a week.  Your peak flow reading is still at 50-79% of your personal best after following the action plan for 1 hour.  You have a fever. Get help right away if:  You seem to be worse and are not responding to medicine during an asthma attack.  You are short of breath even at rest.  You get short of breath when doing very little activity.  You have trouble eating, drinking, or talking.  You have chest pain or tightness.  You have a fast heartbeat.  Your lips or fingernails start to turn blue.  You are light-headed or dizzy, or you faint.  Your peak flow is less than 50% of your personal best.  You feel too tired to breathe normally. Summary  Asthma is a long-term (chronic) condition in which the airways get tight and narrow. An asthma attack can make it hard to breathe.  Asthma cannot be cured, but medicines and lifestyle changes can help control it.  Make sure you understand how to avoid triggers and how and when to use your medicines. This information is not intended to replace advice given to you by your health care provider. Make sure you discuss any questions you have with your health care provider. Document Revised:  09/25/2018 Document Reviewed: 08/27/2016 Elsevier Patient Education  2020 [employer]  

## 2019-12-10 NOTE — Progress Notes (Signed)
/  Established Patient Office Visit  Subjective:  Patient ID: Sherri Shaw, female    DOB: 09-19-1980  Age: 39 y.o. MRN: [id number]  CC:  Chief Complaint  Patient presents with  . Follow-up    Asthma    HPI Sherri Shaw presents for follow up.  She is in today with her sister/caregiver.  She  has a past medical history of Anemia, Asthma, Bipolar disorder ([place]), Chronic bronchitis ([place]), Exertional shortness of breath, History of blood transfusion (04/07/2013), Microcytic anemia, and Schizophrenia ([place]).    Asthma Patient presents for evaluation of dyspnea, non-productive cough and wheezing. The patient has been previously diagnosed with asthma. Symptoms currently include dyspnea, non-productive cough and wheezing and occur Intermittently. Observed precipitants include: no identifiable factor. Current limitations in activity from asthma: none. Does she do nebulizer treatments? Yes Does she use an inhaler? yes  Past Medical History:  Diagnosis Date  . Anemia   . Asthma   . Bipolar disorder [facility])    sister denies this hx on 04/07/2013  . Chronic bronchitis ([place])    "I get it q yr" (04/07/2013)  . Exertional shortness of breath    ? due to anemia  . History of blood transfusion 04/07/2013   "got my first transfusion today; got 4 units" (04/07/2013)  . Microcytic anemia    Archie Endo 04/07/2013  . Schizophrenia ([place])    "don't really know the type"/sister (04/07/2013)    Past Surgical History:  Procedure Laterality Date  . ABDOMINAL HYSTERECTOMY N/A 06/01/2013   Procedure: HYSTERECTOMY ABDOMINAL;  Surgeon: Lavonia Drafts, MD;  Location: [facility];  Service: Gynecology;  Laterality: N/A;  . BILATERAL SALPINGECTOMY Bilateral 06/01/2013   Procedure: BILATERAL SALPINGECTOMY;  Surgeon: Lavonia Drafts, MD;  Location: [place] [facility];  Service: Gynecology;  Laterality: Bilateral;  . CYSTOSCOPY W/ URETERAL STENT PLACEMENT Bilateral 06/01/2013   Procedure: CYSTOSCOPY WITH BILATERAL STENT REPLACEMENT;   Surgeon: Irine Seal, MD;  Location: [place] [facility];  Service: Urology;  Laterality: Bilateral;  . HYSTERECTOMY ABDOMINAL WITH SALPINGECTOMY    . NO PAST SURGERIES      Family History  Problem Relation Age of Onset  . Hypertension Mother   . Diabetes Mother   . Asthma Mother   . Stroke Mother   . Cancer Mother   . Asthma Brother   . Heart disease Maternal Aunt     Social History   Socioeconomic History  . Marital status: Single    Spouse name: Not on file  . Number of children: Not on file  . Years of education: Coll  . Highest education level: Not on file  Occupational History  . Not on file  Tobacco Use  . Smoking status: Never Smoker  . Smokeless tobacco: Never Used  Substance and Sexual Activity  . Alcohol use: No  . Drug use: No  . Sexual activity: Not Currently  Other Topics Concern  . 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 Difficulty of Paying Living Expenses:   Food Insecurity:   . Worried About Charity fundraiser in the Last Year:   . Arboriculturist in the Last Year:   Transportation Needs:   . Film/video editor (Medical):   Marland Kitchen Lack of Transportation (Non-Medical):   Physical Activity:   . Days of Exercise per Week:   . Minutes  of Exercise per Session:   Stress:   . Feeling of Stress :   Social Connections:   . Frequency of Communication with Friends and Family:   . Frequency of Social Gatherings with Friends and Family:   . Attends Religious Services:   . Active Member of Clubs or Organizations:   . Attends Archivist Meetings:   Marland Kitchen Marital Status:   Intimate Partner Violence:   . Fear of Current or Ex-Partner:   . Emotionally Abused:   Marland Kitchen Physically Abused:   . Sexually Abused:     Outpatient Medications Prior to Visit  Medication Sig Dispense Refill  . albuterol (VENTOLIN HFA) 108  (90 Base) MCG/ACT inhaler Inhale 2 puffs into the lungs every 4 (four) hours as needed for wheezing or shortness of breath. 8 g 12  . ipratropium (ATROVENT) 0.02 % nebulizer solution Take 2.5 mLs (0.5 mg total) by nebulization every 6 (six) hours as needed for wheezing or shortness of breath. 75 mL 11  . albuterol (PROVENTIL) (2.5 MG/3ML) 0.083% nebulizer solution Take 3 mLs (2.5 mg total) by nebulization every 6 (six) hours as needed for up to 30 days for wheezing or shortness of breath. 75 mL 11   No facility-administered medications prior to visit.    No Known Allergies  ROS Review of Systems  All other systems reviewed and are negative.     Objective:    Physical Exam  Constitutional: She is oriented to person, place, and time.  Obese   Cardiovascular: Normal rate, regular rhythm, normal heart sounds and intact distal pulses.  Pulmonary/Chest: Effort normal and breath sounds normal. No respiratory distress. She has no wheezes. She has no rales. She exhibits no tenderness.  Abdominal: Soft. Bowel sounds are normal.  Musculoskeletal:        General: Normal range of motion.     Cervical back: Normal range of motion.  Neurological: She is alert and oriented to person, place, and time.  Skin: Skin is warm and dry.  Psychiatric:  Very short answers provided    BP 116/79   Pulse 66   Temp 98.5 F (36.9 C)   Ht 5\' 2"  (1.575 m)   Wt 163 lb (73.9 kg)   LMP 05/25/2013 (LMP Unknown)   SpO2 100%   BMI 29.81 kg/m  Wt Readings from Last 3 Encounters:  12/10/19 163 lb (73.9 kg)  06/30/19 153 lb 12.8 oz (69.8 kg)  11/26/18 149 lb (67.6 kg)     There are no preventive care reminders to display for this patient.  There are no preventive care reminders to display for this patient.  Lab Results  Component Value Date   TSH 1.750 12/10/2019   Lab Results  Component Value Date   WBC 6.6 12/10/2019   HGB 12.0 12/10/2019   HCT 36.9 12/10/2019   MCV 88 12/10/2019   PLT 255  12/10/2019   Lab Results  Component Value Date   NA 138 12/10/2019   K 3.8 12/10/2019   CO2 25 11/26/2018   GLUCOSE 80 12/10/2019   BUN 13 12/10/2019   CREATININE 0.83 12/10/2019   BILITOT 0.3 12/10/2019   ALKPHOS 58 12/10/2019   AST 15 12/10/2019   ALT 8 11/26/2018   PROT 7.6 12/10/2019   ALBUMIN 4.3 12/10/2019   CALCIUM 9.2 12/10/2019   ANIONGAP 11 10/14/2018   Lab Results  Component Value Date   CHOL 191 12/10/2019   Lab Results  Component Value Date   HDL 83  12/10/2019   Lab Results  Component Value Date   LDLCALC 100 (H) 12/10/2019   Lab Results  Component Value Date   TRIG 38 12/10/2019   Lab Results  Component Value Date   CHOLHDL 2.3 12/10/2019   Lab Results  Component Value Date   HGBA1C 4.6 06/30/2019      Assessment & Plan:   Problem List Items Addressed This Visit    None    Visit Diagnoses    Abnormal urinalysis    -  Primary   Healthcare maintenance       Relevant Orders   POCT urinalysis dipstick (Completed)   CBC with Differential/Platelet (Completed)   Comp. Metabolic Panel (12) (Completed)   Lipid panel (Completed)   TSH (Completed)   VITAMIN D 25 Hydroxy (Vit-D Deficiency, Fractures) (Completed)   Vitamin B12 (Completed)   Magnesium (Completed)   Mild persistent asthma without complication       Relevant Medications   montelukast (SINGULAIR) 10 MG tablet   albuterol (VENTOLIN HFA) 108 (90 Base) MCG/ACT inhaler   ipratropium (ATROVENT) 0.02 % nebulizer solution      Meds ordered this encounter  Medications  . montelukast (SINGULAIR) 10 MG tablet    Sig: Take 1 tablet (10 mg total) by mouth at bedtime.    Dispense:  30 tablet    Refill:  3    Order Specific Question:   Supervising Provider    Answer:   Tresa Garter [id number]  . albuterol (VENTOLIN HFA) 108 (90 Base) MCG/ACT inhaler    Sig: Inhale 2 puffs into the lungs every 4 (four) hours as needed for wheezing or shortness of breath.    Dispense:  8 g     Refill:  12    Order Specific Question:   Supervising Provider    Answer:   Tresa Garter [id number]  . ipratropium (ATROVENT) 0.02 % nebulizer solution    Sig: Take 2.5 mLs (0.5 mg total) by nebulization every 6 (six) hours as needed for wheezing or shortness of breath.    Dispense:  75 mL    Refill:  11    Order Specific Question:   Supervising Provider    Answer:   Tresa Garter [id number]    Follow-up: Return in about 2 months (around 02/09/2020).    Vevelyn Francois, NP

## 2019-12-11 LAB — COMP. METABOLIC PANEL (12)
AST: 15 IU/L (ref 0–40)
Albumin/Globulin Ratio: 1.3 (ref 1.2–2.2)
Albumin: 4.3 g/dL (ref 3.8–4.8)
Alkaline Phosphatase: 58 IU/L (ref 39–117)
BUN/Creatinine Ratio: 16 (ref 9–23)
BUN: 13 mg/dL (ref 6–24)
Bilirubin Total: 0.3 mg/dL (ref 0.0–1.2)
Calcium: 9.2 mg/dL (ref 8.7–10.2)
Chloride: 101 mmol/L (ref 96–106)
Creatinine, Ser: 0.83 mg/dL (ref 0.57–1.00)
GFR calc Af Amer: 101 mL/min/{1.73_m2} (ref >59)
GFR calc non Af Amer: 88 mL/min/{1.73_m2} (ref >59)
Globulin, Total: 3.3 g/dL (ref 1.5–4.5)
Glucose: 80 mg/dL (ref 65–99)
Potassium: 3.8 mmol/L (ref 3.5–5.2)
Sodium: 138 mmol/L (ref 134–144)
Total Protein: 7.6 g/dL (ref 6.0–8.5)

## 2019-12-11 LAB — MAGNESIUM: Magnesium: 2.2 mg/dL (ref 1.6–2.3)

## 2019-12-11 LAB — CBC WITH DIFFERENTIAL/PLATELET
Basophils Absolute: 0 10*3/uL (ref 0.0–0.2)
Basos: 1 %
EOS (ABSOLUTE): 0.3 10*3/uL (ref 0.0–0.4)
Eos: 5 %
Hematocrit: 36.9 % (ref 34.0–46.6)
Hemoglobin: 12 g/dL (ref 11.1–15.9)
Immature Grans (Abs): 0 10*3/uL (ref 0.0–0.1)
Immature Granulocytes: 0 %
Lymphocytes Absolute: 1.7 10*3/uL (ref 0.7–3.1)
Lymphs: 26 %
MCH: 28.6 pg (ref 26.6–33.0)
MCHC: 32.5 g/dL (ref 31.5–35.7)
MCV: 88 fL (ref 79–97)
Monocytes Absolute: 0.4 10*3/uL (ref 0.1–0.9)
Monocytes: 6 %
Neutrophils Absolute: 4.1 10*3/uL (ref 1.4–7.0)
Neutrophils: 62 %
Platelets: 255 10*3/uL (ref 150–450)
RBC: 4.2 x10E6/uL (ref 3.77–5.28)
RDW: 11.8 % (ref 11.7–15.4)
WBC: 6.6 10*3/uL (ref 3.4–10.8)

## 2019-12-11 LAB — LIPID PANEL
Chol/HDL Ratio: 2.3 ratio (ref 0.0–4.4)
Cholesterol, Total: 191 mg/dL (ref 100–199)
HDL: 83 mg/dL (ref >39)
LDL Chol Calc (NIH): 100 mg/dL — ABNORMAL HIGH (ref 0–99)
Triglycerides: 38 mg/dL (ref 0–149)
VLDL Cholesterol Cal: 8 mg/dL (ref 5–40)

## 2019-12-11 LAB — TSH: TSH: 1.75 u[IU]/mL (ref 0.450–4.500)

## 2019-12-11 LAB — VITAMIN D 25 HYDROXY (VIT D DEFICIENCY, FRACTURES): Vit D, 25-Hydroxy: 6.1 ng/mL — ABNORMAL LOW (ref 30.0–100.0)

## 2019-12-11 LAB — VITAMIN B12: Vitamin B-12: 336 pg/mL (ref 232–1245)

## 2019-12-19 IMAGING — DX PORTABLE CHEST - 1 VIEW
1 series · 1 of 1 positions shown · non-contrast
Comparison: 09/16/2018

CLINICAL DATA: Shortness of breath

EXAM:
PORTABLE CHEST 1 VIEW

[chest]
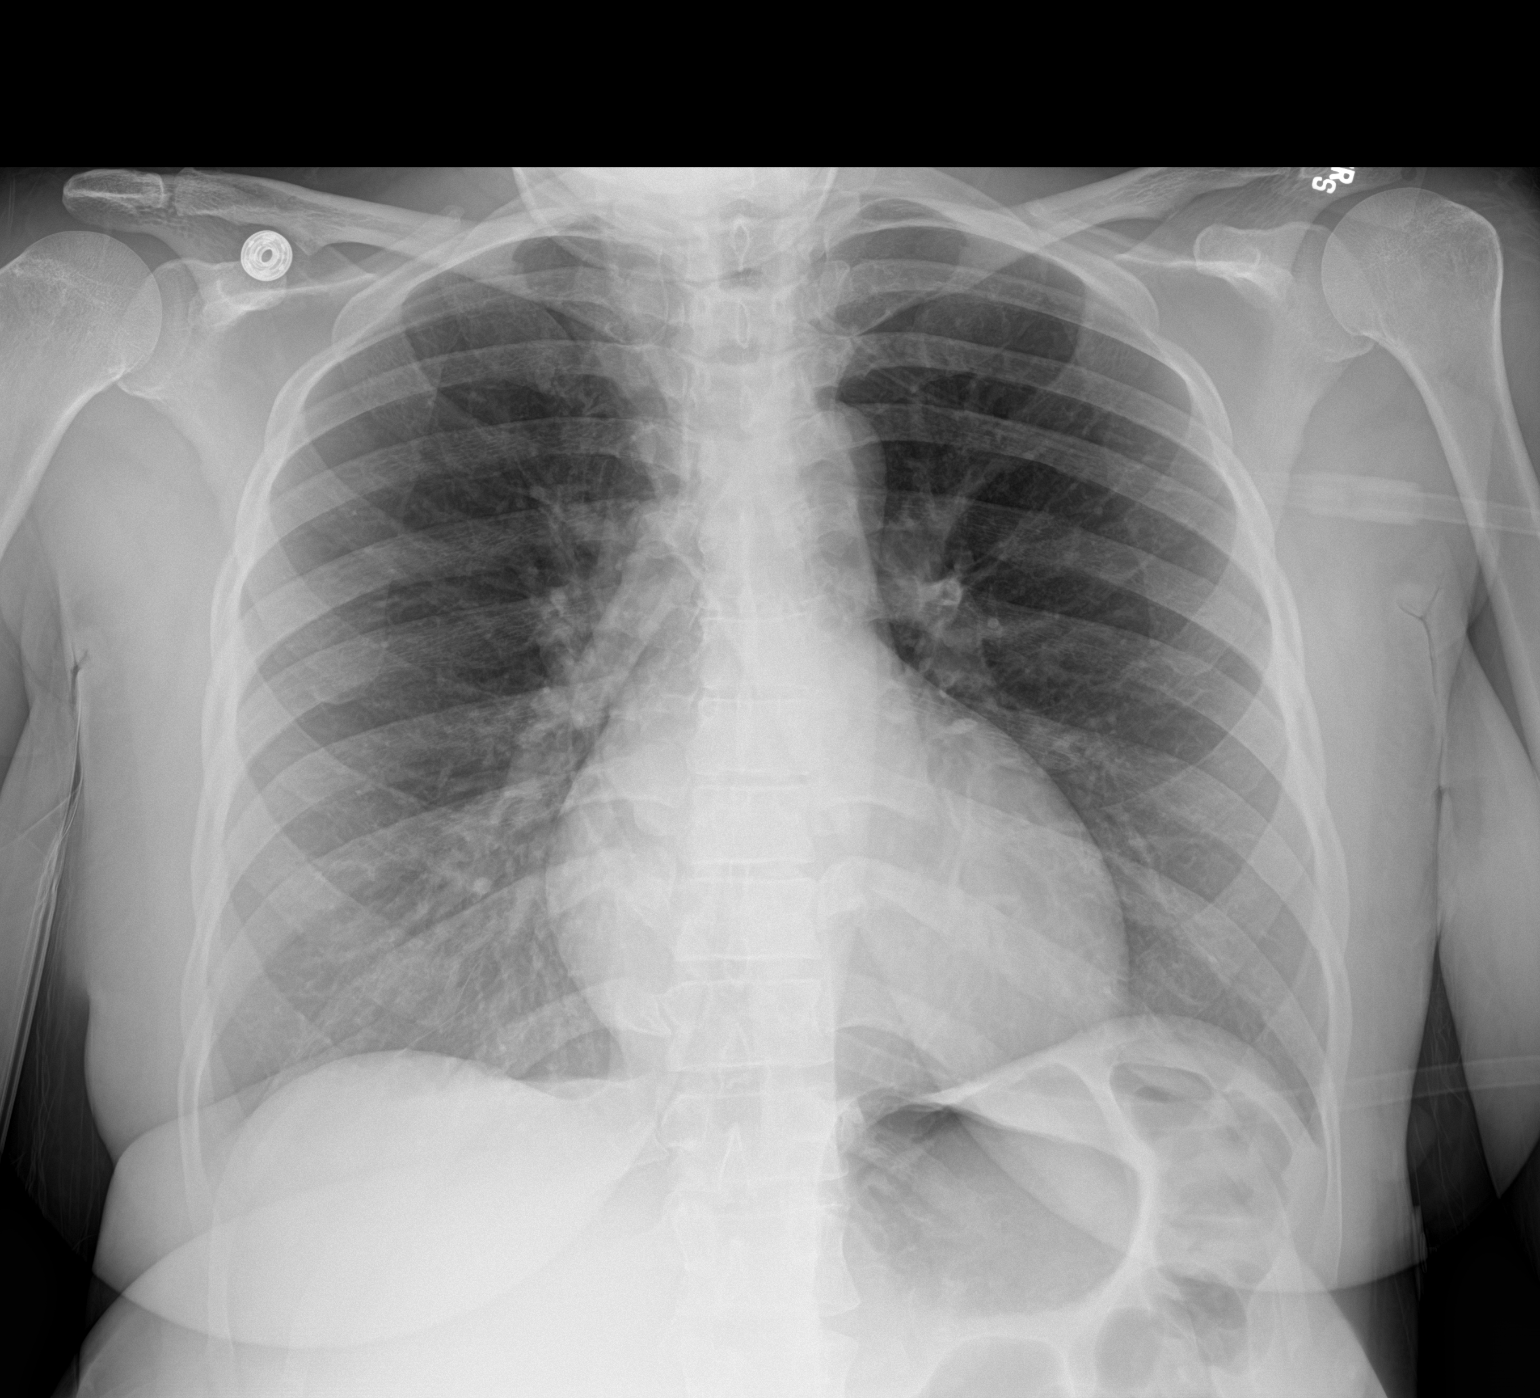

[1 of 1 positions shown; findings below may reference images not displayed]

FINDINGS: The heart size and mediastinal contours are within normal limits.
Both lungs are clear. The visualized skeletal structures are
unremarkable.
IMPRESSION: No active disease.

## 2020-02-11 ENCOUNTER — Other Ambulatory Visit: Payer: Self-pay

## 2020-02-11 ENCOUNTER — Encounter: Payer: Self-pay | Admitting: Nurse Practitioner

## 2020-02-11 ENCOUNTER — Ambulatory Visit (INDEPENDENT_AMBULATORY_CARE_PROVIDER_SITE_OTHER): Payer: Self-pay | Admitting: Nurse Practitioner

## 2020-02-11 VITALS — BP 115/73 | HR 69 | Temp 97.7°F | Ht 62.0 in | Wt 164.0 lb

## 2020-02-11 DIAGNOSIS — Z1231 Encounter for screening mammogram for malignant neoplasm of breast: Secondary | ICD-10-CM

## 2020-02-11 DIAGNOSIS — J4530 Mild persistent asthma, uncomplicated: Secondary | ICD-10-CM

## 2020-02-11 DIAGNOSIS — E559 Vitamin D deficiency, unspecified: Secondary | ICD-10-CM

## 2020-02-11 DIAGNOSIS — Z0000 Encounter for general adult medical examination without abnormal findings: Secondary | ICD-10-CM

## 2020-02-11 MED ORDER — MONTELUKAST SODIUM 10 MG PO TABS: 10.0000 mg | ORAL_TABLET | Freq: Every day | ORAL | 11 refills | Status: DC

## 2020-02-11 MED ORDER — ERGOCALCIFEROL 1.25 MG (50000 UT) PO CAPS: 50000.0000 [IU] | ORAL_CAPSULE | ORAL | 0 refills | Status: AC

## 2020-02-11 MED FILL — VIT D2 1.25 MG (50,000 UNIT: 1.25 MG | 28 days supply | Qty: 4 | Fill #0

## 2020-02-11 MED FILL — MONTELUKAST SOD 10 MG TAB: 10 | 30 days supply | Qty: 30 | Fill #0

## 2020-02-11 NOTE — Patient Instructions (Signed)
Breast Screening Issues for Women With Breast Implants  Regular breast screening is an important part of health care for women. Breast screening is usually done by mammogram. A mammogram is a procedure in which an X-ray image of the breast is taken. Abnormalities in the breast, such as growths (tumors) or growths that contain fluid or air (cysts), can be found with a mammogram. Breast implants can make mammograms difficult to do. The implants may affect the result of the mammogram or the evaluation of the results. Issues with mammograms for women with implants  The implant can hide or obscure breast tumors.  The implant, or scar tissue that has formed around the breast implant, may make it difficult to press the breast firmly enough during the exam.  Reading of the mammogram by the radiologist may be difficult because the implant can make it hard to see the underlying breast tissue.  The implant may break open during a mammogram (rare).  Fear that the implant may break during a mammogram may discourage women from having the test.  Extra pictures may need to be taken during a mammogram to provide a better view of breast tissue. Alternative screening tests for women with breast implants Other tests that can be done for women with breast implants include:  MRI. An MRI allows your health care provider to see through your implants to the breast tissue and chest muscles. MRI is an excellent technique for detecting broken or leaking implants.  Ultrasound. This uses sound waves to detect broken and leaking implants. An exam by your health care provider should be done yearly. This information is not intended to replace advice given to you by your health care provider. Make sure you discuss any questions you have with your health care provider. Document Revised: 02/26/2017 Document Reviewed: 04/12/2016 Elsevier Patient Education  2020 Elsevier Inc.  

## 2020-02-11 NOTE — Progress Notes (Signed)
Sherri Shaw, [place]  [id number]                                                       Phone:  [phone]   Fax:  [phone]   Established Patient Office Visit  Subjective:  Patient ID: Sherri Shaw, female    DOB: 06/30/1981  Age: 39 y.o. MRN: [id number]  CC:  Chief Complaint  Patient presents with   Follow-up    2 month follow up, no concerns    HPI Sherri Shaw presents for follow up. She  has a past medical history of Anemia, Asthma, Bipolar disorder ([place]), Chronic bronchitis ([place]), Exertional shortness of breath, History of blood transfusion (04/07/2013), Microcytic anemia, and Schizophrenia ([place]).   Asthma Patient presents for evaluation of dyspnea and wheezing. The patient has been previously diagnosed with asthma. Symptoms currently include dyspnea, non-productive cough and wheezing and occur daily. Observed precipitants include: no identifiable factor. Current limitations in activity from asthma: none. Number of days of school or work missed in the last month: not applicable.  She was started on Singulair 2 months ago and she admits that it is very effective.  She is not having to use her albuterol as often.  She is very pleased.  She denies any side effects.  Does she do nebulizer treatments? yes Does she use an inhaler? yes Does she use a spacer with MDIs? no Does she monitor peak flow rates? no    Past Medical History:  Diagnosis Date   Anemia    Asthma    Bipolar disorder ([facility])    sister denies this hx on 04/07/2013   Chronic bronchitis ([place])    "I get it q yr" (04/07/2013)   Exertional shortness of breath    ? due to anemia   History of blood transfusion 04/07/2013   "got my first transfusion today; got 4 units" (04/07/2013)   Microcytic anemia    Archie Endo 04/07/2013   Schizophrenia ([place])    "don't really know the type"/sister (04/07/2013)    Past Surgical History:  Procedure Laterality Date   ABDOMINAL  HYSTERECTOMY N/A 06/01/2013   Procedure: HYSTERECTOMY ABDOMINAL;  Surgeon: Lavonia Drafts, MD;  Location: [place] [facility];  Service: Gynecology;  Laterality: N/A;   BILATERAL SALPINGECTOMY Bilateral 06/01/2013   Procedure: BILATERAL SALPINGECTOMY;  Surgeon: Lavonia Drafts, MD;  Location: [place] [facility];  Service: Gynecology;  Laterality: Bilateral;   CYSTOSCOPY W/ URETERAL STENT PLACEMENT Bilateral 06/01/2013   Procedure: CYSTOSCOPY WITH BILATERAL STENT REPLACEMENT;  Surgeon: Irine Seal, MD;  Location: [place] [facility];  Service: Urology;  Laterality: Bilateral;   HYSTERECTOMY ABDOMINAL WITH SALPINGECTOMY     NO PAST SURGERIES      Family History  Problem Relation Age of Onset   Hypertension Mother    Diabetes Mother    Asthma Mother    Stroke Mother    Cancer Mother    Asthma Brother    Heart disease Maternal Aunt     Social History   Socioeconomic History   Marital status: Single    Spouse name: Not on file   Number of children: Not on file   Years of education: Coll   Highest education level: Not on file  Occupational History   Not on file  Tobacco Use  Smoking status: Never Smoker   Smokeless tobacco: Never Used  Vaping Use   Vaping Use: Never used  Substance and Sexual Activity   Alcohol use: No   Drug use: No   Sexual activity: Not Currently  Other Topics Concern   Not on file  Social History Narrative   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   Has 5 siblings; lives with sister   Social Determinants of Health   Financial Resource Strain:    Difficulty of Paying Living Expenses:   Food Insecurity:    Worried About Charity fundraiser in the Last Year:    Arboriculturist in the Last Year:   Transportation Needs:    Film/video editor (Medical):    Lack of Transportation (Non-Medical):   Physical Activity:    Days of Exercise per Week:    Minutes of Exercise per Session:   Stress:    Feeling of Stress :   Social  Connections:    Frequency of Communication with Friends and Family:    Frequency of Social Gatherings with Friends and Family:    Attends Religious Services:    Active Member of Clubs or Organizations:    Attends Music therapist:    Marital Status:   Intimate Partner Violence:    Fear of Current or Ex-Partner:    Emotionally Abused:    Physically Abused:    Sexually Abused:     Outpatient Medications Prior to Visit  Medication Sig Dispense Refill   albuterol (VENTOLIN HFA) 108 (90 Base) MCG/ACT inhaler Inhale 2 puffs into the lungs every 4 (four) hours as needed for wheezing or shortness of breath. 8 g 12   ipratropium (ATROVENT) 0.02 % nebulizer solution Take 2.5 mLs (0.5 mg total) by nebulization every 6 (six) hours as needed for wheezing or shortness of breath. 75 mL 11   montelukast (SINGULAIR) 10 MG tablet Take 1 tablet (10 mg total) by mouth at bedtime. 30 tablet 3   No facility-administered medications prior to visit.    No Known Allergies  ROS Review of Systems  All other systems reviewed and are negative.     Objective:    Physical Exam Vitals reviewed.  Constitutional:      General: She is not in acute distress.    Appearance: Normal appearance. She is not ill-appearing or toxic-appearing.  HENT:     Head: Normocephalic.     Nose: Nose normal.     Mouth/Throat:     Mouth: Mucous membranes are moist.  Cardiovascular:     Rate and Rhythm: Normal rate and regular rhythm.     Pulses: Normal pulses.     Heart sounds: Normal heart sounds.  Pulmonary:     Effort: Pulmonary effort is normal.     Breath sounds: Normal breath sounds.  Abdominal:     General: Bowel sounds are normal.     Palpations: Abdomen is soft.  Musculoskeletal:        General: Normal range of motion.     Cervical back: Normal range of motion.  Skin:    General: Skin is warm.     Capillary Refill: Capillary refill takes less than 2 seconds.  Neurological:      Mental Status: She is alert and oriented to person, place, and time.  Psychiatric:        Mood and Affect: Mood normal.        Behavior: Behavior normal.  Thought Content: Thought content normal.        Judgment: Judgment normal.     BP 115/73    Pulse 69    Temp 97.7 F (36.5 C)    Ht 5\' 2"  (1.575 m)    Wt 164 lb 0.4 oz (74.4 kg)    LMP 05/25/2013 (LMP Unknown)    SpO2 100%    BMI 30.00 kg/m  Wt Readings from Last 3 Encounters:  02/11/20 164 lb 0.4 oz (74.4 kg)  12/10/19 163 lb (73.9 kg)  06/30/19 153 lb 12.8 oz (69.8 kg)     There are no preventive care reminders to display for this patient.  There are no preventive care reminders to display for this patient.  Lab Results  Component Value Date   TSH 1.750 12/10/2019   Lab Results  Component Value Date   WBC 6.6 12/10/2019   HGB 12.0 12/10/2019   HCT 36.9 12/10/2019   MCV 88 12/10/2019   PLT 255 12/10/2019   Lab Results  Component Value Date   NA 138 12/10/2019   K 3.8 12/10/2019   CO2 25 11/26/2018   GLUCOSE 80 12/10/2019   BUN 13 12/10/2019   CREATININE 0.83 12/10/2019   BILITOT 0.3 12/10/2019   ALKPHOS 58 12/10/2019   AST 15 12/10/2019   ALT 8 11/26/2018   PROT 7.6 12/10/2019   ALBUMIN 4.3 12/10/2019   CALCIUM 9.2 12/10/2019   ANIONGAP 11 10/14/2018   Lab Results  Component Value Date   CHOL 191 12/10/2019   Lab Results  Component Value Date   HDL 83 12/10/2019   Lab Results  Component Value Date   LDLCALC 100 (H) 12/10/2019   Lab Results  Component Value Date   TRIG 38 12/10/2019   Lab Results  Component Value Date   CHOLHDL 2.3 12/10/2019   Lab Results  Component Value Date   HGBA1C 4.6 06/30/2019      Assessment & Plan:   Problem List Items Addressed This Visit    None    Visit Diagnoses    Mild persistent asthma without complication    -  Primary   Relevant Medications   montelukast (SINGULAIR) 10 MG tablet   Vitamin D deficiency       Encounter for screening  mammogram for malignant neoplasm of breast       Relevant Orders   Ambulatory Referral to [place] maintenance       Relevant Orders   Ambulatory Referral to [employer] ordered this encounter  Medications   ergocalciferol (VITAMIN D2) 1.25 MG (50000 UT) capsule    Sig: Take 1 capsule (50,000 Units total) by mouth once a week. X 12 weeks.    Dispense:  12 capsule    Refill:  0    Do not add this medication to the electronic "Automatic Refill" notification system. Patient may have prescription filled one day early if pharmacy is closed on scheduled refill date.   montelukast (SINGULAIR) 10 MG tablet    Sig: Take 1 tablet (10 mg total) by mouth at bedtime.    Dispense:  30 tablet    Refill:  11    Follow-up: Return in about 3 months (around 05/13/2020).    Vevelyn Francois, NP

## 2020-05-13 ENCOUNTER — Ambulatory Visit (INDEPENDENT_AMBULATORY_CARE_PROVIDER_SITE_OTHER): Payer: Self-pay | Admitting: Nurse Practitioner

## 2020-05-13 ENCOUNTER — Encounter: Payer: Self-pay | Admitting: Nurse Practitioner

## 2020-05-13 ENCOUNTER — Other Ambulatory Visit: Payer: Self-pay

## 2020-05-13 VITALS — BP 115/73 | HR 82 | Temp 99.1°F | Ht <= 58 in

## 2020-05-13 DIAGNOSIS — Z0000 Encounter for general adult medical examination without abnormal findings: Secondary | ICD-10-CM

## 2020-05-13 DIAGNOSIS — Z23 Encounter for immunization: Secondary | ICD-10-CM

## 2020-05-13 DIAGNOSIS — J4520 Mild intermittent asthma, uncomplicated: Secondary | ICD-10-CM

## 2020-05-13 LAB — POCT URINALYSIS DIP (CLINITEK)
Bilirubin, UA: NEGATIVE
Glucose, UA: NEGATIVE mg/dL
Ketones, POC UA: NEGATIVE mg/dL
Nitrite, UA: NEGATIVE
Spec Grav, UA: 1.025 (ref 1.010–1.025)
Urobilinogen, UA: 2 E.U./dL — AB
pH, UA: 6.5 (ref 5.0–8.0)

## 2020-05-13 MED ORDER — ALBUTEROL SULFATE HFA 108 (90 BASE) MCG/ACT IN AERS: 2.0000 | INHALATION_SPRAY | Freq: Four times a day (QID) | RESPIRATORY_TRACT | 2 refills | Status: DC | PRN

## 2020-05-13 MED FILL — MONTELUKAST SOD 10 MG TAB: 10 | 30 days supply | Qty: 30 | Fill #0

## 2020-05-13 NOTE — Patient Instructions (Signed)
Healthy Eating °Following a healthy eating pattern may help you to achieve and maintain a healthy body weight, reduce the risk of chronic disease, and live a long and productive life. It is important to follow a healthy eating pattern at an appropriate calorie level for your body. Your nutritional needs should be met primarily through food by choosing a variety of nutrient-rich foods. °What are tips for following this plan? °Reading food labels °· Read labels and choose the following: °? Reduced or low sodium. °? Juices with 100% fruit juice. °? Foods with low saturated fats and high polyunsaturated and monounsaturated fats. °? Foods with whole grains, such as whole wheat, cracked wheat, brown rice, and wild rice. °? Whole grains that are fortified with folic acid. This is recommended for women who are pregnant or who want to become pregnant. °· Read labels and avoid the following: °? Foods with a lot of added sugars. These include foods that contain brown sugar, corn sweetener, corn syrup, dextrose, fructose, glucose, high-fructose corn syrup, honey, invert sugar, lactose, malt syrup, maltose, molasses, raw sugar, sucrose, trehalose, or turbinado sugar. °§ Do not eat more than the following amounts of added sugar per day: °§ 6 teaspoons (25 g) for women. °§ 9 teaspoons (38 g) for men. °? Foods that contain processed or refined starches and grains. °? Refined grain products, such as white flour, degermed cornmeal, white bread, and white rice. °Shopping °· Choose nutrient-rich snacks, such as vegetables, whole fruits, and nuts. Avoid high-calorie and high-sugar snacks, such as potato chips, fruit snacks, and candy. °· Use oil-based dressings and spreads on foods instead of solid fats such as butter, stick margarine, or cream cheese. °· Limit pre-made sauces, mixes, and "instant" products such as flavored rice, instant noodles, and ready-made pasta. °· Try more plant-protein sources, such as tofu, tempeh, black beans,  edamame, lentils, nuts, and seeds. °· Explore eating plans such as the Mediterranean diet or vegetarian diet. °Cooking °· Use oil to sauté or stir-fry foods instead of solid fats such as butter, stick margarine, or lard. °· Try baking, boiling, grilling, or broiling instead of frying. °· Remove the fatty part of meats before cooking. °· Steam vegetables in water or broth. °Meal planning ° °· At meals, imagine dividing your plate into fourths: °? One-half of your plate is fruits and vegetables. °? One-fourth of your plate is whole grains. °? One-fourth of your plate is protein, especially lean meats, poultry, eggs, tofu, beans, or nuts. °· Include low-fat dairy as part of your daily diet. °Lifestyle °· Choose healthy options in all settings, including home, work, school, restaurants, or stores. °· Prepare your food safely: °? Wash your hands after handling raw meats. °? Keep food preparation surfaces clean by regularly washing with hot, soapy water. °? Keep raw meats separate from ready-to-eat foods, such as fruits and vegetables. °? Cook seafood, meat, poultry, and eggs to the recommended internal temperature. °? Store foods at safe temperatures. In general: °§ Keep cold foods at 40°F (4.4°C) or below. °§ Keep hot foods at 140°F (60°C) or above. °§ Keep your freezer at 0°F (-17.8°C) or below. °§ Foods are no longer safe to eat when they have been between the temperatures of 40°-140°F (4.4-60°C) for more than 2 hours. °What foods should I eat? °Fruits °Aim to eat 2 cup-equivalents of fresh, canned (in natural juice), or frozen fruits each day. Examples of 1 cup-equivalent of fruit include 1 small apple, 8 large strawberries, 1 cup canned fruit, ½ cup   dried fruit, or 1 cup 100% juice. Vegetables Aim to eat 2-3 cup-equivalents of fresh and frozen vegetables each day, including different varieties and colors. Examples of 1 cup-equivalent of vegetables include 2 medium carrots, 2 cups raw, leafy greens, 1 cup chopped  vegetable (raw or cooked), or 1 medium baked potato. Grains Aim to eat 6 ounce-equivalents of whole grains each day. Examples of 1 ounce-equivalent of grains include 1 slice of bread, 1 cup ready-to-eat cereal, 3 cups popcorn, or  cup cooked rice, pasta, or cereal. Meats and other proteins Aim to eat 5-6 ounce-equivalents of protein each day. Examples of 1 ounce-equivalent of protein include 1 egg, 1/2 cup nuts or seeds, or 1 tablespoon (16 g) peanut butter. A cut of meat or fish that is the size of a deck of cards is about 3-4 ounce-equivalents.  Of the protein you eat each week, try to have at least 8 ounces come from seafood. This includes salmon, trout, herring, and anchovies. Dairy Aim to eat 3 cup-equivalents of fat-free or low-fat dairy each day. Examples of 1 cup-equivalent of dairy include 1 cup (240 mL) milk, 8 ounces (250 g) yogurt, 1 ounces (44 g) natural cheese, or 1 cup (240 mL) fortified soy milk. Fats and oils  Aim for about 5 teaspoons (21 g) per day. Choose monounsaturated fats, such as canola and olive oils, avocados, peanut butter, and most nuts, or polyunsaturated fats, such as sunflower, corn, and soybean oils, walnuts, pine nuts, sesame seeds, sunflower seeds, and flaxseed. Beverages  Aim for six 8-oz glasses of water per day. Limit coffee to three to five 8-oz cups per day.  Limit caffeinated beverages that have added calories, such as soda and energy drinks.  Limit alcohol intake to no more than 1 drink a day for nonpregnant women and 2 drinks a day for men. One drink equals 12 oz of beer (355 mL), 5 oz of wine (148 mL), or 1 oz of hard liquor (44 mL). Seasoning and other foods  Avoid adding excess amounts of salt to your foods. Try flavoring foods with herbs and spices instead of salt.  Avoid adding sugar to foods.  Try using oil-based dressings, sauces, and spreads instead of solid fats. This information is based on general [place] nutrition guidelines. For more  information, visit choosemyplate.gov. Exact amounts may vary based on your nutrition needs. Summary  A healthy eating plan may help you to maintain a healthy weight, reduce the risk of chronic diseases, and stay active throughout your life.  Plan your meals. Make sure you eat the right portions of a variety of nutrient-rich foods.  Try baking, boiling, grilling, or broiling instead of frying.  Choose healthy options in all settings, including home, work, school, restaurants, or stores. This information is not intended to replace advice given to you by your health care provider. Make sure you discuss any questions you have with your health care provider. Document Revised: 11/04/2017 Document Reviewed: 11/04/2017 Elsevier Patient Education  2020 Elsevier Inc.  

## 2020-05-13 NOTE — Progress Notes (Signed)
[place], [place]  [address] Phone:  [phone]   Fax:  [phone]   Established Patient Office Visit  Subjective:  Patient ID: Sherri Shaw, female    DOB: 19-Nov-1980  Age: 39 y.o. MRN: [id number]  CC:  Chief Complaint  Patient presents with   Follow-up    HPI Sherri Shaw presents for follow up. She  has a past medical history of Anemia, Asthma, Bipolar disorder ([place]), Chronic bronchitis ([place]), Exertional shortness of breath, History of blood transfusion (04/07/2013), Microcytic anemia, and Schizophrenia ([place]).   Asthma Patient presents for evaluation of asthma.   The patient has been previously diagnosed with asthma. No Symptoms currently . Observed precipitants include: no identifiable factor. Current limitations in activity from asthma: none. She admits that her symptoms are tolerable. She feels like it is noticed with activity. She is currently not working.   Does she do nebulizer treatments? yes Does she use an inhaler? yes  Past Medical History:  Diagnosis Date   Anemia    Asthma    Bipolar disorder ([place])    sister denies this hx on 04/07/2013   Chronic bronchitis ([place])    "I get it q yr" (04/07/2013)   Exertional shortness of breath    ? due to anemia   History of blood transfusion 04/07/2013   "got my first transfusion today; got 4 units" (04/07/2013)   Microcytic anemia    Archie Endo 04/07/2013   Schizophrenia ([place])    "don't really know the type"/sister (04/07/2013)    Past Surgical History:  Procedure Laterality Date   ABDOMINAL HYSTERECTOMY N/A 06/01/2013   Procedure: HYSTERECTOMY ABDOMINAL;  Surgeon: Lavonia Drafts, MD;  Location: [facility];  Service: Gynecology;  Laterality: N/A;   BILATERAL SALPINGECTOMY Bilateral 06/01/2013   Procedure: BILATERAL SALPINGECTOMY;  Surgeon: Lavonia Drafts, MD;  Location: [facility];  Service: Gynecology;  Laterality: Bilateral;   CYSTOSCOPY W/ URETERAL STENT  PLACEMENT Bilateral 06/01/2013   Procedure: CYSTOSCOPY WITH BILATERAL STENT REPLACEMENT;  Surgeon: Irine Seal, MD;  Location: [facility];  Service: Urology;  Laterality: Bilateral;   HYSTERECTOMY ABDOMINAL WITH SALPINGECTOMY     NO PAST SURGERIES      Family History  Problem Relation Age of Onset   Hypertension Mother    Diabetes Mother    Asthma Mother    Stroke Mother    Cancer Mother    Asthma Brother    Heart disease Maternal Aunt     Social History   Socioeconomic History   Marital status: Single    Spouse name: Not on file   Number of children: Not on file   Years of education: Coll   Highest education level: Not on file  Occupational History   Not on file  Tobacco Use   Smoking status: Never Smoker   Smokeless tobacco: Never Used  Vaping Use   Vaping Use: Never used  Substance and Sexual Activity   Alcohol use: No   Drug use: No   Sexual activity: Not Currently  Other Topics Concern   Not on file  Social History Narrative   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   Has 5 siblings; lives with sister   Social Determinants of Health   Financial Resource Strain:    Difficulty of Paying Living Expenses: Not on file  Food Insecurity:    Worried About Charity fundraiser in the Last Year: Not on file   [employer] of Food in the Last  Year: Not on file  Transportation Needs:    Lack of Transportation (Medical): Not on file   Lack of Transportation (Non-Medical): Not on file  Physical Activity:    Days of Exercise per Week: Not on file   Minutes of Exercise per Session: Not on file  Stress:    Feeling of Stress : Not on file  Social Connections:    Frequency of Communication with Friends and Family: Not on file   Frequency of Social Gatherings with Friends and Family: Not on file   Attends Religious Services: Not on file   Active Member of Clubs or Organizations: Not on file   Attends Archivist Meetings: Not on  file   Marital Status: Not on file  Intimate Partner Violence:    Fear of Current or Ex-Partner: Not on file   Emotionally Abused: Not on file   Physically Abused: Not on file   Sexually Abused: Not on file    Outpatient Medications Prior to Visit  Medication Sig Dispense Refill   ipratropium (ATROVENT) 0.02 % nebulizer solution Take 2.5 mLs (0.5 mg total) by nebulization every 6 (six) hours as needed for wheezing or shortness of breath. 75 mL 11   albuterol (VENTOLIN HFA) 108 (90 Base) MCG/ACT inhaler Inhale 2 puffs into the lungs every 4 (four) hours as needed for wheezing or shortness of breath. 8 g 12   montelukast (SINGULAIR) 10 MG tablet Take 1 tablet (10 mg total) by mouth at bedtime. (Patient not taking: Reported on 05/13/2020) 30 tablet 11   No facility-administered medications prior to visit.    No Known Allergies  ROS Review of Systems  Constitutional: Negative for activity change, chills, fever and unexpected weight change.  Eyes: Negative for visual disturbance.  Respiratory: Negative for cough and shortness of breath.   Cardiovascular: Negative for chest pain and leg swelling.  Gastrointestinal: Negative for constipation, diarrhea, nausea and vomiting.  Endocrine: Negative.   Genitourinary: Negative.   Musculoskeletal: Negative.   Skin: Negative.   Neurological: Negative for dizziness and headaches.  Hematological: Negative.   Psychiatric/Behavioral: Negative.       Objective:    Physical Exam Constitutional:      Appearance: She is obese.  HENT:     Head: Normocephalic and atraumatic.     Nose: Nose normal.     Mouth/Throat:     Mouth: Mucous membranes are moist.  Cardiovascular:     Rate and Rhythm: Normal rate and regular rhythm.     Pulses: Normal pulses.     Heart sounds: Normal heart sounds.  Pulmonary:     Effort: Pulmonary effort is normal.     Breath sounds: Normal breath sounds.  Abdominal:     Palpations: Abdomen is soft.    Musculoskeletal:        General: Normal range of motion.     Cervical back: Normal range of motion.  Skin:    General: Skin is warm and dry.     Capillary Refill: Capillary refill takes less than 2 seconds.  Neurological:     General: No focal deficit present.     Mental Status: She is alert and oriented to person, place, and time.  Psychiatric:        Mood and Affect: Mood normal.        Behavior: Behavior normal.        Thought Content: Thought content normal.        Judgment: Judgment normal.  BP 115/73 (BP Location: Right Arm, Patient Position: Sitting, Cuff Size: Normal)    Pulse 82    Temp 99.1 F (37.3 C) (Temporal)    Ht 4\' 8"  (1.422 m)    LMP 05/25/2013 (LMP Unknown)    SpO2 100%    BMI 36.77 kg/m  Wt Readings from Last 3 Encounters:  02/11/20 164 lb 0.4 oz (74.4 kg)  12/10/19 163 lb (73.9 kg)  06/30/19 153 lb 12.8 oz (69.8 kg)     There are no preventive care reminders to display for this patient.  There are no preventive care reminders to display for this patient.  Lab Results  Component Value Date   TSH 1.750 12/10/2019   Lab Results  Component Value Date   WBC 6.6 12/10/2019   HGB 12.0 12/10/2019   HCT 36.9 12/10/2019   MCV 88 12/10/2019   PLT 255 12/10/2019   Lab Results  Component Value Date   NA 138 12/10/2019   K 3.8 12/10/2019   CO2 25 11/26/2018   GLUCOSE 80 12/10/2019   BUN 13 12/10/2019   CREATININE 0.83 12/10/2019   BILITOT 0.3 12/10/2019   ALKPHOS 58 12/10/2019   AST 15 12/10/2019   ALT 8 11/26/2018   PROT 7.6 12/10/2019   ALBUMIN 4.3 12/10/2019   CALCIUM 9.2 12/10/2019   ANIONGAP 11 10/14/2018   Lab Results  Component Value Date   CHOL 191 12/10/2019   Lab Results  Component Value Date   HDL 83 12/10/2019   Lab Results  Component Value Date   LDLCALC 100 (H) 12/10/2019   Lab Results  Component Value Date   TRIG 38 12/10/2019   Lab Results  Component Value Date   CHOLHDL 2.3 12/10/2019   Lab Results   Component Value Date   HGBA1C 4.6 06/30/2019      Assessment & Plan:   Problem List Items Addressed This Visit    None    Visit Diagnoses    Healthcare maintenance    -  Primary   Relevant Orders   POCT URINALYSIS DIP (CLINITEK) (Completed)   Need for influenza vaccination       Relevant Orders   Flu Vaccine QUAD 36+ mos IM (Completed)   Mild intermittent asthma, unspecified whether complicated     Encourage use of medication for symptoms    Relevant Medications   albuterol (VENTOLIN HFA) 108 (90 Base) MCG/ACT inhaler      Meds ordered this encounter  Medications   albuterol (VENTOLIN HFA) 108 (90 Base) MCG/ACT inhaler    Sig: Inhale 2 puffs into the lungs every 6 (six) hours as needed for wheezing or shortness of breath.    Dispense:  8 g    Refill:  2    Order Specific Question:   Supervising Provider    Answer:   Tresa Garter [[id number]]    Follow-up: Return in about 6 months (around 11/11/2020).    Vevelyn Francois, NP

## 2020-05-16 MED FILL — ALBUTEROL SULFATE HFA 108 (: 108 (90 BAS | 25 days supply | Qty: 18 | Fill #0

## 2020-11-11 ENCOUNTER — Ambulatory Visit (INDEPENDENT_AMBULATORY_CARE_PROVIDER_SITE_OTHER): Payer: Self-pay | Admitting: Nurse Practitioner

## 2020-11-11 ENCOUNTER — Encounter: Payer: Self-pay | Admitting: Nurse Practitioner

## 2020-11-11 ENCOUNTER — Other Ambulatory Visit: Payer: Self-pay

## 2020-11-11 VITALS — BP 110/63 | HR 70 | Temp 98.3°F | Ht <= 58 in | Wt 163.0 lb

## 2020-11-11 DIAGNOSIS — Z13220 Encounter for screening for lipoid disorders: Secondary | ICD-10-CM

## 2020-11-11 DIAGNOSIS — J4520 Mild intermittent asthma, uncomplicated: Secondary | ICD-10-CM

## 2020-11-11 DIAGNOSIS — E876 Hypokalemia: Secondary | ICD-10-CM

## 2020-11-11 NOTE — Progress Notes (Signed)
[place], [place]  [address] Phone:  [phone]   Fax:  [phone]   Established Patient Office Visit  Subjective:  Patient ID: Sherri Shaw, female    DOB: 1980/09/26  Age: 40 y.o. MRN: [id number]  CC:  Chief Complaint  Patient presents with  . Follow-up    6  month follow up     HPI Dazja Houchin presents for follow up. She  has a past medical history of Anemia, Asthma, Bipolar disorder ([place]), Chronic bronchitis ([place]), Exertional shortness of breath, History of blood transfusion (04/07/2013), Microcytic anemia, and Schizophrenia ([place]).   Asthma Patient presents for evaluation of asthma. The patient has been previously diagnosed with asthma. Symptoms currently include non existant in the last 6 months.  She admits that she is staying home and not outdoors much at all due to COVID and being unemployeed. Observed precipitants include: no identifiable factor. Current limitations in activity from asthma: none. Number of days of school or work missed in the last month: not applicable.  Denies headache, dizziness, visual changes, shortness of breath, dyspnea on exertion, chest pain, nausea, vomiting or any edema.   Past Medical History:  Diagnosis Date  . Anemia   . Asthma   . Bipolar disorder [facility])    sister denies this hx on 04/07/2013  . Chronic bronchitis ([place])    "I get it q yr" (04/07/2013)  . Exertional shortness of breath    ? due to anemia  . History of blood transfusion 04/07/2013   "got my first transfusion today; got 4 units" (04/07/2013)  . Microcytic anemia    Archie Endo 04/07/2013  . Schizophrenia ([place])    "don't really know the type"/sister (04/07/2013)    Past Surgical History:  Procedure Laterality Date  . ABDOMINAL HYSTERECTOMY N/A 06/01/2013   Procedure: HYSTERECTOMY ABDOMINAL;  Surgeon: Lavonia Drafts, MD;  Location: [facility];  Service: Gynecology;  Laterality: N/A;  . BILATERAL SALPINGECTOMY Bilateral 06/01/2013    Procedure: BILATERAL SALPINGECTOMY;  Surgeon: Lavonia Drafts, MD;  Location: [facility];  Service: Gynecology;  Laterality: Bilateral;  . CYSTOSCOPY W/ URETERAL STENT PLACEMENT Bilateral 06/01/2013   Procedure: CYSTOSCOPY WITH BILATERAL STENT REPLACEMENT;  Surgeon: Irine Seal, MD;  Location: [facility];  Service: Urology;  Laterality: Bilateral;  . HYSTERECTOMY ABDOMINAL WITH SALPINGECTOMY    . NO PAST SURGERIES      Family History  Problem Relation Age of Onset  . Hypertension Mother   . Diabetes Mother   . Asthma Mother   . Stroke Mother   . Cancer Mother   . Asthma Brother   . Heart disease Maternal Aunt     Social History   Socioeconomic History  . Marital status: Single    Spouse name: Not on file  . Number of children: Not on file  . Years of education: Coll  . Highest education level: Not on file  Occupational History  . Not on file  Tobacco Use  . Smoking status: Never Smoker  . Smokeless tobacco: Never Used  Vaping Use  . Vaping Use: Never used  Substance and Sexual Activity  . Alcohol use: No  . Drug use: No  . Sexual activity: Not Currently  Other Topics Concern  . 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 albuterol (VENTOLIN HFA) 108 (90 Base) MCG/ACT inhaler Inhale 2 puffs into the lungs every 6 (six) hours as needed for wheezing or shortness of breath. 8 g 2  . ipratropium (ATROVENT) 0.02 % nebulizer solution Take 2.5 mLs (0.5 mg total) by nebulization every 6 (six) hours as needed for wheezing or shortness of breath. 75 mL 11  .  montelukast (SINGULAIR) 10 MG tablet Take 1 tablet (10 mg total) by mouth at bedtime. (Patient not taking: Reported on 05/13/2020) 30 tablet 11   No facility-administered medications prior to visit.    No Known Allergies  ROS Review of Systems    Objective:    Physical Exam Constitutional:      Appearance: She is obese.  HENT:     Head: Normocephalic and atraumatic.     Right Ear: Tympanic membrane normal.     Left Ear: Tympanic membrane normal.     Nose: Nose normal.     Mouth/Throat:     Mouth: Mucous membranes are moist.  Cardiovascular:     Rate and Rhythm: Normal rate and regular rhythm.     Pulses: Normal pulses.     Heart sounds: Normal heart sounds.  Pulmonary:     Effort: Pulmonary effort is normal.     Breath sounds: Normal breath sounds.  Abdominal:     General: Bowel sounds are normal.     Palpations: Abdomen is soft.  Musculoskeletal:        General: Normal range of motion.     Cervical back: Normal range of motion.  Skin:    General: Skin is warm.     Capillary Refill: Capillary refill takes less than 2 seconds.  Neurological:     General: No focal deficit present.     Mental Status: She is alert.  Psychiatric:        Mood and Affect: Mood normal.        Behavior: Behavior normal.        Thought Content: Thought content normal.        Judgment: Judgment normal.     BP 110/63 (BP Location: Left Arm, Patient Position: Sitting, Cuff Size: Large)   Pulse 70   Temp 98.3 F (36.8 C) (Temporal)   Ht 4\' 8"  (1.422 m)   Wt 163 lb (73.9 kg)   LMP 05/25/2013 (LMP Unknown)   SpO2 99%   BMI 36.54 kg/m  Wt Readings from Last 3 Encounters:  11/11/20 163 lb (73.9 kg)  02/11/20 164 lb 0.4 oz (74.4 kg)  12/10/19 163 lb (73.9 kg)     Health Maintenance Due  Topic Date Due  . Hepatitis C Screening  Never done  . COVID-19 Vaccine (3 - Booster for Pfizer series) 06/02/2020  . PAP SMEAR-Modifier  12/02/2020    There are no preventive care reminders to  display for this patient.  Lab Results  Component Value Date   TSH 1.750 12/10/2019   Lab Results  Component Value Date   WBC 6.6 12/10/2019   HGB 12.0 12/10/2019   HCT 36.9 12/10/2019   MCV 88 12/10/2019   PLT 255 12/10/2019   Lab Results  Component Value Date   NA 139 11/11/2020   K 4.0  11/11/2020   CO2 25 11/26/2018   GLUCOSE 80 11/11/2020   BUN 7 11/11/2020   CREATININE 0.80 11/11/2020   BILITOT 0.3 11/11/2020   ALKPHOS 69 11/11/2020   AST 16 11/11/2020   ALT 8 11/26/2018   PROT 7.5 11/11/2020   ALBUMIN 4.5 11/11/2020   CALCIUM 9.0 11/11/2020   ANIONGAP 11 10/14/2018   Lab Results  Component Value Date   CHOL 218 (H) 11/11/2020   Lab Results  Component Value Date   HDL 86 11/11/2020   Lab Results  Component Value Date   LDLCALC 123 (H) 11/11/2020   Lab Results  Component Value Date   TRIG 50 11/11/2020   Lab Results  Component Value Date   CHOLHDL 2.5 11/11/2020   Lab Results  Component Value Date   HGBA1C 4.6 06/30/2019      Assessment & Plan:   Problem List Items Addressed This Visit      Other   Hypokalemia   Relevant Orders   Comp. Metabolic Panel (12) (Completed)    Other Visit Diagnoses    Mild intermittent asthma, unspecified whether complicated    -  Primary Stable on no current treatment  Will continue to monitor  Follow with any changes   Screening for cholesterol level       Relevant Orders   Lipid panel (Completed)  BCCCP  Referral sent via intersystem due to lack of insurance and no previous Mammogram. Will have PAP test completed there also to avoid bill with testing facility.  SBE teaching provided with education material  and encouraged each 25 day of the month.  Pt to follow up if this has not been completed within the next 3 months.     No orders of the defined types were placed in this encounter.   Follow-up: Return in about 6 months (around 05/13/2021) for [place] [[id number]].    Vevelyn Francois,  NP

## 2020-11-11 NOTE — Patient Instructions (Signed)
Health Maintenance, Female Adopting a healthy lifestyle and getting preventive care are important in promoting health and wellness. Ask your health care provider about:  The right schedule for you to have regular tests and exams.  Things you can do on your own to prevent diseases and keep yourself healthy. What should I know about diet, weight, and exercise? Eat a healthy diet  Eat a diet that includes plenty of vegetables, fruits, low-fat dairy products, and lean protein.  Do not eat a lot of foods that are high in solid fats, added sugars, or sodium.   Maintain a healthy weight Body mass index (BMI) is used to identify weight problems. It estimates body fat based on height and weight. Your health care provider can help determine your BMI and help you achieve or maintain a healthy weight. Get regular exercise Get regular exercise. This is one of the most important things you can do for your health. Most adults should:  Exercise for at least 150 minutes each week. The exercise should increase your heart rate and make you sweat (moderate-intensity exercise).  Do strengthening exercises at least twice a week. This is in addition to the moderate-intensity exercise.  Spend less time sitting. Even light physical activity can be beneficial. Watch cholesterol and blood lipids Have your blood tested for lipids and cholesterol at 42 years of age, then have this test every 5 years. Have your cholesterol levels checked more often if:  Your lipid or cholesterol levels are high.  You are older than 42 years of age.  You are at high risk for heart disease. What should I know about cancer screening? Depending on your health history and family history, you may need to have cancer screening at various ages. This may include screening for:  Breast cancer.  Cervical cancer.  Colorectal cancer.  Skin cancer.  Lung cancer. What should I know about heart disease, diabetes, and high blood  pressure? Blood pressure and heart disease  High blood pressure causes heart disease and increases the risk of stroke. This is more likely to develop in people who have high blood pressure readings, are of African descent, or are overweight.  Have your blood pressure checked: ? Every 3-5 years if you are 18-39 years of age. ? Every year if you are 40 years old or older. Diabetes Have regular diabetes screenings. This checks your fasting blood sugar level. Have the screening done:  Once every three years after age 40 if you are at a normal weight and have a low risk for diabetes.  More often and at a younger age if you are overweight or have a high risk for diabetes. What should I know about preventing infection? Hepatitis B If you have a higher risk for hepatitis B, you should be screened for this virus. Talk with your health care provider to find out if you are at risk for hepatitis B infection. Hepatitis C Testing is recommended for:  Everyone born from 1945 through 1965.  Anyone with known risk factors for hepatitis C. Sexually transmitted infections (STIs)  Get screened for STIs, including gonorrhea and chlamydia, if: ? You are sexually active and are younger than 42 years of age. ? You are older than 42 years of age and your health care provider tells you that you are at risk for this type of infection. ? Your sexual activity has changed since you were last screened, and you are at increased risk for chlamydia or gonorrhea. Ask your health care provider   if you are at risk.  Ask your health care provider about whether you are at high risk for HIV. Your health care provider may recommend a prescription medicine to help prevent HIV infection. If you choose to take medicine to prevent HIV, you should first get tested for HIV. You should then be tested every 3 months for as long as you are taking the medicine. Pregnancy  If you are about to stop having your period (premenopausal) and  you may become pregnant, seek counseling before you get pregnant.  Take 400 to 800 micrograms (mcg) of folic acid every day if you become pregnant.  Ask for birth control (contraception) if you want to prevent pregnancy. Osteoporosis and menopause Osteoporosis is a disease in which the bones lose minerals and strength with aging. This can result in bone fractures. If you are 54 years old or older, or if you are at risk for osteoporosis and fractures, ask your health care provider if you should:  Be screened for bone loss.  Take a calcium or vitamin D supplement to lower your risk of fractures.  Be given hormone replacement therapy (HRT) to treat symptoms of menopause. Follow these instructions at home: Lifestyle  Do not use any products that contain nicotine or tobacco, such as cigarettes, e-cigarettes, and chewing tobacco. If you need help quitting, ask your health care provider.  Do not use street drugs.  Do not share needles.  Ask your health care provider for help if you need support or information about quitting drugs. Alcohol use  Do not drink alcohol if: ? Your health care provider tells you not to drink. ? You are pregnant, may be pregnant, or are planning to become pregnant.  If you drink alcohol: ? Limit how much you use to 0-1 drink a day. ? Limit intake if you are breastfeeding.  Be aware of how much alcohol is in your drink. In the [place] one drink equals one 12 oz bottle of beer (355 mL), one 5 oz glass of wine (148 mL), or one 1 oz glass of hard liquor (44 mL). General instructions  Schedule regular health, dental, and eye exams.  Stay current with your vaccines.  Tell your health care provider if: ? You often feel depressed. ? You have ever been abused or do not feel safe at home. Summary  Adopting a healthy lifestyle and getting preventive care are important in promoting health and wellness.  Follow your health care provider's instructions about healthy  diet, exercising, and getting tested or screened for diseases.  Follow your health care provider's instructions on monitoring your cholesterol and blood pressure. This information is not intended to replace advice given to you by your health care provider. Make sure you discuss any questions you have with your health care provider. Document Revised: 07/16/2018 Document Reviewed: 07/16/2018 Elsevier Patient Education  2021 [place] Breast self-awareness is knowing how your breasts look and feel. Doing breast self-awareness is important. It allows you to catch a breast problem early while it is still small and can be treated. All women should do breast self-awareness, including women who have had breast implants. Tell your doctor if you notice a change in your breasts. What you need:  A mirror.  A well-lit room. How to do a breast self-exam A breast self-exam is one way to learn what is normal for your breasts and to check for changes. To do a breast self-exam: Look for changes 1. Take off all the clothes  above your waist. 2. Stand in front of a mirror in a room with good lighting. 3. Put your hands on your hips. 4. Push your hands down. 5. Look at your breasts and nipples in the mirror to see if one breast or nipple looks different from the other. Check to see if: ? The shape of one breast is different. ? The size of one breast is different. ? There are wrinkles, dips, and bumps in one breast and not the other. 6. Look at each breast for changes in the skin, such as: ? Redness. ? Scaly areas. 7. Look for changes in your nipples, such as: ? Liquid around the nipples. ? Bleeding. ? Dimpling. ? Redness. ? A change in where the nipples are.   Feel for changes 1. Lie on your back on the floor. 2. Feel each breast. To do this, follow these steps: ? Pick a breast to feel. ? Put the arm closest to that breast above your head. ? Use your other arm to feel the  nipple area of your breast. Feel the area with the pads of your three middle fingers by making small circles with your fingers. For the first circle, press lightly. For the second circle, press harder. For the third circle, press even harder. ? Keep making circles with your fingers at the different pressures as you move down your breast. Stop when you feel your ribs. ? Move your fingers a little toward the center of your body. ? Start making circles with your fingers again, this time going up until you reach your collarbone. ? Keep making up-and-down circles until you reach your armpit. Remember to keep using the three pressures. ? Feel the other breast in the same way. 3. Sit or stand in the tub or shower. 4. With soapy water on your skin, feel each breast the same way you did in step 2 when you were lying on the floor.   Write down what you find Writing down what you find can help you remember what to tell your doctor. Write down:  What is normal for each breast.  Any changes you find in each breast, including: ? The kind of changes you find. ? Whether you have pain. ? Size and location of any lumps.  When you last had your menstrual period. General tips  Check your breasts every month.  If you are breastfeeding, the best time to check your breasts is after you feed your baby or after you use a breast pump.  If you get menstrual periods, the best time to check your breasts is 5-7 days after your menstrual period is over.  With time, you will become comfortable with the self-exam, and you will begin to know if there are changes in your breasts. Contact a doctor if you:  See a change in the shape or size of your breasts or nipples.  See a change in the skin of your breast or nipples, such as red or scaly skin.  Have fluid coming from your nipples that is not normal.  Find a lump or thick area that was not there before.  Have pain in your breasts.  Have any concerns about your  breast health. Summary  Breast self-awareness includes looking for changes in your breasts, as well as feeling for changes within your breasts.  Breast self-awareness should be done in front of a mirror in a well-lit room.  You should check your breasts every month. If you get menstrual periods, the  best time to check your breasts is 5-7 days after your menstrual period is over.  Let your doctor know of any changes you see in your breasts, including changes in size, changes on the skin, pain or tenderness, or fluid from your nipples that is not normal. This information is not intended to replace advice given to you by your health care provider. Make sure you discuss any questions you have with your health care provider. Document Revised: 03/11/2018 Document Reviewed: 03/11/2018 Elsevier Patient Education  [place].

## 2020-11-12 LAB — LIPID PANEL
Chol/HDL Ratio: 2.5 ratio (ref 0.0–4.4)
Cholesterol, Total: 218 mg/dL — ABNORMAL HIGH (ref 100–199)
HDL: 86 mg/dL (ref >39)
LDL Chol Calc (NIH): 123 mg/dL — ABNORMAL HIGH (ref 0–99)
Triglycerides: 50 mg/dL (ref 0–149)
VLDL Cholesterol Cal: 9 mg/dL (ref 5–40)

## 2020-11-12 LAB — COMP. METABOLIC PANEL (12)
AST: 16 IU/L (ref 0–40)
Albumin/Globulin Ratio: 1.5 (ref 1.2–2.2)
Albumin: 4.5 g/dL (ref 3.8–4.8)
Alkaline Phosphatase: 69 IU/L (ref 44–121)
BUN/Creatinine Ratio: 9 (ref 9–23)
BUN: 7 mg/dL (ref 6–24)
Bilirubin Total: 0.3 mg/dL (ref 0.0–1.2)
Calcium: 9 mg/dL (ref 8.7–10.2)
Chloride: 100 mmol/L (ref 96–106)
Creatinine, Ser: 0.8 mg/dL (ref 0.57–1.00)
Globulin, Total: 3 g/dL (ref 1.5–4.5)
Glucose: 80 mg/dL (ref 65–99)
Potassium: 4 mmol/L (ref 3.5–5.2)
Sodium: 139 mmol/L (ref 134–144)
Total Protein: 7.5 g/dL (ref 6.0–8.5)
eGFR: 94 mL/min/{1.73_m2} (ref >59)

## 2021-02-23 ENCOUNTER — Telehealth: Payer: Self-pay

## 2021-02-23 NOTE — Telephone Encounter (Signed)
Telephoned patient at home number. Voice mail not set up, unable to leave a message.

## 2021-05-17 ENCOUNTER — Ambulatory Visit: Payer: Self-pay | Admitting: Nurse Practitioner

## 2021-06-14 ENCOUNTER — Ambulatory Visit (INDEPENDENT_AMBULATORY_CARE_PROVIDER_SITE_OTHER): Payer: Self-pay | Admitting: Nurse Practitioner

## 2021-06-14 ENCOUNTER — Other Ambulatory Visit: Payer: Self-pay

## 2021-06-14 VITALS — BP 118/67 | HR 66 | Temp 97.7°F | Ht <= 58 in | Wt 189.0 lb

## 2021-06-14 DIAGNOSIS — E66813 Obesity, class 3: Secondary | ICD-10-CM

## 2021-06-14 DIAGNOSIS — Z01419 Encounter for gynecological examination (general) (routine) without abnormal findings: Secondary | ICD-10-CM

## 2021-06-14 DIAGNOSIS — F3170 Bipolar disorder, currently in remission, most recent episode unspecified: Secondary | ICD-10-CM

## 2021-06-14 DIAGNOSIS — J4530 Mild persistent asthma, uncomplicated: Secondary | ICD-10-CM

## 2021-06-14 DIAGNOSIS — Z1239 Encounter for other screening for malignant neoplasm of breast: Secondary | ICD-10-CM

## 2021-06-14 DIAGNOSIS — Z23 Encounter for immunization: Secondary | ICD-10-CM

## 2021-06-14 DIAGNOSIS — B372 Candidiasis of skin and nail: Secondary | ICD-10-CM

## 2021-06-14 MED ORDER — IPRATROPIUM BROMIDE 0.02 % IN SOLN
0.5000 mg | Freq: Four times a day (QID) | RESPIRATORY_TRACT | 11 refills | Status: DC | PRN
  Filled 2021-06-14: qty 62.5, 7d supply, fill #0

## 2021-06-14 MED ORDER — NYSTATIN 100000 UNIT/GM EX POWD
1.0000 "application " | Freq: Three times a day (TID) | CUTANEOUS | 1 refills | Status: DC
  Filled 2021-06-14: qty 30, 10d supply, fill #0

## 2021-06-14 MED ORDER — ALBUTEROL SULFATE HFA 108 (90 BASE) MCG/ACT IN AERS
2.0000 | INHALATION_SPRAY | Freq: Four times a day (QID) | RESPIRATORY_TRACT | 2 refills | Status: DC | PRN
  Filled 2021-06-14: qty 8.5, 25d supply, fill #0

## 2021-06-14 NOTE — Patient Instructions (Addendum)
Breast Self-Awareness Breast self-awareness is knowing how your breasts look and feel. Doing breast self-awareness is important. It allows you to catch a breast problem early while it is still small and can be treated. All women should do breast self-awareness, including women who have had breast implants. Tell your doctor if you notice a change in your breasts. What you need: A mirror. A well-lit room. How to do a breast self-exam A breast self-exam is one way to learn what is normal for your breasts and to check for changes. To do a breast self-exam: Look for changes  Take off all the clothes above your waist. Stand in front of a mirror in a room with good lighting. Put your hands on your hips. Push your hands down. Look at your breasts and nipples in the mirror to see if one breast or nipple looks different from the other. Check to see if: The shape of one breast is different. The size of one breast is different. There are wrinkles, dips, and bumps in one breast and not the other. Look at each breast for changes in the skin, such as: Redness. Scaly areas. Look for changes in your nipples, such as: Liquid around the nipples. Bleeding. Dimpling. Redness. A change in where the nipples are. Feel for changes  Lie on your back on the floor. Feel each breast. To do this, follow these steps: Pick a breast to feel. Put the arm closest to that breast above your head. Use your other arm to feel the nipple area of your breast. Feel the area with the pads of your three middle fingers by making small circles with your fingers. For the first circle, press lightly. For the second circle, press harder. For the third circle, press even harder. Keep making circles with your fingers at the different pressures as you move down your breast. Stop when you feel your ribs. Move your fingers a little toward the center of your body. Start making circles with your fingers again, this time going up until  you reach your collarbone. Keep making up-and-down circles until you reach your armpit. Remember to keep using the three pressures. Feel the other breast in the same way. Sit or stand in the tub or shower. With soapy water on your skin, feel each breast the same way you did in step 2 when you were lying on the floor. Write down what you find Writing down what you find can help you remember what to tell your doctor. Write down: What is normal for each breast. Any changes you find in each breast, including: The kind of changes you find. Whether you have pain. Size and location of any lumps. When you last had your menstrual period. General tips Check your breasts every month. If you are breastfeeding, the best time to check your breasts is after you feed your baby or after you use a breast pump. If you get menstrual periods, the best time to check your breasts is 5-7 days after your menstrual period is over. With time, you will become comfortable with the self-exam, and you will begin to know if there are changes in your breasts. Contact a doctor if you: See a change in the shape or size of your breasts or nipples. See a change in the skin of your breast or nipples, such as red or scaly skin. Have fluid coming from your nipples that is not normal. Find a lump or thick area that was not there before. Have pain in   your breasts. Have any concerns about your breast health. Summary Breast self-awareness includes looking for changes in your breasts, as well as feeling for changes within your breasts. Breast self-awareness should be done in front of a mirror in a well-lit room. You should check your breasts every month. If you get menstrual periods, the best time to check your breasts is 5-7 days after your menstrual period is over. Let your doctor know of any changes you see in your breasts, including changes in size, changes on the skin, pain or tenderness, or fluid from your nipples that is not  normal. This information is not intended to replace advice given to you by your health care provider. Make sure you discuss any questions you have with your health care provider. Document Revised: 03/11/2018 Document Reviewed: 03/11/2018 Elsevier Patient Education  2022 Elsevier Inc.  Preventive Care 54-75 Years Old, Female Preventive care refers to lifestyle choices and visits with your health care provider that can promote health and wellness. Preventive care visits are also called wellness exams. What can I expect for my preventive care visit? Counseling Your health care provider may ask you questions about your: Medical history, including: Past medical problems. Family medical history. Pregnancy history. Current health, including: Menstrual cycle. Method of birth control. Emotional well-being. Home life and relationship well-being. Sexual activity and sexual health. Lifestyle, including: Alcohol, nicotine or tobacco, and drug use. Access to firearms. Diet, exercise, and sleep habits. Work and work Statistician. Sunscreen use. Safety issues such as seatbelt and bike helmet use. Physical exam Your health care provider will check your: Height and weight. These may be used to calculate your BMI (body mass index). BMI is a measurement that tells if you are at a healthy weight. Waist circumference. This measures the distance around your waistline. This measurement also tells if you are at a healthy weight and may help predict your risk of certain diseases, such as type 2 diabetes and high blood pressure. Heart rate and blood pressure. Body temperature. Skin for abnormal spots. What immunizations do I need? Vaccines are usually given at various ages, according to a schedule. Your health care provider will recommend vaccines for you based on your age, medical history, and lifestyle or other factors, such as travel or where you work. What tests do I need? Screening Your health care  provider may recommend screening tests for certain conditions. This may include: Lipid and cholesterol levels. Diabetes screening. This is done by checking your blood sugar (glucose) after you have not eaten for a while (fasting). Pelvic exam and Pap test. Hepatitis B test. Hepatitis C test. HIV (human immunodeficiency virus) test. STI (sexually transmitted infection) testing, if you are at risk. Lung cancer screening. Colorectal cancer screening. Mammogram. Talk with your health care provider about when you should start having regular mammograms. This may depend on whether you have a family history of breast cancer. BRCA-related cancer screening. This may be done if you have a family history of breast, ovarian, tubal, or peritoneal cancers. Bone density scan. This is done to screen for osteoporosis. Talk with your health care provider about your test results, treatment options, and if necessary, the need for more tests. Follow these instructions at home: Eating and drinking  Eat a diet that includes fresh fruits and vegetables, whole grains, lean protein, and low-fat dairy products. Take vitamin and mineral supplements as recommended by your health care provider. Do not drink alcohol if: Your health care provider tells you not to drink. You are  pregnant, may be pregnant, or are planning to become pregnant. If you drink alcohol: Limit how much you have to 0-1 drink a day. Know how much alcohol is in your drink. In the [place] one drink equals one 12 oz bottle of beer (355 mL), one 5 oz glass of wine (148 mL), or one 1 oz glass of hard liquor (44 mL). Lifestyle Brush your teeth every morning and night with fluoride toothpaste. Floss one time each day. Exercise for at least 30 minutes 5 or more days each week. Do not use any products that contain nicotine or tobacco. These products include cigarettes, chewing tobacco, and vaping devices, such as e-cigarettes. If you need help quitting, ask  your health care provider. Do not use drugs. If you are sexually active, practice safe sex. Use a condom or other form of protection to prevent STIs. If you do not wish to become pregnant, use a form of birth control. If you plan to become pregnant, see your health care provider for a prepregnancy visit. Take aspirin only as told by your health care provider. Make sure that you understand how much to take and what form to take. Work with your health care provider to find out whether it is safe and beneficial for you to take aspirin daily. Find healthy ways to manage stress, such as: Meditation, yoga, or listening to music. Journaling. Talking to a trusted person. Spending time with friends and family. Minimize exposure to UV radiation to reduce your risk of skin cancer. Safety Always wear your seat belt while driving or riding in a vehicle. Do not drive: If you have been drinking alcohol. Do not ride with someone who has been drinking. When you are tired or distracted. While texting. If you have been using any mind-altering substances or drugs. Wear a helmet and other protective equipment during sports activities. If you have firearms in your house, make sure you follow all gun safety procedures. Seek help if you have been physically or sexually abused. What's next? Visit your health care provider once a year for an annual wellness visit. Ask your health care provider how often you should have your eyes and teeth checked. Stay up to date on all vaccines. This information is not intended to replace advice given to you by your health care provider. Make sure you discuss any questions you have with your health care provider. Document Revised: 01/18/2021 Document Reviewed: 01/18/2021 Elsevier Patient Education  2022 [place].  Obesity, Adult Obesity is having too much body fat. Being obese means that your weight is more than what is healthy for you.  BMI (body mass index) is a number  that explains how much body fat you have. If you have a BMI of 30 or more, you are obese. Obesity can cause serious health problems, such as: Stroke. Coronary artery disease (CAD). Type 2 diabetes. Some types of cancer. High blood pressure (hypertension). High cholesterol. Gallbladder stones. Obesity can also contribute to: Osteoarthritis. Sleep apnea. Infertility problems. What are the causes? Eating meals each day that are high in calories, sugar, and fat. Drinking a lot of drinks that have sugar in them. Being born with genes that may make you more likely to become obese. Having a medical condition that causes obesity. Taking certain medicines. Sitting a lot (having a sedentary lifestyle). Not getting enough sleep. What increases the risk? Having a family history of obesity. Living in an area with limited access to: [place], recreation centers, or sidewalks. Healthy food choices,  such as grocery stores and farmers' markets. What are the signs or symptoms? The main sign is having too much body fat. How is this treated? Treatment for this condition often includes changing your lifestyle. Treatment may include: Changing your diet. This may include making a healthy meal plan. Exercise. This may include activity that causes your heart to beat faster (aerobic exercise) and strength training. Work with your doctor to design a program that works for you. Medicine to help you lose weight. This may be used if you are not able to lose one pound a week after 6 weeks of healthy eating and more exercise. Treating conditions that cause the obesity. Surgery. Options may include gastric banding and gastric bypass. This may be done if: Other treatments have not helped to improve your condition. You have a BMI of 40 or higher. You have life-threatening health problems related to obesity. Follow these instructions at home: Eating and drinking  Follow advice from your doctor about what to eat  and drink. Your doctor may tell you to: Limit fast food, sweets, and processed snack foods. Choose low-fat options. For example, choose low-fat milk instead of whole milk. Eat five or more servings of fruits or vegetables each day. Eat at home more often. This gives you more control over what you eat. Choose healthy foods when you eat out. Learn to read food labels. This will help you learn how much food is in one serving. Keep low-fat snacks available. Avoid drinks that have a lot of sugar in them. These include soda, fruit juice, iced tea with sugar, and flavored milk. Drink enough water to keep your pee (urine) pale yellow. Do not go on fad diets. Physical activity Exercise often, as told by your doctor. Most adults should get up to 150 minutes of moderate-intensity exercise every week.Ask your doctor: What types of exercise are safe for you. How often you should exercise. Warm up and stretch before being active. Do slow stretching after being active (cool down). Rest between times of being active. Lifestyle Work with your doctor and a food expert (dietitian) to set a weight-loss goal that is best for you. Limit your screen time. Find ways to reward yourself that do not involve food. Do not drink alcohol if: Your doctor tells you not to drink. You are pregnant, may be pregnant, or are planning to become pregnant. If you drink alcohol: Limit how much you have to: 0-1 drink a day for women. 0-2 drinks a day for men. Know how much alcohol is in your drink. In the [place] one drink equals one 12 oz bottle of beer (355 mL), one 5 oz glass of wine (148 mL), or one 1 oz glass of hard liquor (44 mL). General instructions Keep a weight-loss journal. This can help you keep track of: The food that you eat. How much exercise you get. Take over-the-counter and prescription medicines only as told by your doctor. Take vitamins and supplements only as told by your doctor. Think about joining a  support group. Pay attention to your mental health as obesity can lead to depression or self esteem issues. Keep all follow-up visits. Contact a doctor if: You cannot meet your weight-loss goal after you have changed your diet and lifestyle for 6 weeks. You are having trouble breathing. Summary Obesity is having too much body fat. Being obese means that your weight is more than what is healthy for you. Work with your doctor to set a weight-loss goal. Get regular exercise  as told by your doctor. This information is not intended to replace advice given to you by your health care provider. Make sure you discuss any questions you have with your health care provider. Document Revised: 02/28/2021 Document Reviewed: 02/28/2021 Elsevier Patient Education  [place].

## 2021-06-14 NOTE — Progress Notes (Signed)
[address], [place]  [address] Phone:  [phone]   Fax:  [phone]   Established Patient Office Visit  Subjective:  Patient ID: Sherri Shaw, female    DOB: Feb 21, 1981  Age: 40 y.o. MRN: [id number]  CC:  Chief Complaint  Patient presents with   Follow-up    6 month follow up;     HPI Sherri Shaw presents for physical exam. She  has a past medical history of Anemia, Asthma, Bipolar disorder ([place]), Chronic bronchitis ([place]), Exertional shortness of breath, History of blood transfusion (04/07/2013), Microcytic anemia, and Schizophrenia ([place]).   She denies any concerns today. She reports walking as apart of her regular exercise however weight gain 23 pounds.    Past Medical History:  Diagnosis Date   Anemia    Asthma    Bipolar disorder ([place])    sister denies this hx on 04/07/2013   Chronic bronchitis ([place])    "I get it q yr" (04/07/2013)   Exertional shortness of breath    ? due to anemia   History of blood transfusion 04/07/2013   "got my first transfusion today; got 4 units" (04/07/2013)   Microcytic anemia    Archie Endo 04/07/2013   Schizophrenia ([place])    "don't really know the type"/sister (04/07/2013)    Past Surgical History:  Procedure Laterality Date   ABDOMINAL HYSTERECTOMY N/A 06/01/2013   Procedure: HYSTERECTOMY ABDOMINAL;  Surgeon: Lavonia Drafts, MD;  Location: [facility];  Service: Gynecology;  Laterality: N/A;   BILATERAL SALPINGECTOMY Bilateral 06/01/2013   Procedure: BILATERAL SALPINGECTOMY;  Surgeon: Lavonia Drafts, MD;  Location: [facility];  Service: Gynecology;  Laterality: Bilateral;   CYSTOSCOPY W/ URETERAL STENT PLACEMENT Bilateral 06/01/2013   Procedure: CYSTOSCOPY WITH BILATERAL STENT REPLACEMENT;  Surgeon: Irine Seal, MD;  Location: [facility];  Service: Urology;  Laterality: Bilateral;   HYSTERECTOMY ABDOMINAL WITH SALPINGECTOMY     NO PAST SURGERIES      Family History  Problem Relation Age of Onset    Hypertension Mother    Diabetes Mother    Asthma Mother    Stroke Mother    Cancer Mother    Asthma Brother    Heart disease Maternal Aunt     Social History   Socioeconomic History   Marital status: Single    Spouse name: Not on file   Number of children: Not on file   Years of education: Coll   Highest education level: Not on file  Occupational History   Not on file  Tobacco Use   Smoking status: Never   Smokeless tobacco: Never  Vaping Use   Vaping Use: Never used  Substance and Sexual Activity   Alcohol use: No   Drug use: No   Sexual activity: Not Currently  Other Topics Concern   Not on file  Social History Narrative   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  8 g 2   ipratropium (ATROVENT) 0.02 % nebulizer solution Take 2.5 mLs (0.5 mg total) by nebulization every 6 (six) hours as needed for wheezing or shortness of breath. 75 mL 11   No facility-administered medications prior to visit.    No Known Allergies  ROS Review of Systems    Objective:    Physical Exam Exam conducted with a chaperone present.  Constitutional:      Appearance: She is obese.  HENT:     Head: Normocephalic and atraumatic.  Cardiovascular:     Rate and Rhythm: Normal rate.     Pulses: Normal pulses.  Pulmonary:     Effort: Pulmonary effort is normal.  Chest:     Chest wall: No mass or tenderness.  Breasts:    Right: Normal.     Left:  Normal.  Genitourinary:    Vagina: Normal.     Cervix: Discharge present.  Musculoskeletal:     Cervical back: Normal range of motion.  Lymphadenopathy:     Upper Body:     Right upper body: No supraclavicular, axillary or pectoral adenopathy.     Left upper body: No supraclavicular, axillary or pectoral adenopathy.  Neurological:     Mental Status: She is alert.    BP 118/67 (BP Location: Right Arm, Patient Position: Sitting)   Pulse 66   Temp 97.7 F (36.5 C)   Ht _0  (1.422 m)   Wt 189 lb 0.6 oz (85.7 kg)   LMP 05/25/2013 (LMP Unknown)   SpO2 99%   BMI 42.38 kg/m  Wt Readings from Last 3 Encounters:  06/14/21 189 lb 0.6 oz (85.7 kg)  11/11/20 163 lb (73.9 kg)  02/11/20 164 lb 0.4 oz (74.4 kg)     Health Maintenance Due  Topic Date Due   Hepatitis C Screening  Never done   INFLUENZA VACCINE  03/06/2021    There are no preventive care reminders to display for this patient.  Lab Results  Component Value Date   TSH 1.750 12/10/2019   Lab Results  Component Value Date   WBC 6.6 12/10/2019   HGB 12.0 12/10/2019   HCT 36.9 12/10/2019   MCV 88 12/10/2019   PLT 255 12/10/2019   Lab Results  Component Value Date   NA 139 11/11/2020   K 4.0 11/11/2020   CO2 25 11/26/2018   GLUCOSE 80 11/11/2020   BUN 7 11/11/2020   CREATININE 0.80 11/11/2020   BILITOT 0.3 11/11/2020   ALKPHOS 69 11/11/2020   AST 16 11/11/2020   ALT 8 11/26/2018   PROT 7.5 11/11/2020   ALBUMIN 4.5 11/11/2020   CALCIUM 9.0 11/11/2020   ANIONGAP 11 10/14/2018   EGFR 94 11/11/2020   Lab Results  Component Value Date   CHOL 218 (H) 11/11/2020   Lab Results  Component Value Date   HDL 86 11/11/2020   Lab Results  Component Value Date   LDLCALC 123 (H) 11/11/2020   Lab Results  Component Value Date   TRIG 50 11/11/2020   Lab Results  Component Value Date   CHOLHDL 2.5 11/11/2020   Lab Results  Component Value Date   HGBA1C 4.6 06/30/2019      Assessment & Plan:    Problem List Items Addressed This Visit       Other   Bipolar disorder ([place]) Stable no current treatment    Other Visit Diagnoses     Encounter for gynecological examination without abnormal finding    -  Primary Completed pending   Relevant Orders   IGP, CtNg, rfx Aptima HPV ASCU   Mild persistent asthma without complication     Stable    Relevant Medications   albuterol (VENTOLIN HFA) 108 (90 Base) MCG/ACT inhaler   ipratropium (ATROVENT) 0.02 % nebulizer solution   Breast screening    Completed     Relevant Orders   MM DIGITAL SCREENING BILATERAL   Obesity, Class III, BMI 40-49.9 (morbid obesity) ([place])     Discussed proper diet (low fat, low sodium, high fiber) with patient.   Discussed need for regular exercise (3 times per week, 20 minutes per session) with patient.    Flu vaccine need       Yeast infection of the skin Nystatin powder daily to breast area    Meds ordered this encounter  Medications   albuterol (VENTOLIN HFA) 108 (90 Base) MCG/ACT inhaler    Sig: Inhale 2 puffs into the lungs every 6 (six) hours as needed for wheezing or shortness of breath.    Dispense:  8 g    Refill:  2   ipratropium (ATROVENT) 0.02 % nebulizer solution    Sig: Take 2.5 mLs (0.5 mg total) by nebulization every 6 (six) hours as needed for wheezing or shortness of breath.    Dispense:  75 mL    Refill:  11    Follow-up: Return in about 6 months (around 12/12/2021) for asthma.    Vevelyn Francois, NP

## 2021-06-17 LAB — IGP, CTNG, RFX APTIMA HPV ASCU
Chlamydia, Nuc. Acid Amp: NEGATIVE
Gonococcus by Nucleic Acid Amp: NEGATIVE

## 2021-06-19 ENCOUNTER — Other Ambulatory Visit: Payer: Self-pay

## 2021-06-19 ENCOUNTER — Other Ambulatory Visit: Payer: Self-pay | Admitting: Nurse Practitioner

## 2021-06-19 MED ORDER — FLUCONAZOLE 150 MG PO TABS
150.0000 mg | ORAL_TABLET | Freq: Every day | ORAL | 0 refills | Status: AC
  Filled 2021-06-19 – 2021-07-03 (×2): qty 1, 1d supply, fill #0

## 2021-06-21 ENCOUNTER — Other Ambulatory Visit: Payer: Self-pay

## 2021-06-26 ENCOUNTER — Other Ambulatory Visit: Payer: Self-pay

## 2021-07-03 ENCOUNTER — Other Ambulatory Visit: Payer: Self-pay

## 2021-07-04 ENCOUNTER — Other Ambulatory Visit: Payer: Self-pay

## 2021-07-07 ENCOUNTER — Other Ambulatory Visit: Payer: Self-pay

## 2021-07-10 ENCOUNTER — Other Ambulatory Visit: Payer: Self-pay

## 2021-07-12 ENCOUNTER — Other Ambulatory Visit: Payer: Self-pay

## 2021-08-10 ENCOUNTER — Telehealth: Payer: Self-pay

## 2021-08-10 NOTE — Telephone Encounter (Signed)
Attempted to contact patient regarding [place]. Left message on voicemail requesting a return call.

## 2021-12-13 ENCOUNTER — Encounter: Payer: Self-pay | Admitting: Nurse Practitioner

## 2021-12-13 ENCOUNTER — Other Ambulatory Visit: Payer: Self-pay | Admitting: Nurse Practitioner

## 2021-12-13 ENCOUNTER — Telehealth: Payer: Self-pay

## 2021-12-13 ENCOUNTER — Ambulatory Visit (INDEPENDENT_AMBULATORY_CARE_PROVIDER_SITE_OTHER): Payer: Self-pay | Admitting: Nurse Practitioner

## 2021-12-13 ENCOUNTER — Ambulatory Visit: Payer: Self-pay | Admitting: Nurse Practitioner

## 2021-12-13 VITALS — BP 114/68 | HR 84 | Temp 98.2°F | Ht <= 58 in | Wt 189.4 lb

## 2021-12-13 DIAGNOSIS — Z1231 Encounter for screening mammogram for malignant neoplasm of breast: Secondary | ICD-10-CM

## 2021-12-13 DIAGNOSIS — Z0000 Encounter for general adult medical examination without abnormal findings: Secondary | ICD-10-CM

## 2021-12-13 NOTE — Patient Instructions (Signed)
1. Routine adult health maintenance ? ?- CBC ?- Comprehensive metabolic panel ?- TSH ?- Lipid Panel ? ?Follow up: ? ?Follow up in 6 months or sooner if needed ? ?

## 2021-12-13 NOTE — Telephone Encounter (Signed)
Returned patient's call. Left voice message with [facility]/scholarship contact information. ?

## 2021-12-13 NOTE — Progress Notes (Signed)
$'@Patient'e$  ID: Sherri Shaw, female    DOB: April 03, 1981, 41 y.o.   MRN: [id number] ? ?Chief Complaint  ?Patient presents with  ? Follow-up  ?  Patient is here today for her 6 month follow up with no concerns or issues to discuss.  ? ? ?Referring provider: ?Vevelyn Francois, NP ? ? ?HPI ? ?Patient presents today for routine 66-monthfollow-up.  Overall patient has been doing well since her last visit here.  She is due for routine blood work.  She states that she has no new issues or concerns today.  We will check blood work today.  Patient is not currently on any prescription medications. Denies f/c/s, n/v/d, hemoptysis, PND, chest pain or edema ? ? ? ? ?No Known Allergies ? ?Immunization History  ?Administered Date(s) Administered  ? Influenza,inj,Quad PF,6+ Mos 09/04/2014, 08/02/2017, 05/09/2018, 05/13/2020  ? PFIZER(Purple Top)SARS-COV-2 Vaccination 11/07/2019, 12/02/2019  ? Pneumococcal Polysaccharide-23 04/08/2013, 09/04/2014  ? Tdap 08/15/2017  ? ? ?Past Medical History:  ?Diagnosis Date  ? Anemia   ? Asthma   ? [place]   ? sister denies this hx on 04/07/2013  ? [place]   ? "I get it q yr" (04/07/2013)  ? Exertional shortness of breath   ? ? due to anemia  ? History of blood transfusion 04/07/2013  ? "got my first transfusion today; got 4 units" (04/07/2013)  ? Microcytic anemia   ? /Archie Endo9/09/2012  ? Schizophrenia ([place]   ? "don't really know the type"/sister (04/07/2013)  ? ? ?Tobacco History: ?Social History  ? ?Tobacco Use  ?Smoking Status Never  ?Smokeless Tobacco Never  ? ?Counseling given: Not Answered ? ? ?Outpatient Encounter Medications as of 12/13/2021  ?Medication Sig  ? albuterol (VENTOLIN HFA) 108 (90 Base) MCG/ACT inhaler Inhale 2 puffs into the lungs every 6 (six) hours as needed for wheezing or shortness of breath.  ? ipratropium (ATROVENT) 0.02 % nebulizer solution Take 2.5 mLs (0.5 mg total) by nebulization every 6 (six) hours as needed for wheezing or shortness of breath.  ?  [DISCONTINUED] nystatin (MYCOSTATIN/NYSTOP) powder Apply 1 application topically 3 (three) times daily. (Patient not taking: Reported on 12/13/2021)  ? ?No facility-administered encounter medications on file as of 12/13/2021.  ? ? ? ?Review of Systems ? ?Review of Systems  ?Constitutional: Negative.   ?HENT: Negative.    ?Cardiovascular: Negative.   ?Gastrointestinal: Negative.   ?Allergic/Immunologic: Negative.   ?Neurological: Negative.   ?Psychiatric/Behavioral: Negative.     ? ? ? ?Physical Exam ? ?BP 114/68   Pulse 84   Temp 98.2 ?F (36.8 ?C)   Ht '4\' 10"'$  (1.473 m)   Wt 189 lb 6.4 oz (85.9 kg)   LMP 05/25/2013 (LMP Unknown)   SpO2 98%   BMI 39.58 kg/m?  ? ?Wt Readings from Last 5 Encounters:  ?12/13/21 189 lb 6.4 oz (85.9 kg)  ?06/14/21 189 lb 0.6 oz (85.7 kg)  ?11/11/20 163 lb (73.9 kg)  ?02/11/20 164 lb 0.4 oz (74.4 kg)  ?12/10/19 163 lb (73.9 kg)  ? ? ? ?Physical Exam ?Vitals and nursing note reviewed.  ?Constitutional:   ?   General: She is not in acute distress. ?   Appearance: She is well-developed.  ?Cardiovascular:  ?   Rate and Rhythm: Normal rate and regular rhythm.  ?Pulmonary:  ?   Effort: Pulmonary effort is normal.  ?   Breath sounds: Normal breath sounds.  ?Neurological:  ?   Mental Status: She is alert and  oriented to person, place, and time.  ? ? ? ?Lab Results: ? ?CBC ?   ?Component Value Date/Time  ? WBC 6.6 12/10/2019 1102  ? WBC 8.0 10/13/2018 2136  ? RBC 4.20 12/10/2019 1102  ? RBC 5.06 10/13/2018 2136  ? HGB 12.0 12/10/2019 1102  ? HCT 36.9 12/10/2019 1102  ? PLT 255 12/10/2019 1102  ? MCV 88 12/10/2019 1102  ? MCH 28.6 12/10/2019 1102  ? MCH 27.5 10/13/2018 2136  ? MCHC 32.5 12/10/2019 1102  ? MCHC 30.6 10/13/2018 2136  ? RDW 11.8 12/10/2019 1102  ? LYMPHSABS 1.7 12/10/2019 1102  ? MONOABS 0.5 10/13/2018 2136  ? EOSABS 0.3 12/10/2019 1102  ? BASOSABS 0.0 12/10/2019 1102  ? ? ?BMET ?   ?Component Value Date/Time  ? NA 139 11/11/2020 1056  ? K 4.0 11/11/2020 1056  ? CL 100 11/11/2020  1056  ? CO2 25 11/26/2018 1416  ? GLUCOSE 80 11/11/2020 1056  ? GLUCOSE 215 (H) 10/14/2018 0306  ? BUN 7 11/11/2020 1056  ? CREATININE 0.80 11/11/2020 1056  ? CALCIUM 9.0 11/11/2020 1056  ? GFRNONAA 88 12/10/2019 1102  ? GFRAA 101 12/10/2019 1102  ? ? ?BNP ?No results found for: BNP ? ?ProBNP ?   ?Component Value Date/Time  ? PROBNP 131.1 (H) 04/06/2013 2322  ? ? ?Imaging: ?No results found. ? ? ?Assessment & Plan:  ? ?No problem-specific Assessment & Plan notes found for this encounter. ? ? ? ? ?Fenton Foy, NP ?12/13/2021 ? ?

## 2021-12-14 ENCOUNTER — Other Ambulatory Visit: Payer: Self-pay

## 2021-12-14 LAB — CBC
Hematocrit: 35.2 % (ref 34.0–46.6)
Hemoglobin: 11.4 g/dL (ref 11.1–15.9)
MCH: 27.3 pg (ref 26.6–33.0)
MCHC: 32.4 g/dL (ref 31.5–35.7)
MCV: 84 fL (ref 79–97)
Platelets: 300 10*3/uL (ref 150–450)
RBC: 4.18 x10E6/uL (ref 3.77–5.28)
RDW: 12 % (ref 11.7–15.4)
WBC: 6.4 10*3/uL (ref 3.4–10.8)

## 2021-12-14 LAB — COMPREHENSIVE METABOLIC PANEL
ALT: 29 IU/L (ref 0–32)
AST: 31 IU/L (ref 0–40)
Albumin/Globulin Ratio: 1.3 (ref 1.2–2.2)
Albumin: 4 g/dL (ref 3.8–4.8)
Alkaline Phosphatase: 68 IU/L (ref 44–121)
BUN/Creatinine Ratio: 9 (ref 9–23)
BUN: 7 mg/dL (ref 6–24)
Bilirubin Total: 0.3 mg/dL (ref 0.0–1.2)
CO2: 24 mmol/L (ref 20–29)
Calcium: 8.6 mg/dL — ABNORMAL LOW (ref 8.7–10.2)
Chloride: 105 mmol/L (ref 96–106)
Creatinine, Ser: 0.74 mg/dL (ref 0.57–1.00)
Globulin, Total: 3.1 g/dL (ref 1.5–4.5)
Glucose: 91 mg/dL (ref 70–99)
Potassium: 3.6 mmol/L (ref 3.5–5.2)
Sodium: 141 mmol/L (ref 134–144)
Total Protein: 7.1 g/dL (ref 6.0–8.5)
eGFR: 103 mL/min/{1.73_m2} (ref >59)

## 2021-12-14 LAB — TSH: TSH: 3.59 u[IU]/mL (ref 0.450–4.500)

## 2021-12-14 LAB — LIPID PANEL
Chol/HDL Ratio: 2.5 ratio (ref 0.0–4.4)
Cholesterol, Total: 171 mg/dL (ref 100–199)
HDL: 69 mg/dL (ref >39)
LDL Chol Calc (NIH): 91 mg/dL (ref 0–99)
Triglycerides: 55 mg/dL (ref 0–149)
VLDL Cholesterol Cal: 11 mg/dL (ref 5–40)

## 2022-01-25 ENCOUNTER — Other Ambulatory Visit: Payer: Self-pay | Admitting: Nurse Practitioner

## 2022-01-25 ENCOUNTER — Ambulatory Visit
Admission: RE | Admit: 2022-01-25 | Discharge: 2022-01-25 | Disposition: A | Discharge status: Home / Self Care | Payer: Self-pay | Source: Ambulatory Visit | Attending: Nurse Practitioner | Admitting: Nurse Practitioner

## 2022-01-25 DIAGNOSIS — Z1231 Encounter for screening mammogram for malignant neoplasm of breast: Secondary | ICD-10-CM

## 2022-01-29 ENCOUNTER — Other Ambulatory Visit: Payer: Self-pay | Admitting: Nurse Practitioner

## 2022-01-29 DIAGNOSIS — R928 Other abnormal and inconclusive findings on diagnostic imaging of breast: Secondary | ICD-10-CM

## 2022-01-31 ENCOUNTER — Other Ambulatory Visit: Payer: Self-pay

## 2022-01-31 ENCOUNTER — Telehealth: Payer: Self-pay

## 2022-01-31 DIAGNOSIS — N6489 Other specified disorders of breast: Secondary | ICD-10-CM

## 2022-01-31 NOTE — Telephone Encounter (Signed)
Telephoned patient at mobile phone. Left a voice message with BCCCP contact information.

## 2022-02-15 ENCOUNTER — Ambulatory Visit: Payer: Self-pay | Admitting: *Deleted

## 2022-02-15 VITALS — BP 124/80 | Wt 165.9 lb

## 2022-02-15 DIAGNOSIS — Z1239 Encounter for other screening for malignant neoplasm of breast: Secondary | ICD-10-CM

## 2022-02-15 NOTE — Patient Instructions (Addendum)
Explained breast self awareness with Sherri Shaw. Patient did not need a Pap smear today due to patient has a history of a hysterectomy for benign reasons. Let patient know that she doesn't need any further Pap smears due to her history of a hysterectomy for benign reasons. Referred patient to the [facility] for a right breast diagnostic mammogram per recommendation. Appointment scheduled Thursday, February 22, 2022 at 1010. Patient aware of appointment and will be there. Sherri Shaw verbalized understanding.  Bhakti Labella, Arvil Chaco, RN 10:36 AM

## 2022-02-15 NOTE — Progress Notes (Signed)
[name] is a 41 y.o. female who presents to [facility] clinic today with no complaints. Patient referred to [facility] due to having a screening mammogram completed 01/25/2022 that additional imaging of the right breast is recommended for follow up.   Pap Smear: Pap smear not completed today. Last Pap smear was 06/14/2021 at the [facility] clinic and was normal. Per patient has no history of an abnormal Pap smear. Patient has a history of a hysterectomy 06/01/2013 due to fibroids. Patient doesn't need any further Pap smears due to her history of a hysterectomy for benign reasons per BCCCP and ASCCCP guidelines. Last Pap smear result is not available in Epic.   Physical exam: Breasts Right breast slightly larger than left breast that per patient is normal for her. No skin abnormalities bilateral breasts. No nipple retraction bilateral breasts. No nipple discharge bilateral breasts. No lymphadenopathy. No lumps palpated bilateral breasts. No complaints of pain or tenderness on exam.     MM 3D SCREEN BREAST BILATERAL  Result Date: 01/26/2022 CLINICAL DATA:  Screening. Baseline examination. EXAM: DIGITAL SCREENING BILATERAL MAMMOGRAM WITH TOMOSYNTHESIS AND CAD TECHNIQUE: Bilateral screening digital craniocaudal and mediolateral oblique mammograms were obtained. Bilateral screening digital breast tomosynthesis was performed. The images were evaluated with computer-aided detection. COMPARISON:  None ACR Breast Density Category b: There are scattered areas of fibroglandular density. FINDINGS: In the right breast, a possible focal asymmetry within the posterior OUTER breast warrants further evaluation. In the left breast, no findings suspicious for malignancy. IMPRESSION: Further evaluation is suggested for possible focal asymmetry in the right breast. RECOMMENDATION: Diagnostic mammogram and possibly ultrasound of the right breast. (Code:[id number]) The patient will be contacted regarding the  findings, and additional imaging will be scheduled. BI-RADS CATEGORY  0: Incomplete. Need additional imaging evaluation and/or prior mammograms for comparison. Electronically Signed   By: Sherri Shaw M.D.   On: 01/26/2022 12:40     Pelvic/Bimanual Pap is not indicated today per BCCCP guidelines.   Smoking History: Patient has never smoked.   Patient Navigation: Patient education provided. Access to services provided for patient through BCCCP program.    Breast and Cervical Cancer Risk Assessment: Patient has family history of maternal great grandmother having breast cancer. Patient has no known genetic mutations or history of radiation treatment to the chest before age 61. Patient does not have history of cervical dysplasia, immunocompromised, or DES exposure in-utero.  Risk Assessment     Risk Scores       02/15/2022   Last edited by: Sherri Revel, LPN   5-year risk: 0.8 %   Lifetime risk: 9.4 %            A: BCCCP exam without pap smear No complaints.  P: Referred patient to the [facility] for a right breast diagnostic mammogram per recommendation. Appointment scheduled Thursday, February 22, 2022 at 1010.  Sherri Parish, RN 02/15/2022 10:36 AM

## 2022-02-22 ENCOUNTER — Ambulatory Visit: Payer: Self-pay

## 2022-02-22 ENCOUNTER — Ambulatory Visit
Admission: RE | Admit: 2022-02-22 | Discharge: 2022-02-22 | Disposition: A | Discharge status: Home / Self Care | Payer: No Typology Code available for payment source | Source: Ambulatory Visit | Attending: Obstetrics and Gynecology | Admitting: Obstetrics and Gynecology

## 2022-02-22 DIAGNOSIS — N6489 Other specified disorders of breast: Secondary | ICD-10-CM

## 2022-06-15 ENCOUNTER — Other Ambulatory Visit: Payer: Self-pay

## 2022-06-15 ENCOUNTER — Telehealth: Payer: Self-pay

## 2022-06-15 ENCOUNTER — Encounter: Payer: Self-pay | Admitting: Nurse Practitioner

## 2022-06-15 ENCOUNTER — Ambulatory Visit (INDEPENDENT_AMBULATORY_CARE_PROVIDER_SITE_OTHER): Payer: Self-pay | Admitting: Nurse Practitioner

## 2022-06-15 DIAGNOSIS — J4530 Mild persistent asthma, uncomplicated: Secondary | ICD-10-CM

## 2022-06-15 MED ORDER — IPRATROPIUM BROMIDE 0.02 % IN SOLN
0.5000 mg | Freq: Four times a day (QID) | RESPIRATORY_TRACT | 11 refills | Status: DC | PRN
  Filled 2022-06-15: qty 62.5, 7d supply, fill #0

## 2022-06-15 MED ORDER — ALBUTEROL SULFATE HFA 108 (90 BASE) MCG/ACT IN AERS
2.0000 | INHALATION_SPRAY | Freq: Four times a day (QID) | RESPIRATORY_TRACT | 2 refills | Status: DC | PRN
  Filled 2022-06-15: qty 6.7, 25d supply, fill #0

## 2022-06-15 NOTE — Patient Instructions (Signed)
1. Mild persistent asthma without complication  - albuterol (VENTOLIN HFA) 108 (90 Base) MCG/ACT inhaler; Inhale 2 puffs into the lungs every 6 (six) hours as needed for wheezing or shortness of breath.  Dispense: 8.5 g; Refill: 2 - ipratropium (ATROVENT) 0.02 % nebulizer solution; Take 2.5 mLs (0.5 mg total) by nebulization every 6 (six) hours as needed for wheezing or shortness of breath.  Dispense: 75 mL; Refill: 11   Follow up:  Follow up in 6 months or sooner if needed

## 2022-06-15 NOTE — Progress Notes (Signed)
Integrated Behavioral Health Case Management Referral Note  06/15/2022 Name: Sherri Shaw MRN: [id number] DOB: August 13, 1980 Sherri Shaw is a 41 y.o. year old female who sees Fenton Foy, NP for primary care. LCSW was consulted to assess patient's needs and assist the patient with financial difficulties due to lack of health coverage.  Interpreter: No.   Interpreter Name & Language: none  Assessment: Patient experiencing financial difficulties due to lack of health coverage.  Intervention: CSW met with patient during PCP visit. Provided patient with [employer] and [employer] applications. Reviewed application and advised patient on supporting documents to be submitted with the application. Advised patient to follow up with CSW for assistance in submitting the applications. Provided CSW contact information.   SDOH (Social Determinants of Health) assessments performed: Yes  SDOH Screenings   Food Insecurity: No Food Insecurity (02/15/2022)  Transportation Needs: No Transportation Needs (02/15/2022)  Depression (PHQ2-9): Low Risk  (12/13/2021)  Financial Resource Strain: High Risk (06/15/2022)  Tobacco Use: Low Risk  (06/15/2022)    Review of patient status, including review of consultants reports, relevant laboratory and other test results, and collaboration with appropriate care team members and the patient's provider was performed as part of comprehensive patient evaluation and provision of services.    Estanislado Emms, [facility] [phone]

## 2022-06-15 NOTE — Assessment & Plan Note (Signed)
-   albuterol (VENTOLIN HFA) 108 (90 Base) MCG/ACT inhaler; Inhale 2 puffs into the lungs every 6 (six) hours as needed for wheezing or shortness of breath.  Dispense: 8.5 g; Refill: 2 - ipratropium (ATROVENT) 0.02 % nebulizer solution; Take 2.5 mLs (0.5 mg total) by nebulization every 6 (six) hours as needed for wheezing or shortness of breath.  Dispense: 75 mL; Refill: 11   Follow up:  Follow up in 6 months or sooner if needed

## 2022-06-15 NOTE — Progress Notes (Unsigned)
Care Guide Note  06/15/2022 Name: Rikkia Kucera MRN: [id number] DOB: 12-23-1980  Referred by: Ivonne Andrew, NP Reason for referral : Care Coordination (Outreach to schedule with Pharm D )   Sulema Svetlik is a 41 y.o. year old female who is a primary care patient of Ivonne Andrew, NP. Zubaida Degrandis was referred to the pharmacist for assistance related to DM.    An unsuccessful telephone outreach was attempted today to contact the patient who was referred to the pharmacy team for assistance with medication management. Additional attempts will be made to contact the patient.   Penne Lash, RMA Care Guide [facility] [place], [place] [address] Direct Dial: [phone] [email or web address]

## 2022-06-15 NOTE — Progress Notes (Signed)
$'@Patient'j$  ID: Sherri Shaw, female    DOB: Jul 09, 1981, 41 y.o.   MRN: [id number]  Chief Complaint  Patient presents with   Follow-up    Referring provider: Fenton Foy, NP   HPI  41 year old female with history of asthma schizophrenia, anemia, bipolar.  Patient presents today for routine follow-up.  She states that her asthma has been under control lately.  He has not needed her inhaler but states that she does not have it because she cannot afford it.  We will give her paperwork for financial assistance.  We will also consult with pharmacy to see if we can get the medication for her.  Patient does not currently have a job or insurance.  She is living with her sister.  She is having a hard time paying for groceries.  We will give her some food today from the food bank. Denies f/c/s, n/v/d, hemoptysis, PND, leg swelling Denies chest pain or edema     No Known Allergies  Immunization History  Administered Date(s) Administered   Influenza,inj,Quad PF,6+ Mos 09/04/2014, 08/02/2017, 05/09/2018, 05/13/2020   PFIZER(Purple Top)SARS-COV-2 Vaccination 11/07/2019, 12/02/2019   Pneumococcal Polysaccharide-23 04/08/2013, 09/04/2014   Tdap 08/15/2017    Past Medical History:  Diagnosis Date   Anemia    Asthma    Bipolar disorder ([place])    sister denies this hx on 04/07/2013   Chronic bronchitis ([place])    "I get it q yr" (04/07/2013)   Exertional shortness of breath    ? due to anemia   History of blood transfusion 04/07/2013   "got my first transfusion today; got 4 units" (04/07/2013)   Microcytic anemia    Archie Endo 04/07/2013   Schizophrenia ([place])    "don't really know the type"/sister (04/07/2013)    Tobacco History: Social History   Tobacco Use  Smoking Status Never  Smokeless Tobacco Never   Counseling given: Not Answered   Outpatient Encounter Medications as of 06/15/2022  Medication Sig   [DISCONTINUED] albuterol (VENTOLIN HFA) 108 (90 Base) MCG/ACT inhaler Inhale 2 puffs  into the lungs every 6 (six) hours as needed for wheezing or shortness of breath.   [DISCONTINUED] ipratropium (ATROVENT) 0.02 % nebulizer solution Take 2.5 mLs (0.5 mg total) by nebulization every 6 (six) hours as needed for wheezing or shortness of breath.   albuterol (VENTOLIN HFA) 108 (90 Base) MCG/ACT inhaler Inhale 2 puffs into the lungs every 6 (six) hours as needed for wheezing or shortness of breath.   ipratropium (ATROVENT) 0.02 % nebulizer solution Take 2.5 mLs (0.5 mg total) by nebulization every 6 (six) hours as needed for wheezing or shortness of breath.   No facility-administered encounter medications on file as of 06/15/2022.     Review of Systems  Review of Systems  Constitutional: Negative.   HENT: Negative.    Cardiovascular: Negative.   Gastrointestinal: Negative.   Allergic/Immunologic: Negative.   Neurological: Negative.   Psychiatric/Behavioral: Negative.         Physical Exam  BP 113/65   Temp 97.9 F (36.6 C)   Ht '4\' 10"'$  (1.473 m)   Wt 174 lb (78.9 kg)   LMP 05/25/2013 (LMP Unknown)   SpO2 97%   BMI 36.37 kg/m   Wt Readings from Last 5 Encounters:  06/15/22 174 lb (78.9 kg)  02/15/22 165 lb 14.4 oz (75.3 kg)  12/13/21 189 lb 6.4 oz (85.9 kg)  06/14/21 189 lb 0.6 oz (85.7 kg)  11/11/20 163 lb (73.9 kg)  Physical Exam Vitals and nursing note reviewed.  Constitutional:      General: She is not in acute distress.    Appearance: She is well-developed.  Cardiovascular:     Rate and Rhythm: Normal rate and regular rhythm.  Pulmonary:     Effort: Pulmonary effort is normal.     Breath sounds: Normal breath sounds.  Neurological:     Mental Status: She is alert and oriented to person, place, and time.      Lab Results:  CBC    Component Value Date/Time   WBC 6.4 12/13/2021 0944   WBC 8.0 10/13/2018 2136   RBC 4.18 12/13/2021 0944   RBC 5.06 10/13/2018 2136   HGB 11.4 12/13/2021 0944   HCT 35.2 12/13/2021 0944   PLT 300  12/13/2021 0944   MCV 84 12/13/2021 0944   MCH 27.3 12/13/2021 0944   MCH 27.5 10/13/2018 2136   MCHC 32.4 12/13/2021 0944   MCHC 30.6 10/13/2018 2136   RDW 12.0 12/13/2021 0944   LYMPHSABS 1.7 12/10/2019 1102   MONOABS 0.5 10/13/2018 2136   EOSABS 0.3 12/10/2019 1102   BASOSABS 0.0 12/10/2019 1102    BMET    Component Value Date/Time   NA 141 12/13/2021 0944   K 3.6 12/13/2021 0944   CL 105 12/13/2021 0944   CO2 24 12/13/2021 0944   GLUCOSE 91 12/13/2021 0944   GLUCOSE 215 (H) 10/14/2018 0306   BUN 7 12/13/2021 0944   CREATININE 0.74 12/13/2021 0944   CALCIUM 8.6 (L) 12/13/2021 0944   GFRNONAA 88 12/10/2019 1102   GFRAA 101 12/10/2019 1102    BNP No results found for: "BNP"  ProBNP    Component Value Date/Time   PROBNP 131.1 (H) 04/06/2013 2322    Imaging: No results found.   Assessment & Plan:   Mild persistent asthma without complication - albuterol (VENTOLIN HFA) 108 (90 Base) MCG/ACT inhaler; Inhale 2 puffs into the lungs every 6 (six) hours as needed for wheezing or shortness of breath.  Dispense: 8.5 g; Refill: 2 - ipratropium (ATROVENT) 0.02 % nebulizer solution; Take 2.5 mLs (0.5 mg total) by nebulization every 6 (six) hours as needed for wheezing or shortness of breath.  Dispense: 75 mL; Refill: 11   Follow up:  Follow up in 6 months or sooner if needed     Fenton Foy, NP 06/15/2022

## 2022-06-18 ENCOUNTER — Other Ambulatory Visit: Payer: Self-pay

## 2022-06-19 NOTE — Progress Notes (Signed)
Care Guide Note  06/19/2022 Name: [name] MRN: [id number] DOB: [birth year]  Referred by: Ivonne Andrew, NP Reason for referral : Care Coordination (Outreach to schedule with Pharm D )   Sherri Shaw is a 41 y.o. year old female who is a primary care patient of Ivonne Andrew, NP. Deby Perrier was referred to the pharmacist for assistance related to COPD and DM.    Successful contact was made with the patient to discuss pharmacy services including being ready for the pharmacist to call at least 5 minutes before the scheduled appointment time, to have medication bottles and any blood sugar or blood pressure readings ready for review. The patient agreed to meet with the pharmacist via with the pharmacist via telephone visit on 07/05/2022    Penne Lash, RMA Care Guide [facility]  [place], [place] [address] Direct Dial: [phone] [email or web address]

## 2022-06-22 ENCOUNTER — Other Ambulatory Visit: Payer: Self-pay

## 2022-07-05 ENCOUNTER — Other Ambulatory Visit: Payer: No Typology Code available for payment source | Admitting: Pharmacist

## 2022-07-05 ENCOUNTER — Telehealth: Payer: Self-pay | Admitting: Pharmacist

## 2022-07-05 NOTE — Progress Notes (Signed)
Attempted to contact patient for scheduled appointment for medication management. Left HIPAA compliant message for patient to return my call at their convenience.   Catie T. Aaliayah Miao, PharmD, BCACP  Medical Group [phone]  

## 2022-07-11 ENCOUNTER — Telehealth: Payer: Self-pay

## 2022-07-11 NOTE — Progress Notes (Signed)
Care Guide Note  07/11/2022 Name: Sherri Shaw MRN: [id number] DOB: 1981/02/21  Referred by: Ivonne Andrew, NP Reason for referral : Care Coordination (Outreach to reschedule with Pharm D )   Sherri Shaw is a 41 y.o. year old female who is a primary care patient of Ivonne Andrew, NP. Sherri Shaw was referred to the pharmacist for assistance related to DM.    An unsuccessful telephone outreach was attempted today to contact the patient who was referred to the pharmacy team for assistance with medication management. Additional attempts will be made to contact the patient.   Sherri Shaw, RMA Care Guide [facility]  [place], [place] [address] Direct Dial: [phone] [email or web address]

## 2022-07-16 NOTE — Progress Notes (Signed)
Chronic Care Management   Note  07/16/2022 Name: Sherri Shaw MRN: [id number] DOB: 12-26-1980  Sherri Shaw is a 41 y.o. year old female who is a primary care patient of Ivonne Andrew, NP. I reached out to Virgina Jock by phone today in response to a referral sent by Ms. Kerry Fort PCP.  Ms. Anaid Guth was not successfully contacted today. A HIPAA compliant voice message was left requesting a return call.   Follow up plan: Additional outreach attempts will be made.  Penne Lash, RMA Care Guide [facility]  [place], [place] [address] Direct Dial: [phone] [email or web address]

## 2022-07-25 NOTE — Progress Notes (Signed)
Care Guide Note  07/25/2022 Name: Sherri Shaw MRN: [id number] DOB: 1981/01/25  Referred by: Ivonne Andrew, NP Reason for referral : Care Coordination (Outreach to reschedule with Pharm D )   Sherri Shaw is a 41 y.o. year old female who is a primary care patient of Ivonne Andrew, NP. Sherri Shaw was referred to the pharmacist for assistance related to DM.    A third unsuccessful telephone outreach was attempted today to contact the patient who was referred to the pharmacy team for assistance with medication management. The Population Health team is pleased to engage with this patient at any time in the future upon receipt of referral and should he/she be interested in assistance from the [facility] team.   Sherri Shaw, Sherri Shaw Care Guide [facility]  [place], [place] [address] Direct Dial: [phone] [email or web address]

## 2022-12-14 ENCOUNTER — Ambulatory Visit: Payer: Self-pay | Admitting: Nurse Practitioner

## 2023-07-11 ENCOUNTER — Other Ambulatory Visit: Payer: Self-pay | Admitting: Obstetrics and Gynecology

## 2023-07-11 DIAGNOSIS — Z1231 Encounter for screening mammogram for malignant neoplasm of breast: Secondary | ICD-10-CM

## 2023-09-12 ENCOUNTER — Ambulatory Visit: Payer: Self-pay | Admitting: *Deleted

## 2023-09-12 ENCOUNTER — Ambulatory Visit
Admission: RE | Admit: 2023-09-12 | Discharge: 2023-09-12 | Disposition: A | Discharge status: Home / Self Care | Payer: No Typology Code available for payment source | Source: Ambulatory Visit | Attending: Obstetrics and Gynecology | Admitting: Obstetrics and Gynecology

## 2023-09-12 VITALS — BP 90/60 | Wt 129.9 lb

## 2023-09-12 DIAGNOSIS — Z1231 Encounter for screening mammogram for malignant neoplasm of breast: Secondary | ICD-10-CM

## 2023-09-12 DIAGNOSIS — Z1239 Encounter for other screening for malignant neoplasm of breast: Secondary | ICD-10-CM

## 2023-09-12 DIAGNOSIS — Z1211 Encounter for screening for malignant neoplasm of colon: Secondary | ICD-10-CM

## 2023-09-12 NOTE — Patient Instructions (Signed)
 Explained breast self awareness with Sherri Shaw. Patient did not need a Pap smear today due to patient has a history of a hysterectomy for benign reasons. Let patient know that she doesn't need any further Pap smears due to her history of a hysterectomy for benign reasons. [facility] to [facility] for a screening mammogram on mobile unit. Appointment scheduled Thursday, September 12, 2023 at 1110. Patient aware of appointment and will be there. Let patient know the Breast Center will follow up with her within the next couple weeks with results of mammogram by letter or phone. Sherri Shaw verbalized understanding.  Ruthie Berch, Wanda Ship, RN 11:02 AM

## 2023-09-12 NOTE — Progress Notes (Addendum)
 [name] is a 43 y.o. female who presents to [facility] clinic today with no complaints.    Pap Smear: Pap smear not completed today. Last Pap smear was 06/14/2021 at the [facility] clinic and was normal. Per patient has no history of an abnormal Pap smear. Patient has a history of a hysterectomy 06/01/2013 due to fibroids. Patient doesn't need any further Pap smears due to her history of a hysterectomy for benign reasons per BCCCP and ASCCCP guidelines. Last Pap smear result is not available in Epic.    Physical exam: Breasts Right breast slightly larger than left breast that per patient is normal for her. No skin abnormalities bilateral breasts. No nipple retraction bilateral breasts. No nipple discharge bilateral breasts. No lymphadenopathy. No lumps palpated bilateral breasts. No complaints of pain or tenderness on exam.     MS DIGITAL DIAG UNI RIGHT Result Date: 02/22/2022 CLINICAL DATA:  43 year old female for further evaluation of possible RIGHT breast asymmetry on baseline screening mammogram. EXAM: DIGITAL DIAGNOSTIC UNILATERAL RIGHT MAMMOGRAM TECHNIQUE: Right digital diagnostic mammography was performed. Mammographic images were processed with CAD. COMPARISON:  Previous exam(s). ACR Breast Density Category b: There are scattered areas of fibroglandular density. FINDINGS: 2D/3D spot compression views of the RIGHT breast demonstrate dispersal of the screening study asymmetry without persistent suspicious abnormality in this area. IMPRESSION: No persistent suspicious abnormality at the site of the screening study finding. RECOMMENDATION: Bilateral screening mammogram in 1 year. I have discussed the findings and recommendations with the patient. If applicable, a reminder letter will be sent to the patient regarding the next appointment. BI-RADS CATEGORY  1: Negative. Electronically Signed   By: Reyes Phi M.D.   On: 02/22/2022 10:33  MM 3D SCREEN BREAST BILATERAL Result  Date: 01/26/2022 CLINICAL DATA:  Screening. Baseline examination. EXAM: DIGITAL SCREENING BILATERAL MAMMOGRAM WITH TOMOSYNTHESIS AND CAD TECHNIQUE: Bilateral screening digital craniocaudal and mediolateral oblique mammograms were obtained. Bilateral screening digital breast tomosynthesis was performed. The images were evaluated with computer-aided detection. COMPARISON:  None ACR Breast Density Category b: There are scattered areas of fibroglandular density. FINDINGS: In the right breast, a possible focal asymmetry within the posterior OUTER breast warrants further evaluation. In the left breast, no findings suspicious for malignancy. IMPRESSION: Further evaluation is suggested for possible focal asymmetry in the right breast. RECOMMENDATION: Diagnostic mammogram and possibly ultrasound of the right breast. (Code:[id number]) The patient will be contacted regarding the findings, and additional imaging will be scheduled. BI-RADS CATEGORY  0: Incomplete. Need additional imaging evaluation and/or prior mammograms for comparison. Electronically Signed   By: Reyes Phi M.D.   On: 01/26/2022 12:40    Pelvic/Bimanual Pap is not indicated today per BCCCP guidelines.   Smoking History: Patient has never smoked.  Patient Navigation: Patient education provided. Access to services provided for patient through [facility] program. Patient has food insecurities. Patient escorted to the food market at the [facility] for groceries.  Colorectal Cancer Screening: Per patient has never had colonoscopy completed. FIT Test given to patient to complete. No complaints today.    Breast and Cervical Cancer Risk Assessment: Patient has family history of maternal great grandmother having breast cancer. Patient has no known genetic mutations or history of radiation treatment to the chest before age 45. Patient does not have history of cervical dysplasia, immunocompromised, or DES exposure in-utero.   Risk Scores as  of Encounter on 09/12/2023     Alisa  5-year 0.88%   Lifetime 9.28%            Last calculated by Driscilla Wanda SQUIBB, RN on 09/12/2023 at  1:11 PM       A: BCCCP exam without pap smear No complaints.  P: Referred patient to the [facility] for a screening mammogram on mobile unit. Appointment scheduled Thursday, September 12, 2023 at 1110.  Driscilla Wanda SQUIBB, RN 09/12/2023 11:02 AM

## 2024-01-10 ENCOUNTER — Other Ambulatory Visit: Payer: Self-pay

## 2024-01-10 ENCOUNTER — Emergency Department (HOSPITAL_COMMUNITY)

## 2024-01-10 ENCOUNTER — Emergency Department (HOSPITAL_COMMUNITY)
Admission: EM | Admit: 2024-01-10 | Discharge: 2024-01-10 | Disposition: A | Discharge status: Home / Self Care | Attending: Emergency Medicine | Admitting: Emergency Medicine

## 2024-01-10 ENCOUNTER — Encounter (HOSPITAL_COMMUNITY): Payer: Self-pay | Admitting: *Deleted

## 2024-01-10 DIAGNOSIS — R55 Syncope and collapse: Secondary | ICD-10-CM | POA: Insufficient documentation

## 2024-01-10 LAB — URINALYSIS, W/ REFLEX TO CULTURE (INFECTION SUSPECTED)
Bilirubin Urine: NEGATIVE
Glucose, UA: NEGATIVE mg/dL
Hgb urine dipstick: NEGATIVE
Ketones, ur: NEGATIVE mg/dL
Leukocytes,Ua: NEGATIVE
Nitrite: NEGATIVE
Protein, ur: 30 mg/dL — AB
Specific Gravity, Urine: 1.013 (ref 1.005–1.030)
pH: 7 (ref 5.0–8.0)

## 2024-01-10 LAB — CBC WITH DIFFERENTIAL/PLATELET
Abs Immature Granulocytes: 0.04 10*3/uL (ref 0.00–0.07)
Basophils Absolute: 0 10*3/uL (ref 0.0–0.1)
Basophils Relative: 0 %
Eosinophils Absolute: 0.1 10*3/uL (ref 0.0–0.5)
Eosinophils Relative: 0 %
HCT: 38.4 % (ref 36.0–46.0)
Hemoglobin: 12.2 g/dL (ref 12.0–15.0)
Immature Granulocytes: 0 %
Lymphocytes Relative: 5 %
Lymphs Abs: 0.8 10*3/uL (ref 0.7–4.0)
MCH: 28.6 pg (ref 26.0–34.0)
MCHC: 31.8 g/dL (ref 30.0–36.0)
MCV: 89.9 fL (ref 80.0–100.0)
Monocytes Absolute: 0.5 10*3/uL (ref 0.1–1.0)
Monocytes Relative: 3 %
Neutro Abs: 15 10*3/uL — ABNORMAL HIGH (ref 1.7–7.7)
Neutrophils Relative %: 92 %
Platelets: 292 10*3/uL (ref 150–400)
RBC: 4.27 MIL/uL (ref 3.87–5.11)
RDW: 12.6 % (ref 11.5–15.5)
WBC: 16.4 10*3/uL — ABNORMAL HIGH (ref 4.0–10.5)
nRBC: 0 % (ref 0.0–0.2)

## 2024-01-10 LAB — ETHANOL: Alcohol, Ethyl (B): <15 mg/dL (ref <15)

## 2024-01-10 LAB — COMPREHENSIVE METABOLIC PANEL WITH GFR
ALT: 8 U/L (ref 0–44)
AST: 16 U/L (ref 15–41)
Albumin: 3.9 g/dL (ref 3.5–5.0)
Alkaline Phosphatase: 62 U/L (ref 38–126)
Anion gap: 8 (ref 5–15)
BUN: 9 mg/dL (ref 6–20)
CO2: 25 mmol/L (ref 22–32)
Calcium: 8.6 mg/dL — ABNORMAL LOW (ref 8.9–10.3)
Chloride: 102 mmol/L (ref 98–111)
Creatinine, Ser: 0.89 mg/dL (ref 0.44–1.00)
GFR, Estimated: >60 mL/min (ref >60)
Glucose, Bld: 113 mg/dL — ABNORMAL HIGH (ref 70–99)
Potassium: 3.3 mmol/L — ABNORMAL LOW (ref 3.5–5.1)
Sodium: 135 mmol/L (ref 135–145)
Total Bilirubin: 0.8 mg/dL (ref 0.0–1.2)
Total Protein: 7.6 g/dL (ref 6.5–8.1)

## 2024-01-10 LAB — RAPID URINE DRUG SCREEN, HOSP PERFORMED
Amphetamines: NOT DETECTED
Barbiturates: NOT DETECTED
Benzodiazepines: NOT DETECTED
Cocaine: NOT DETECTED
Opiates: NOT DETECTED
Tetrahydrocannabinol: NOT DETECTED

## 2024-01-10 LAB — HCG, SERUM, QUALITATIVE: Preg, Serum: NEGATIVE

## 2024-01-10 MED ORDER — POTASSIUM CHLORIDE CRYS ER 20 MEQ PO TBCR
40.0000 meq | EXTENDED_RELEASE_TABLET | Freq: Once | ORAL | Status: AC
  Administered 2024-01-10: 40 meq via ORAL
  Filled 2024-01-10: qty 2

## 2024-01-10 MED ORDER — SODIUM CHLORIDE 0.9 % IV BOLUS
1000.0000 mL | Freq: Once | INTRAVENOUS | Status: AC
  Administered 2024-01-10: 1000 mL via INTRAVENOUS

## 2024-01-10 NOTE — ED Provider Notes (Signed)
 [place] EMERGENCY DEPARTMENT AT [facility] Provider Note   CSN: [id number] Arrival date & time: 01/10/24  1638     History  Chief Complaint  Patient presents with   Hypotension    Sherri Shaw is a 43 y.o. female.  43 year old female with prior medical history as detailed below presents for evaluation.  Patient reports that she was walking to the store.  Ambient temperatures outside were about 90 degrees.  While walking the patient became overheated and felt like she might pass out.  She did not actually pass out.  On arrival she is feeling improved after being in an air conditioner environment.  She denies associated chest pain, shortness of breath, nausea, vomiting, other complaint.  The history is provided by the patient.       Home Medications Prior to Admission medications   Medication Sig Start Date End Date Taking? Authorizing Provider  albuterol  (VENTOLIN  HFA) 108 (90 Base) MCG/ACT inhaler Inhale 2 puffs into the lungs every 6 (six) hours as needed for wheezing or shortness of breath. Patient not taking: Reported on 09/12/2023 06/15/22   Nichols, Tonya S, NP  ipratropium (ATROVENT ) 0.02 % nebulizer solution Take 2.5 mLs (0.5 mg total) by nebulization every 6 (six) hours as needed for wheezing or shortness of breath. Patient not taking: Reported on 09/12/2023 06/15/22   Jerrlyn Morel, NP      Allergies    Patient has no known allergies.    Review of Systems   Review of Systems  All other systems reviewed and are negative.   Physical Exam Updated Vital Signs LMP 05/25/2013 (LMP Unknown)  Physical Exam Vitals and nursing note reviewed.  Constitutional:      General: She is not in acute distress.    Appearance: Normal appearance. She is well-developed.  HENT:     Head: Normocephalic and atraumatic.  Eyes:     Conjunctiva/sclera: Conjunctivae normal.     Pupils: Pupils are equal, round, and reactive to light.  Cardiovascular:     Rate and  Rhythm: Normal rate and regular rhythm.     Heart sounds: Normal heart sounds.  Pulmonary:     Effort: Pulmonary effort is normal. No respiratory distress.     Breath sounds: Normal breath sounds.  Abdominal:     General: There is no distension.     Palpations: Abdomen is soft.     Tenderness: There is no abdominal tenderness.  Musculoskeletal:        General: No deformity. Normal range of motion.     Cervical back: Normal range of motion and neck supple.  Skin:    General: Skin is warm and dry.  Neurological:     General: No focal deficit present.     Mental Status: She is alert and oriented to person, place, and time.     ED Results / Procedures / Treatments   Labs (all labs ordered are listed, but only abnormal results are displayed) Labs Reviewed - No data to display  EKG None  Radiology No results found.  Procedures Procedures    Medications Ordered in ED Medications  sodium chloride  0.9 % bolus 1,000 mL (has no administration in time range)    ED Course/ Medical Decision Making/ A&P                                 Medical Decision Making Amount and/or Complexity of Data Reviewed  Labs: ordered. Radiology: ordered.  Risk Prescription drug management.    Medical Screen Complete  This patient presented to the ED with complaint of weakness, heat exposure.  This complaint involves an extensive number of treatment options. The initial differential diagnosis includes, but is not limited to, metabolic abnormality, dehydration, heat exposure, etc.  This presentation is: Acute, Self-Limited, Previously Undiagnosed, Uncertain Prognosis, Complicated, and Systemic Symptoms  Patient is presenting after walking to the store and near 90 degree sunny temperatures.  She became overheated.  She felt like she might pass out.  On arrival she feels improved.  Patient given IV fluids.    Screening labs demonstrate mild hypokalemia.  Potassium is repleted.  White  count is 16.  Patient without evidence of infection on workup.  Chest x-ray is clear.  UA is clear.  Patient is much improved after ED evaluation.  She is discharged home in the company of her sister.  Importance of close follow-up is stressed.  Strict return precautions given and understood.   Additional history obtained:  External records from outside sources obtained and reviewed including prior ED visits and prior Inpatient records.    Problem List / ED Course:  Near syncope, heat exposure   Reevaluation:  After the interventions noted above, I reevaluated the patient and found that they have: improved Disposition:  After consideration of the diagnostic results and the patients response to treatment, I feel that the patent would benefit from close outpatient follow-up.          Final Clinical Impression(s) / ED Diagnoses Final diagnoses:  Syncope, unspecified syncope type    Rx / DC Orders ED Discharge Orders     None         Burnette Carte, MD 01/10/24 2148

## 2024-01-10 NOTE — ED Notes (Signed)
 No orthostatic changes noted upon standing. Pt denied any dizziness/light headiness

## 2024-01-10 NOTE — ED Triage Notes (Signed)
 BIB EMS due to near syncope walking down street. Pt did not actually pass out, can recall all events. 80/50-60-99% RA-CBG 147  EKG unremarkable.

## 2024-01-10 NOTE — Discharge Instructions (Addendum)
 Return for any problem.  ?

## 2024-01-13 ENCOUNTER — Telehealth: Payer: Self-pay

## 2024-01-13 NOTE — Transitions of Care (Post Inpatient/ED Visit) (Signed)
   01/13/2024  Name: Sherri Shaw MRN: [id number] DOB: [birth year]  Today's TOC FU Call Status: Today's TOC FU Call Status:: Unsuccessful Call (1st Attempt) Unsuccessful Call (1st Attempt) Date: 01/13/24  Attempted to reach the patient regarding the most recent Inpatient/ED visit.  Follow Up Plan: Additional outreach attempts will be made to reach the patient to complete the Transitions of Care (Post Inpatient/ED visit) call.   Signature  [employer], [place]

## 2024-01-14 ENCOUNTER — Telehealth: Payer: Self-pay

## 2024-01-14 NOTE — Transitions of Care (Post Inpatient/ED Visit) (Signed)
   01/14/2024  Name: Sherri Shaw MRN: [id number] DOB: 03/07/1981  Today's TOC FU Call Status: Today's TOC FU Call Status:: Unsuccessful Call (2nd Attempt) Unsuccessful Call (2nd Attempt) Date: 01/14/24  Attempted to reach the patient regarding the most recent Inpatient/ED visit.  Follow Up Plan: Additional outreach attempts will be made to reach the patient to complete the Transitions of Care (Post Inpatient/ED visit) call.   Signature  [employer], [place]

## 2024-01-16 ENCOUNTER — Telehealth: Payer: Self-pay

## 2024-01-16 NOTE — Transitions of Care (Post Inpatient/ED Visit) (Signed)
   01/16/2024  Name: Airika Alkhatib MRN: [id number] DOB: 17-Jan-1981  Today's TOC FU Call Status: Today's TOC FU Call Status:: Unsuccessful Call (3rd Attempt) Unsuccessful Call (3rd Attempt) Date: 01/16/24  Attempted to reach the patient regarding the most recent Inpatient/ED visit.  Follow Up Plan: No further outreach attempts will be made at this time. We have been unable to contact the patient.  Signature  [employer], [place]

## 2024-01-22 ENCOUNTER — Other Ambulatory Visit: Payer: Self-pay

## 2024-01-22 ENCOUNTER — Ambulatory Visit (INDEPENDENT_AMBULATORY_CARE_PROVIDER_SITE_OTHER): Payer: Self-pay | Admitting: Nurse Practitioner

## 2024-01-22 ENCOUNTER — Encounter: Payer: Self-pay | Admitting: Nurse Practitioner

## 2024-01-22 VITALS — BP 115/77 | HR 62 | Temp 98.2°F | Wt 124.0 lb

## 2024-01-22 DIAGNOSIS — Z139 Encounter for screening, unspecified: Secondary | ICD-10-CM

## 2024-01-22 DIAGNOSIS — E876 Hypokalemia: Secondary | ICD-10-CM

## 2024-01-22 DIAGNOSIS — J4530 Mild persistent asthma, uncomplicated: Secondary | ICD-10-CM

## 2024-01-22 DIAGNOSIS — R55 Syncope and collapse: Secondary | ICD-10-CM

## 2024-01-22 MED ORDER — ALBUTEROL SULFATE HFA 108 (90 BASE) MCG/ACT IN AERS
2.0000 | INHALATION_SPRAY | Freq: Four times a day (QID) | RESPIRATORY_TRACT | 2 refills | Status: AC | PRN
Start: 2024-01-22 — End: ?
  Filled 2024-01-22: qty 6.7, 25d supply, fill #0

## 2024-01-22 MED ORDER — IPRATROPIUM BROMIDE 0.02 % IN SOLN
0.5000 mg | Freq: Four times a day (QID) | RESPIRATORY_TRACT | 11 refills | Status: AC | PRN
  Filled 2024-01-22: qty 62.5, 7d supply, fill #0
  Filled 2024-01-23: qty 75, 8d supply, fill #0

## 2024-01-22 NOTE — Progress Notes (Signed)
 Subjective   Patient ID: Sherri Shaw, female    DOB: 11-08-1980, 43 y.o.   MRN: [id number]  Chief Complaint  Patient presents with   Hospitalization Follow-up    Referring provider: Jerrlyn Morel, NP  Sherri Shaw is a 43 y.o. female with Past Medical History: No date: Anemia No date: Asthma No date: Bipolar disorder [facility])     Comment:  sister denies this hx on 04/07/2013 No date: Chronic bronchitis (HCC)     Comment:  I get it q yr (04/07/2013) No date: Exertional shortness of breath     Comment:  ? due to anemia 04/07/2013: History of blood transfusion     Comment:  got my first transfusion today; got 4 units (04/07/2013) No date: Microcytic anemia     Comment:  Sherri Shaw 04/07/2013 No date: Schizophrenia [facility])     Comment:  don't really know the type/sister (04/07/2013)   HPI  Patient presents today for a hospital follow-up.  She was recently seen in the hospital in the emergency room for a syncopal episode.  She states that she did not have anything to eat or drink that day and was walking in passed out.  Her potassium was low.  She did get replacement and fluids in the ED.  We will recheck labs today.  Patient is much improved.  She is having issues with financial strain, food insecurity, housing insecurity.  We will place a referral for social worker. Denies f/c/s, n/v/d, hemoptysis, PND, leg swelling Denies chest pain or edema     No Known Allergies  Immunization History  Administered Date(s) Administered   Influenza,inj,Quad PF,6+ Mos 09/04/2014, 08/02/2017, 05/09/2018, 05/13/2020   PFIZER(Purple Top)SARS-COV-2 Vaccination 11/07/2019, 12/02/2019   Pneumococcal Polysaccharide-23 04/08/2013, 09/04/2014   Tdap 08/15/2017    Tobacco History: Social History   Tobacco Use  Smoking Status Never  Smokeless Tobacco Never   Counseling given: Not Answered   Outpatient Encounter Medications as of 01/22/2024  Medication Sig   [DISCONTINUED] albuterol  (VENTOLIN  HFA)  108 (90 Base) MCG/ACT inhaler Inhale 2 puffs into the lungs every 6 (six) hours as needed for wheezing or shortness of breath.   [DISCONTINUED] ipratropium (ATROVENT ) 0.02 % nebulizer solution Take 2.5 mLs (0.5 mg total) by nebulization every 6 (six) hours as needed for wheezing or shortness of breath.   albuterol  (VENTOLIN  HFA) 108 (90 Base) MCG/ACT inhaler Inhale 2 puffs into the lungs every 6 (six) hours as needed for wheezing or shortness of breath.   ipratropium (ATROVENT ) 0.02 % nebulizer solution Take 2.5 mLs (0.5 mg total) by nebulization every 6 (six) hours as needed for wheezing or shortness of breath.   No facility-administered encounter medications on file as of 01/22/2024.    Review of Systems  Review of Systems  Constitutional: Negative.   HENT: Negative.    Cardiovascular: Negative.   Gastrointestinal: Negative.   Allergic/Immunologic: Negative.   Neurological: Negative.   Psychiatric/Behavioral: Negative.       Objective:   BP 115/77   Pulse 62   Temp 98.2 F (36.8 C) (Oral)   Wt 124 lb (56.2 kg)   LMP 05/25/2013 (LMP Unknown)   SpO2 100%   BMI 25.92 kg/m   Wt Readings from Last 5 Encounters:  01/22/24 124 lb (56.2 kg)  01/10/24 129 lb 13.6 oz (58.9 kg)  09/12/23 129 lb 14.4 oz (58.9 kg)  06/15/22 174 lb (78.9 kg)  02/15/22 165 lb 14.4 oz (75.3 kg)     Physical Exam Vitals and  nursing note reviewed.  Constitutional:      General: She is not in acute distress.    Appearance: She is well-developed.   Cardiovascular:     Rate and Rhythm: Normal rate and regular rhythm.  Pulmonary:     Effort: Pulmonary effort is normal.     Breath sounds: Normal breath sounds.   Neurological:     Mental Status: She is alert and oriented to person, place, and time.       Assessment & Plan:   Hypokalemia -     Comprehensive metabolic panel with GFR -     CBC  Syncope, unspecified syncope type -     CBC -     TSH  Mild persistent asthma without  complication -     Albuterol  Sulfate HFA; Inhale 2 puffs into the lungs every 6 (six) hours as needed for wheezing or shortness of breath.  Dispense: 6.7 g; Refill: 2 -     Ipratropium Bromide ; Take 2.5 mLs (0.5 mg total) by nebulization every 6 (six) hours as needed for wheezing or shortness of breath.  Dispense: 62.5 mL; Refill: 11  Encounter for screening involving social determinants of health (SDoH) -     AMB Referral [facility] Care Management     Return in about 3 months (around 04/23/2024).   Sherri Morel, NP 01/22/2024

## 2024-01-23 ENCOUNTER — Ambulatory Visit: Payer: Self-pay | Admitting: Nurse Practitioner

## 2024-01-23 ENCOUNTER — Telehealth: Payer: Self-pay

## 2024-01-23 ENCOUNTER — Other Ambulatory Visit: Payer: Self-pay

## 2024-01-23 LAB — CBC
Hematocrit: 37.7 % (ref 34.0–46.6)
Hemoglobin: 11.5 g/dL (ref 11.1–15.9)
MCH: 28 pg (ref 26.6–33.0)
MCHC: 30.5 g/dL — ABNORMAL LOW (ref 31.5–35.7)
MCV: 92 fL (ref 79–97)
Platelets: 281 10*3/uL (ref 150–450)
RBC: 4.11 x10E6/uL (ref 3.77–5.28)
RDW: 12 % (ref 11.7–15.4)
WBC: 6.2 10*3/uL (ref 3.4–10.8)

## 2024-01-23 LAB — COMPREHENSIVE METABOLIC PANEL WITH GFR
ALT: 7 IU/L (ref 0–32)
AST: 18 IU/L (ref 0–40)
Albumin: 4 g/dL (ref 3.9–4.9)
Alkaline Phosphatase: 69 IU/L (ref 44–121)
BUN/Creatinine Ratio: 7 — ABNORMAL LOW (ref 9–23)
BUN: 5 mg/dL — ABNORMAL LOW (ref 6–24)
Bilirubin Total: 0.3 mg/dL (ref 0.0–1.2)
CO2: 17 mmol/L — ABNORMAL LOW (ref 20–29)
Calcium: 8.8 mg/dL (ref 8.7–10.2)
Chloride: 106 mmol/L (ref 96–106)
Creatinine, Ser: 0.67 mg/dL (ref 0.57–1.00)
Globulin, Total: 3 g/dL (ref 1.5–4.5)
Glucose: 76 mg/dL (ref 70–99)
Potassium: 3.8 mmol/L (ref 3.5–5.2)
Sodium: 141 mmol/L (ref 134–144)
Total Protein: 7 g/dL (ref 6.0–8.5)
eGFR: 110 mL/min/{1.73_m2} (ref >59)

## 2024-01-23 LAB — TSH: TSH: 3.8 u[IU]/mL (ref 0.450–4.500)

## 2024-01-23 NOTE — Telephone Encounter (Signed)
 Lvm with Shelvy Dickens number for call back. KH

## 2024-01-23 NOTE — Progress Notes (Signed)
   Telephone encounter was:  Successful.  Complex Care Management Note Care Guide Note  01/23/2024 Name: Sherri Shaw MRN: [id number] DOB: 15-May-1981  Sherri Shaw is a 43 y.o. year old female who is a primary care patient of Jerrlyn Morel, NP . The community resource team was consulted for assistance with Food Insecurity and Financial Difficulties related to financial strain  SDOH screenings and interventions completed:  Yes  Social Drivers of Health From This Encounter   Food Insecurity: Food Insecurity Present (01/23/2024)   Hunger Vital Sign    Worried About Running Out of Food in the Last Year: Often true    Ran Out of Food in the Last Year: Often true  Financial Resource Strain: High Risk (01/23/2024)   Overall Financial Resource Strain (CARDIA)    Difficulty of Paying Living Expenses: Very hard    SDOH Interventions Today    Flowsheet Row Most Recent Value  SDOH Interventions   Food Insecurity Interventions Community Resources Provided  Financial Strain Interventions Community Resources Provided, [id number] Referral     Care guide performed the following interventions: Patient provided with information about care guide support team and interviewed to confirm resource needs.Pt is having financial strain and cant afford to buy food. I will be mailing Community resources to the Pt. Pt delined call half way through the screening. I called back with no answer for the 2nd outrech I will be closing referral  Follow Up Plan:  No further follow up planned at this time. The patient has been provided with needed resources.  Encounter Outcome:  Patient Visit Completed    Azell Leopard [facility]  [facility] Guide, Phone: [phone] Fax: [phone] Website: [email or web address]

## 2024-01-23 NOTE — Telephone Encounter (Unsigned)
 Copied from CRM [id number]. Topic: General - Call Back - No Documentation >> Jan 23, 2024  9:44 AM Tiffini S wrote: Reason for CRM: Patient sister Joice Nares called asking to talk with the social worker Shelvy Dickens for [employer]. Missed the call and asked for a call back at [phone].

## 2024-01-24 ENCOUNTER — Other Ambulatory Visit (HOSPITAL_BASED_OUTPATIENT_CLINIC_OR_DEPARTMENT_OTHER): Payer: Self-pay

## 2024-04-23 ENCOUNTER — Ambulatory Visit (INDEPENDENT_AMBULATORY_CARE_PROVIDER_SITE_OTHER): Payer: Self-pay | Admitting: Nurse Practitioner

## 2024-04-23 ENCOUNTER — Encounter: Payer: Self-pay | Admitting: Nurse Practitioner

## 2024-04-23 VITALS — BP 115/84 | HR 74 | Temp 98.4°F | Wt 115.4 lb

## 2024-04-23 DIAGNOSIS — J4530 Mild persistent asthma, uncomplicated: Secondary | ICD-10-CM

## 2024-04-23 NOTE — Progress Notes (Signed)
 Subjective   Patient ID: Sherri Shaw, female    DOB: [birth year], 43 y.o.   MRN: [id number]  Chief Complaint  Patient presents with   Medical Management of Chronic Issues    Follow up    Referring provider: Oley Bascom RAMAN, NP  Sherri Shaw is a 43 y.o. female with Past Medical History: No date: Anemia No date: Asthma No date: Bipolar disorder [facility])     Comment:  sister denies this hx on 04/07/2013 No date: Chronic bronchitis (HCC)     Comment:  I get it q yr (04/07/2013) No date: Exertional shortness of breath     Comment:  ? due to anemia 04/07/2013: History of blood transfusion     Comment:  got my first transfusion today; got 4 units (04/07/2013) No date: Microcytic anemia     Comment:  thelbert 04/07/2013 No date: Schizophrenia [facility])     Comment:  don't really know the type/sister (04/07/2013)  HPI   Patient presents today for follow-up visit.  Overall she is doing well.  She does not need refills on medications today.  She has been in touch with [facility] to get help with SDOH needs.  She does need food from the food pantry today.  We will give her a bag of food before she leaves today.  She states that her asthma has been well-controlled.  Denies f/c/s, n/v/d, hemoptysis, PND, leg swelling. Denies chest pain or edema.      No Known Allergies  Immunization History  Administered Date(s) Administered   Influenza,inj,Quad PF,6+ Mos 09/04/2014, 08/02/2017, 05/09/2018, 05/13/2020   PFIZER(Purple Top)SARS-COV-2 Vaccination 11/07/2019, 12/02/2019   Pneumococcal Polysaccharide-23 04/08/2013, 09/04/2014   Tdap 08/15/2017    Tobacco History: Social History   Tobacco Use  Smoking Status Never  Smokeless Tobacco Never   Counseling given: Not Answered   Outpatient Encounter Medications as of 04/23/2024  Medication Sig   albuterol  (VENTOLIN  HFA) 108 (90 Base) MCG/ACT inhaler Inhale 2 puffs into the lungs every 6 (six) hours as needed for wheezing or shortness of breath.    ipratropium (ATROVENT ) 0.02 % nebulizer solution Take 2.5 mLs (0.5 mg total) by nebulization every 6 (six) hours as needed for wheezing or shortness of breath.   No facility-administered encounter medications on file as of 04/23/2024.    Review of Systems  Review of Systems  Constitutional: Negative.   HENT: Negative.    Cardiovascular: Negative.   Gastrointestinal: Negative.   Allergic/Immunologic: Negative.   Neurological: Negative.   Psychiatric/Behavioral: Negative.       Objective:   BP 115/84   Pulse 74   Temp 98.4 F (36.9 C) (Oral)   Wt 115 lb 6.4 oz (52.3 kg)   LMP 05/25/2013 (LMP Unknown)   SpO2 100%   BMI 24.12 kg/m   Wt Readings from Last 5 Encounters:  04/23/24 115 lb 6.4 oz (52.3 kg)  01/22/24 124 lb (56.2 kg)  01/10/24 129 lb 13.6 oz (58.9 kg)  09/12/23 129 lb 14.4 oz (58.9 kg)  06/15/22 174 lb (78.9 kg)     Physical Exam Vitals and nursing note reviewed.  Constitutional:      General: She is not in acute distress.    Appearance: She is well-developed.  Cardiovascular:     Rate and Rhythm: Normal rate and regular rhythm.  Pulmonary:     Effort: Pulmonary effort is normal.     Breath sounds: Normal breath sounds.  Neurological:     Mental Status: She is alert and  oriented to person, place, and time.       Assessment & Plan:   Mild persistent asthma without complication   Continue inhaler as directed  Return in about 3 months (around 07/23/2024).   Bascom GORMAN Borer, NP 04/23/2024

## 2024-07-23 ENCOUNTER — Encounter: Payer: Self-pay | Admitting: Nurse Practitioner

## 2024-07-23 ENCOUNTER — Ambulatory Visit: Payer: Self-pay | Admitting: Nurse Practitioner

## 2024-07-23 VITALS — BP 100/64 | HR 88 | Wt 110.0 lb

## 2024-07-23 DIAGNOSIS — J4530 Mild persistent asthma, uncomplicated: Secondary | ICD-10-CM

## 2024-07-23 NOTE — Progress Notes (Signed)
 Subjective   Patient ID: Sherri Shaw, female    DOB: Feb 09, 1981, 43 y.o.   MRN: [id number]  Chief Complaint  Patient presents with   Follow-up   Asthma    Referring provider: Oley Bascom RAMAN, NP  Sherri Shaw is a 43 y.o. female with Past Medical History: No date: Anemia No date: Asthma No date: Bipolar disorder [facility])     Comment:  sister denies this hx on 04/07/2013 No date: Chronic bronchitis (HCC)     Comment:  I get it q yr (04/07/2013) No date: Exertional shortness of breath     Comment:  ? due to anemia 04/07/2013: History of blood transfusion     Comment:  got my first transfusion today; got 4 units (04/07/2013) No date: Microcytic anemia     Comment:  thelbert 04/07/2013 No date: Schizophrenia [facility])     Comment:  don't really know the type/sister (04/07/2013)   HPI  Patient presents today for follow-up on asthma.  She states that she has been stable.  She does not need refills today.  Vital signs are stable.  We will have her return in 3 months for a physical.  Patient does need food from the food pantry today. Denies f/c/s, n/v/d, hemoptysis, PND, leg swelling Denies chest pain or edema    Allergies[1]  Immunization History  Administered Date(s) Administered   Influenza,inj,Quad PF,6+ Mos 09/04/2014, 08/02/2017, 05/09/2018, 05/13/2020   PFIZER(Purple Top)SARS-COV-2 Vaccination 11/07/2019, 12/02/2019   Pneumococcal Polysaccharide-23 04/08/2013, 09/04/2014   Tdap 08/15/2017    Tobacco History: Tobacco Use History[2] Counseling given: Not Answered   Outpatient Encounter Medications as of 07/23/2024  Medication Sig   albuterol  (VENTOLIN  HFA) 108 (90 Base) MCG/ACT inhaler Inhale 2 puffs into the lungs every 6 (six) hours as needed for wheezing or shortness of breath.   ipratropium (ATROVENT ) 0.02 % nebulizer solution Take 2.5 mLs (0.5 mg total) by nebulization every 6 (six) hours as needed for wheezing or shortness of breath.   No facility-administered  encounter medications on file as of 07/23/2024.    Review of Systems  Review of Systems  Constitutional: Negative.   HENT: Negative.    Cardiovascular: Negative.   Gastrointestinal: Negative.   Allergic/Immunologic: Negative.   Neurological: Negative.   Psychiatric/Behavioral: Negative.       Objective:   BP 100/64 (BP Location: Left Arm, Patient Position: Sitting, Cuff Size: Normal)   Pulse 88   Wt 110 lb (49.9 kg)   LMP 05/25/2013   SpO2 100%   BMI 22.99 kg/m   Wt Readings from Last 5 Encounters:  07/23/24 110 lb (49.9 kg)  04/23/24 115 lb 6.4 oz (52.3 kg)  01/22/24 124 lb (56.2 kg)  01/10/24 129 lb 13.6 oz (58.9 kg)  09/12/23 129 lb 14.4 oz (58.9 kg)     Physical Exam Vitals and nursing note reviewed.  Constitutional:      General: She is not in acute distress.    Appearance: She is well-developed.  Cardiovascular:     Rate and Rhythm: Normal rate and regular rhythm.  Pulmonary:     Effort: Pulmonary effort is normal.     Breath sounds: Normal breath sounds.  Neurological:     Mental Status: She is alert and oriented to person, place, and time.       Assessment & Plan:   Mild persistent asthma without complication   stable  Return in about 3 months (around 10/21/2024) for Physical.   Bascom RAMAN Oley, NP 07/23/2024     [  1] No Known Allergies [2]  Social History Tobacco Use  Smoking Status Never  Smokeless Tobacco Never

## 2024-08-13 ENCOUNTER — Other Ambulatory Visit: Payer: Self-pay | Admitting: Obstetrics and Gynecology

## 2024-08-13 DIAGNOSIS — Z1231 Encounter for screening mammogram for malignant neoplasm of breast: Secondary | ICD-10-CM

## 2024-10-15 ENCOUNTER — Ambulatory Visit: Payer: Self-pay

## 2024-10-21 ENCOUNTER — Encounter: Payer: Self-pay | Admitting: Nurse Practitioner
